# Patient Record
Sex: Female | Born: 1935 | Race: White | Hispanic: No | Smoking: Former smoker
Health system: Southern US, Community
[De-identification: ages and names within clinical notes are randomized; demographics above are authoritative.]

## PROBLEM LIST (undated history)

## (undated) DIAGNOSIS — K219 Gastro-esophageal reflux disease without esophagitis: Secondary | ICD-10-CM

## (undated) DIAGNOSIS — H539 Unspecified visual disturbance: Secondary | ICD-10-CM

## (undated) DIAGNOSIS — D509 Iron deficiency anemia, unspecified: Secondary | ICD-10-CM

## (undated) DIAGNOSIS — F329 Major depressive disorder, single episode, unspecified: Secondary | ICD-10-CM

## (undated) DIAGNOSIS — E559 Vitamin D deficiency, unspecified: Secondary | ICD-10-CM

## (undated) DIAGNOSIS — J309 Allergic rhinitis, unspecified: Secondary | ICD-10-CM

## (undated) DIAGNOSIS — F0390 Unspecified dementia without behavioral disturbance: Secondary | ICD-10-CM

## (undated) DIAGNOSIS — K922 Gastrointestinal hemorrhage, unspecified: Secondary | ICD-10-CM

## (undated) DIAGNOSIS — J449 Chronic obstructive pulmonary disease, unspecified: Secondary | ICD-10-CM

## (undated) DIAGNOSIS — F419 Anxiety disorder, unspecified: Secondary | ICD-10-CM

## (undated) DIAGNOSIS — E539 Vitamin B deficiency, unspecified: Secondary | ICD-10-CM

## (undated) DIAGNOSIS — K2970 Gastritis, unspecified, without bleeding: Secondary | ICD-10-CM

## (undated) DIAGNOSIS — I6200 Nontraumatic subdural hemorrhage, unspecified: Secondary | ICD-10-CM

## (undated) DIAGNOSIS — F32A Depression, unspecified: Secondary | ICD-10-CM

## (undated) DIAGNOSIS — K449 Diaphragmatic hernia without obstruction or gangrene: Secondary | ICD-10-CM

## (undated) DIAGNOSIS — R42 Dizziness and giddiness: Secondary | ICD-10-CM

## (undated) DIAGNOSIS — M810 Age-related osteoporosis without current pathological fracture: Secondary | ICD-10-CM

## (undated) DIAGNOSIS — D649 Anemia, unspecified: Secondary | ICD-10-CM

## (undated) DIAGNOSIS — M1990 Unspecified osteoarthritis, unspecified site: Secondary | ICD-10-CM

## (undated) DIAGNOSIS — I10 Essential (primary) hypertension: Secondary | ICD-10-CM

## (undated) DIAGNOSIS — K649 Unspecified hemorrhoids: Secondary | ICD-10-CM

## (undated) DIAGNOSIS — K5521 Angiodysplasia of colon with hemorrhage: Secondary | ICD-10-CM

## (undated) HISTORY — DX: Allergic rhinitis, unspecified: J30.9

## (undated) HISTORY — DX: Gastritis, unspecified, without bleeding: K29.70

## (undated) HISTORY — DX: Unspecified hemorrhoids: K64.9

## (undated) HISTORY — DX: Unspecified visual disturbance: H53.9

## (undated) HISTORY — DX: Unspecified dementia, unspecified severity, without behavioral disturbance, psychotic disturbance, mood disturbance, and anxiety: F03.90

## (undated) HISTORY — DX: Gastrointestinal hemorrhage, unspecified: K92.2

## (undated) HISTORY — DX: Vitamin D deficiency, unspecified: E55.9

## (undated) HISTORY — DX: Nontraumatic subdural hemorrhage, unspecified: I62.00

## (undated) HISTORY — DX: Anxiety disorder, unspecified: F41.9

## (undated) HISTORY — DX: Gastro-esophageal reflux disease without esophagitis: K21.9

## (undated) HISTORY — DX: Anemia, unspecified: D64.9

## (undated) HISTORY — DX: Angiodysplasia of colon with hemorrhage: K55.21

## (undated) HISTORY — DX: Dizziness and giddiness: R42

## (undated) HISTORY — DX: Age-related osteoporosis without current pathological fracture: M81.0

## (undated) HISTORY — DX: Vitamin B deficiency, unspecified: E53.9

## (undated) HISTORY — DX: Chronic obstructive pulmonary disease, unspecified: J44.9

---

## 1998-09-17 ENCOUNTER — Other Ambulatory Visit: Admission: RE | Admit: 1998-09-17 | Discharge: 1998-09-17 | Payer: Self-pay | Admitting: Internal Medicine

## 1998-12-26 ENCOUNTER — Ambulatory Visit (HOSPITAL_COMMUNITY): Admission: RE | Admit: 1998-12-26 | Discharge: 1998-12-26 | Payer: Self-pay | Admitting: Gastroenterology

## 1999-01-30 ENCOUNTER — Ambulatory Visit (HOSPITAL_COMMUNITY): Admission: RE | Admit: 1999-01-30 | Discharge: 1999-01-30 | Payer: Self-pay | Admitting: Gastroenterology

## 1999-01-30 ENCOUNTER — Encounter: Payer: Self-pay | Admitting: Gastroenterology

## 2000-03-05 ENCOUNTER — Encounter: Admission: RE | Admit: 2000-03-05 | Discharge: 2000-03-05 | Payer: Self-pay | Admitting: Family Medicine

## 2000-03-05 ENCOUNTER — Encounter: Payer: Self-pay | Admitting: Family Medicine

## 2000-03-10 ENCOUNTER — Encounter: Payer: Self-pay | Admitting: Family Medicine

## 2000-03-10 ENCOUNTER — Encounter: Admission: RE | Admit: 2000-03-10 | Discharge: 2000-03-10 | Payer: Self-pay | Admitting: Family Medicine

## 2000-10-14 ENCOUNTER — Encounter: Payer: Self-pay | Admitting: Family Medicine

## 2000-10-14 ENCOUNTER — Encounter: Admission: RE | Admit: 2000-10-14 | Discharge: 2000-10-14 | Payer: Self-pay | Admitting: Family Medicine

## 2000-10-15 ENCOUNTER — Encounter: Payer: Self-pay | Admitting: Family Medicine

## 2000-10-15 ENCOUNTER — Encounter: Admission: RE | Admit: 2000-10-15 | Discharge: 2000-10-15 | Payer: Self-pay | Admitting: Family Medicine

## 2001-02-16 ENCOUNTER — Encounter: Payer: Self-pay | Admitting: Family Medicine

## 2001-02-16 ENCOUNTER — Encounter: Admission: RE | Admit: 2001-02-16 | Discharge: 2001-02-16 | Payer: Self-pay | Admitting: Family Medicine

## 2001-05-05 ENCOUNTER — Encounter: Admission: RE | Admit: 2001-05-05 | Discharge: 2001-05-05 | Payer: Self-pay | Admitting: Family Medicine

## 2001-05-05 ENCOUNTER — Encounter: Payer: Self-pay | Admitting: Family Medicine

## 2001-07-23 ENCOUNTER — Encounter: Admission: RE | Admit: 2001-07-23 | Discharge: 2001-07-23 | Payer: Self-pay | Admitting: Family Medicine

## 2001-07-23 ENCOUNTER — Encounter: Payer: Self-pay | Admitting: Family Medicine

## 2001-11-11 ENCOUNTER — Encounter: Payer: Self-pay | Admitting: Family Medicine

## 2001-11-11 ENCOUNTER — Encounter: Admission: RE | Admit: 2001-11-11 | Discharge: 2001-11-11 | Payer: Self-pay | Admitting: Family Medicine

## 2002-11-09 ENCOUNTER — Ambulatory Visit (HOSPITAL_COMMUNITY): Admission: RE | Admit: 2002-11-09 | Discharge: 2002-11-09 | Payer: Self-pay | Admitting: Gastroenterology

## 2002-11-09 ENCOUNTER — Encounter (INDEPENDENT_AMBULATORY_CARE_PROVIDER_SITE_OTHER): Payer: Self-pay | Admitting: *Deleted

## 2003-11-20 ENCOUNTER — Encounter: Admission: RE | Admit: 2003-11-20 | Discharge: 2003-11-20 | Payer: Self-pay | Admitting: Family Medicine

## 2004-08-20 ENCOUNTER — Ambulatory Visit: Payer: Self-pay | Admitting: Family Medicine

## 2004-08-28 ENCOUNTER — Ambulatory Visit: Payer: Self-pay | Admitting: Family Medicine

## 2004-10-29 ENCOUNTER — Ambulatory Visit: Payer: Self-pay | Admitting: Family Medicine

## 2005-01-30 ENCOUNTER — Ambulatory Visit: Payer: Self-pay | Admitting: Family Medicine

## 2005-04-25 ENCOUNTER — Ambulatory Visit: Payer: Self-pay | Admitting: Family Medicine

## 2005-05-08 ENCOUNTER — Ambulatory Visit: Payer: Self-pay | Admitting: Family Medicine

## 2005-06-14 ENCOUNTER — Inpatient Hospital Stay (HOSPITAL_COMMUNITY): Admission: EM | Admit: 2005-06-14 | Discharge: 2005-06-15 | Payer: Self-pay | Admitting: Emergency Medicine

## 2005-07-03 ENCOUNTER — Ambulatory Visit: Payer: Self-pay | Admitting: Family Medicine

## 2005-07-22 ENCOUNTER — Ambulatory Visit: Payer: Self-pay | Admitting: Family Medicine

## 2005-07-31 ENCOUNTER — Ambulatory Visit: Payer: Self-pay | Admitting: Family Medicine

## 2005-11-12 ENCOUNTER — Ambulatory Visit: Payer: Self-pay | Admitting: Family Medicine

## 2006-01-12 ENCOUNTER — Ambulatory Visit: Payer: Self-pay | Admitting: Family Medicine

## 2006-01-26 ENCOUNTER — Ambulatory Visit: Payer: Self-pay | Admitting: Family Medicine

## 2006-02-13 ENCOUNTER — Ambulatory Visit: Payer: Self-pay | Admitting: Family Medicine

## 2006-03-12 ENCOUNTER — Ambulatory Visit: Payer: Self-pay | Admitting: Family Medicine

## 2006-06-15 ENCOUNTER — Ambulatory Visit: Payer: Self-pay | Admitting: Family Medicine

## 2006-06-17 ENCOUNTER — Encounter (HOSPITAL_COMMUNITY): Admission: RE | Admit: 2006-06-17 | Discharge: 2006-09-15 | Payer: Self-pay | Admitting: Family Medicine

## 2006-06-24 ENCOUNTER — Ambulatory Visit: Payer: Self-pay | Admitting: Oncology

## 2006-07-02 LAB — CBC & DIFF AND RETIC
EOS%: 3.9 % (ref 0.0–7.0)
LYMPH%: 20.1 % (ref 14.0–48.0)
MCH: 30.5 pg (ref 26.0–34.0)
MCHC: 33.2 g/dL (ref 32.0–36.0)
MCV: 91.9 fL (ref 81.0–101.0)
MONO%: 10 % (ref 0.0–13.0)
NEUT#: 4.5 10*3/uL (ref 1.5–6.5)
RBC: 4.09 10*6/uL (ref 3.70–5.32)
RDW: 14.1 % (ref 11.3–14.5)
RETIC #: 49.1 10*3/uL (ref 19.7–115.1)
Retic %: 1.2 % (ref 0.4–2.3)

## 2006-07-02 LAB — MORPHOLOGY: RBC Comments: NORMAL

## 2006-07-03 LAB — IRON AND TIBC
%SAT: 31 % (ref 20–55)
Iron: 101 ug/dL (ref 42–145)

## 2006-07-03 LAB — COMPREHENSIVE METABOLIC PANEL
CO2: 25 mEq/L (ref 19–32)
Calcium: 8.7 mg/dL (ref 8.4–10.5)
Chloride: 105 mEq/L (ref 96–112)
Creatinine, Ser: 0.86 mg/dL (ref 0.40–1.20)
Glucose, Bld: 94 mg/dL (ref 70–99)
Total Bilirubin: 0.3 mg/dL (ref 0.3–1.2)
Total Protein: 6.9 g/dL (ref 6.0–8.3)

## 2006-07-27 ENCOUNTER — Ambulatory Visit: Payer: Self-pay | Admitting: Family Medicine

## 2006-08-24 ENCOUNTER — Ambulatory Visit: Payer: Self-pay | Admitting: Oncology

## 2006-08-27 LAB — CBC & DIFF AND RETIC
Basophils Absolute: 0 10*3/uL (ref 0.0–0.1)
Eosinophils Absolute: 0.2 10*3/uL (ref 0.0–0.5)
HCT: 35.9 % (ref 34.8–46.6)
HGB: 12.1 g/dL (ref 11.6–15.9)
LYMPH%: 21.7 % (ref 14.0–48.0)
MCV: 90.5 fL (ref 81.0–101.0)
MONO%: 10.8 % (ref 0.0–13.0)
NEUT#: 4.3 10*3/uL (ref 1.5–6.5)
Platelets: 363 10*3/uL (ref 145–400)
RETIC #: 65.9 10*3/uL (ref 19.7–115.1)

## 2006-08-27 LAB — COMPREHENSIVE METABOLIC PANEL
Albumin: 4.1 g/dL (ref 3.5–5.2)
Alkaline Phosphatase: 80 U/L (ref 39–117)
CO2: 29 mEq/L (ref 19–32)
Glucose, Bld: 99 mg/dL (ref 70–99)
Potassium: 4.3 mEq/L (ref 3.5–5.3)
Sodium: 143 mEq/L (ref 135–145)
Total Protein: 6.6 g/dL (ref 6.0–8.3)

## 2006-09-18 LAB — CBC WITH DIFFERENTIAL/PLATELET
BASO%: 1 % (ref 0.0–2.0)
EOS%: 3.1 % (ref 0.0–7.0)
Eosinophils Absolute: 0.3 10*3/uL (ref 0.0–0.5)
MCHC: 33.2 g/dL (ref 32.0–36.0)
MCV: 89.3 fL (ref 81.0–101.0)
MONO%: 8.9 % (ref 0.0–13.0)
NEUT#: 6.6 10*3/uL — ABNORMAL HIGH (ref 1.5–6.5)
RBC: 3.94 10*6/uL (ref 3.70–5.32)
RDW: 11.9 % (ref 11.3–14.5)

## 2006-09-28 ENCOUNTER — Ambulatory Visit: Payer: Self-pay | Admitting: Family Medicine

## 2006-10-26 ENCOUNTER — Ambulatory Visit: Payer: Self-pay | Admitting: Oncology

## 2006-10-26 LAB — MORPHOLOGY
PLT EST: INCREASED
RBC Comments: 2

## 2006-10-26 LAB — CHCC SMEAR

## 2006-10-26 LAB — COMPREHENSIVE METABOLIC PANEL
ALT: 16 U/L (ref 0–35)
AST: 18 U/L (ref 0–37)
Albumin: 4 g/dL (ref 3.5–5.2)
Alkaline Phosphatase: 83 U/L (ref 39–117)
Calcium: 8.9 mg/dL (ref 8.4–10.5)
Chloride: 106 mEq/L (ref 96–112)
Potassium: 4.3 mEq/L (ref 3.5–5.3)
Sodium: 141 mEq/L (ref 135–145)
Total Protein: 6.6 g/dL (ref 6.0–8.3)

## 2006-10-26 LAB — CBC & DIFF AND RETIC
Basophils Absolute: 0 10*3/uL (ref 0.0–0.1)
Eosinophils Absolute: 0.1 10*3/uL (ref 0.0–0.5)
HCT: 30.9 % — ABNORMAL LOW (ref 34.8–46.6)
HGB: 10.5 g/dL — ABNORMAL LOW (ref 11.6–15.9)
LYMPH%: 22.1 % (ref 14.0–48.0)
MCV: 91 fL (ref 81.0–101.0)
MONO#: 0.6 10*3/uL (ref 0.1–0.9)
MONO%: 9.6 % (ref 0.0–13.0)
NEUT#: 4.3 10*3/uL (ref 1.5–6.5)
NEUT%: 66 % (ref 39.6–76.8)
Platelets: 437 10*3/uL — ABNORMAL HIGH (ref 145–400)
RBC: 3.39 10*6/uL — ABNORMAL LOW (ref 3.70–5.32)

## 2006-10-26 LAB — LACTATE DEHYDROGENASE: LDH: 154 U/L (ref 94–250)

## 2006-11-04 ENCOUNTER — Encounter (INDEPENDENT_AMBULATORY_CARE_PROVIDER_SITE_OTHER): Payer: Self-pay | Admitting: Specialist

## 2006-11-04 ENCOUNTER — Other Ambulatory Visit: Admission: RE | Admit: 2006-11-04 | Discharge: 2006-11-04 | Payer: Self-pay | Admitting: Oncology

## 2006-11-04 LAB — CBC WITH DIFFERENTIAL/PLATELET
BASO%: 0.9 % (ref 0.0–2.0)
Eosinophils Absolute: 0.2 10*3/uL (ref 0.0–0.5)
HCT: 29.2 % — ABNORMAL LOW (ref 34.8–46.6)
LYMPH%: 19.3 % (ref 14.0–48.0)
MONO#: 0.6 10*3/uL (ref 0.1–0.9)
NEUT#: 4.2 10*3/uL (ref 1.5–6.5)
NEUT%: 66.2 % (ref 39.6–76.8)
Platelets: 382 10*3/uL (ref 145–400)
WBC: 6.3 10*3/uL (ref 3.9–10.0)
lymph#: 1.2 10*3/uL (ref 0.9–3.3)

## 2006-11-11 LAB — CBC WITH DIFFERENTIAL/PLATELET
BASO%: 0.6 % (ref 0.0–2.0)
Basophils Absolute: 0 10*3/uL (ref 0.0–0.1)
HCT: 25.7 % — ABNORMAL LOW (ref 34.8–46.6)
HGB: 8.6 g/dL — ABNORMAL LOW (ref 11.6–15.9)
MONO#: 0.7 10*3/uL (ref 0.1–0.9)
NEUT%: 66.4 % (ref 39.6–76.8)
RDW: 16.1 % — ABNORMAL HIGH (ref 11.3–14.5)
WBC: 8 10*3/uL (ref 3.9–10.0)
lymph#: 1.8 10*3/uL (ref 0.9–3.3)

## 2006-11-25 LAB — CBC WITH DIFFERENTIAL/PLATELET
Basophils Absolute: 0 10*3/uL (ref 0.0–0.1)
EOS%: 2.1 % (ref 0.0–7.0)
Eosinophils Absolute: 0.1 10*3/uL (ref 0.0–0.5)
HCT: 33.2 % — ABNORMAL LOW (ref 34.8–46.6)
HGB: 10.9 g/dL — ABNORMAL LOW (ref 11.6–15.9)
MCH: 31 pg (ref 26.0–34.0)
NEUT#: 4.4 10*3/uL (ref 1.5–6.5)
NEUT%: 69.7 % (ref 39.6–76.8)
lymph#: 1.2 10*3/uL (ref 0.9–3.3)

## 2006-11-27 LAB — IRON AND TIBC
Iron: 33 ug/dL — ABNORMAL LOW (ref 42–145)
TIBC: 255 ug/dL (ref 250–470)
UIBC: 222 ug/dL

## 2006-11-27 LAB — COMPREHENSIVE METABOLIC PANEL
AST: 18 U/L (ref 0–37)
Albumin: 4 g/dL (ref 3.5–5.2)
BUN: 11 mg/dL (ref 6–23)
CO2: 23 mEq/L (ref 19–32)
Calcium: 8.7 mg/dL (ref 8.4–10.5)
Chloride: 106 mEq/L (ref 96–112)
Creatinine, Ser: 0.77 mg/dL (ref 0.40–1.20)
Glucose, Bld: 87 mg/dL (ref 70–99)

## 2006-11-27 LAB — PROTEIN ELECTROPHORESIS, SERUM
Beta 2: 6.3 % (ref 3.2–6.5)
Beta Globulin: 5.7 % (ref 4.7–7.2)
Gamma Globulin: 12.2 % (ref 11.1–18.8)
Total Protein, Serum Electrophoresis: 6.5 g/dL (ref 6.0–8.3)

## 2006-11-27 LAB — LACTATE DEHYDROGENASE: LDH: 139 U/L (ref 94–250)

## 2006-12-09 ENCOUNTER — Ambulatory Visit: Payer: Self-pay | Admitting: Oncology

## 2006-12-09 LAB — CBC WITH DIFFERENTIAL/PLATELET
EOS%: 3 % (ref 0.0–7.0)
LYMPH%: 19.2 % (ref 14.0–48.0)
MCH: 30.5 pg (ref 26.0–34.0)
MCHC: 32.5 g/dL (ref 32.0–36.0)
MCV: 93.7 fL (ref 81.0–101.0)
MONO%: 10.9 % (ref 0.0–13.0)
Platelets: 351 10*3/uL (ref 145–400)
RBC: 4.25 10*6/uL (ref 3.70–5.32)
RDW: 14.5 % (ref 11.3–14.5)

## 2006-12-10 ENCOUNTER — Ambulatory Visit: Payer: Self-pay | Admitting: Family Medicine

## 2006-12-23 LAB — CBC WITH DIFFERENTIAL/PLATELET
BASO%: 1.4 % (ref 0.0–2.0)
Eosinophils Absolute: 0.3 10*3/uL (ref 0.0–0.5)
LYMPH%: 18.1 % (ref 14.0–48.0)
MCHC: 32.1 g/dL (ref 32.0–36.0)
MONO#: 0.8 10*3/uL (ref 0.1–0.9)
MONO%: 10 % (ref 0.0–13.0)
NEUT#: 5.2 10*3/uL (ref 1.5–6.5)
RBC: 4.77 10*6/uL (ref 3.70–5.32)
RDW: 12 % (ref 11.3–14.5)
WBC: 7.9 10*3/uL (ref 3.9–10.0)

## 2006-12-24 ENCOUNTER — Ambulatory Visit: Payer: Self-pay | Admitting: Family Medicine

## 2007-01-06 LAB — CBC WITH DIFFERENTIAL/PLATELET
BASO%: 0.7 % (ref 0.0–2.0)
Eosinophils Absolute: 0.3 10*3/uL (ref 0.0–0.5)
LYMPH%: 19.6 % (ref 14.0–48.0)
MCHC: 33.6 g/dL (ref 32.0–36.0)
MONO#: 0.7 10*3/uL (ref 0.1–0.9)
NEUT#: 4.9 10*3/uL (ref 1.5–6.5)
Platelets: 310 10*3/uL (ref 145–400)
RBC: 4.54 10*6/uL (ref 3.70–5.32)
WBC: 7.5 10*3/uL (ref 3.9–10.0)
lymph#: 1.5 10*3/uL (ref 0.9–3.3)

## 2007-01-20 LAB — CBC WITH DIFFERENTIAL/PLATELET
BASO%: 0.4 % (ref 0.0–2.0)
Basophils Absolute: 0 10*3/uL (ref 0.0–0.1)
Eosinophils Absolute: 0.2 10*3/uL (ref 0.0–0.5)
HCT: 40.2 % (ref 34.8–46.6)
HGB: 13.6 g/dL (ref 11.6–15.9)
LYMPH%: 22.2 % (ref 14.0–48.0)
MONO#: 0.7 10*3/uL (ref 0.1–0.9)
NEUT#: 4.6 10*3/uL (ref 1.5–6.5)
NEUT%: 65.2 % (ref 39.6–76.8)
Platelets: 357 10*3/uL (ref 145–400)
WBC: 7.1 10*3/uL (ref 3.9–10.0)
lymph#: 1.6 10*3/uL (ref 0.9–3.3)

## 2007-02-01 ENCOUNTER — Ambulatory Visit: Payer: Self-pay | Admitting: Oncology

## 2007-02-03 LAB — CBC WITH DIFFERENTIAL/PLATELET
Basophils Absolute: 0.1 10*3/uL (ref 0.0–0.1)
EOS%: 2.5 % (ref 0.0–7.0)
Eosinophils Absolute: 0.2 10*3/uL (ref 0.0–0.5)
HCT: 39 % (ref 34.8–46.6)
HGB: 13.1 g/dL (ref 11.6–15.9)
MCH: 29 pg (ref 26.0–34.0)
NEUT#: 5.4 10*3/uL (ref 1.5–6.5)
NEUT%: 66.7 % (ref 39.6–76.8)
RDW: 13.8 % (ref 11.3–14.5)
lymph#: 1.7 10*3/uL (ref 0.9–3.3)

## 2007-02-08 ENCOUNTER — Ambulatory Visit: Payer: Self-pay | Admitting: Family Medicine

## 2007-02-16 ENCOUNTER — Ambulatory Visit: Payer: Self-pay | Admitting: Family Medicine

## 2007-02-17 LAB — CBC WITH DIFFERENTIAL/PLATELET
Basophils Absolute: 0 10*3/uL (ref 0.0–0.1)
EOS%: 1.7 % (ref 0.0–7.0)
Eosinophils Absolute: 0.2 10*3/uL (ref 0.0–0.5)
HCT: 37.1 % (ref 34.8–46.6)
HGB: 12.5 g/dL (ref 11.6–15.9)
LYMPH%: 20.4 % (ref 14.0–48.0)
MCH: 29 pg (ref 26.0–34.0)
MCV: 85.8 fL (ref 81.0–101.0)
MONO%: 8.3 % (ref 0.0–13.0)
NEUT#: 7 10*3/uL — ABNORMAL HIGH (ref 1.5–6.5)
NEUT%: 69.2 % (ref 39.6–76.8)
Platelets: 449 10*3/uL — ABNORMAL HIGH (ref 145–400)

## 2007-02-25 LAB — COMPREHENSIVE METABOLIC PANEL
ALT: 10 U/L (ref 0–35)
CO2: 24 mEq/L (ref 19–32)
Calcium: 8.7 mg/dL (ref 8.4–10.5)
Chloride: 105 mEq/L (ref 96–112)
Creatinine, Ser: 0.88 mg/dL (ref 0.40–1.20)
Glucose, Bld: 81 mg/dL (ref 70–99)
Total Protein: 7.2 g/dL (ref 6.0–8.3)

## 2007-02-25 LAB — CBC WITH DIFFERENTIAL/PLATELET
EOS%: 2.8 % (ref 0.0–7.0)
Eosinophils Absolute: 0.2 10*3/uL (ref 0.0–0.5)
LYMPH%: 27.5 % (ref 14.0–48.0)
MCH: 29 pg (ref 26.0–34.0)
MCHC: 33.8 g/dL (ref 32.0–36.0)
MCV: 85.7 fL (ref 81.0–101.0)
MONO%: 10.8 % (ref 0.0–13.0)
NEUT#: 4.2 10*3/uL (ref 1.5–6.5)
Platelets: 430 10*3/uL — ABNORMAL HIGH (ref 145–400)
RBC: 4.08 10*6/uL (ref 3.70–5.32)
RDW: 15.2 % — ABNORMAL HIGH (ref 11.3–14.5)

## 2007-02-25 LAB — FERRITIN: Ferritin: 212 ng/mL (ref 10–291)

## 2007-02-25 LAB — IRON AND TIBC
%SAT: 17 % — ABNORMAL LOW (ref 20–55)
TIBC: 248 ug/dL — ABNORMAL LOW (ref 250–470)

## 2007-02-25 LAB — MORPHOLOGY

## 2007-02-25 LAB — CHCC SMEAR

## 2007-03-03 LAB — CBC WITH DIFFERENTIAL/PLATELET
BASO%: 0.4 % (ref 0.0–2.0)
Basophils Absolute: 0 10*3/uL (ref 0.0–0.1)
EOS%: 2.3 % (ref 0.0–7.0)
HGB: 11.7 g/dL (ref 11.6–15.9)
MCH: 28.9 pg (ref 26.0–34.0)
RDW: 15.4 % — ABNORMAL HIGH (ref 11.3–14.5)
lymph#: 1.7 10*3/uL (ref 0.9–3.3)

## 2007-03-17 LAB — CBC WITH DIFFERENTIAL/PLATELET
Basophils Absolute: 0.1 10*3/uL (ref 0.0–0.1)
Eosinophils Absolute: 0.3 10*3/uL (ref 0.0–0.5)
HGB: 11.3 g/dL — ABNORMAL LOW (ref 11.6–15.9)
MCV: 87.8 fL (ref 81.0–101.0)
NEUT#: 5 10*3/uL (ref 1.5–6.5)
RDW: 16.4 % — ABNORMAL HIGH (ref 11.3–14.5)
lymph#: 1.7 10*3/uL (ref 0.9–3.3)

## 2007-03-29 ENCOUNTER — Ambulatory Visit: Payer: Self-pay | Admitting: Oncology

## 2007-03-31 LAB — CBC WITH DIFFERENTIAL/PLATELET
BASO%: 0.4 % (ref 0.0–2.0)
Eosinophils Absolute: 0.2 10*3/uL (ref 0.0–0.5)
MCHC: 33.8 g/dL (ref 32.0–36.0)
MONO#: 0.9 10*3/uL (ref 0.1–0.9)
NEUT#: 4.9 10*3/uL (ref 1.5–6.5)
RBC: 4.33 10*6/uL (ref 3.70–5.32)
RDW: 19.2 % — ABNORMAL HIGH (ref 11.3–14.5)
WBC: 7.4 10*3/uL (ref 3.9–10.0)
lymph#: 1.4 10*3/uL (ref 0.9–3.3)

## 2007-04-28 LAB — CBC WITH DIFFERENTIAL/PLATELET
BASO%: 0.3 % (ref 0.0–2.0)
Basophils Absolute: 0 10*3/uL (ref 0.0–0.1)
Eosinophils Absolute: 0.2 10*3/uL (ref 0.0–0.5)
HCT: 39.7 % (ref 34.8–46.6)
HGB: 13.5 g/dL (ref 11.6–15.9)
MCHC: 34.1 g/dL (ref 32.0–36.0)
MONO#: 0.6 10*3/uL (ref 0.1–0.9)
NEUT#: 8.4 10*3/uL — ABNORMAL HIGH (ref 1.5–6.5)
NEUT%: 78.9 % — ABNORMAL HIGH (ref 39.6–76.8)
WBC: 10.6 10*3/uL — ABNORMAL HIGH (ref 3.9–10.0)
lymph#: 1.4 10*3/uL (ref 0.9–3.3)

## 2007-05-21 ENCOUNTER — Ambulatory Visit: Payer: Self-pay | Admitting: Oncology

## 2007-05-26 LAB — CBC WITH DIFFERENTIAL/PLATELET
Basophils Absolute: 0 10*3/uL (ref 0.0–0.1)
EOS%: 2.5 % (ref 0.0–7.0)
HCT: 38.8 % (ref 34.8–46.6)
HGB: 13.2 g/dL (ref 11.6–15.9)
MCH: 30.6 pg (ref 26.0–34.0)
NEUT%: 69.3 % (ref 39.6–76.8)
Platelets: 366 10*3/uL (ref 145–400)
lymph#: 1.6 10*3/uL (ref 0.9–3.3)

## 2007-06-23 LAB — CBC WITH DIFFERENTIAL/PLATELET
Basophils Absolute: 0 10*3/uL (ref 0.0–0.1)
EOS%: 6.4 % (ref 0.0–7.0)
HCT: 35.9 % (ref 34.8–46.6)
HGB: 12.4 g/dL (ref 11.6–15.9)
MCH: 30.9 pg (ref 26.0–34.0)
MCV: 89.6 fL (ref 81.0–101.0)
MONO%: 8.4 % (ref 0.0–13.0)
NEUT%: 68.8 % (ref 39.6–76.8)
RDW: 13.6 % (ref 11.3–14.5)

## 2007-07-19 ENCOUNTER — Ambulatory Visit: Payer: Self-pay | Admitting: Oncology

## 2007-07-19 ENCOUNTER — Emergency Department (HOSPITAL_COMMUNITY): Admission: EM | Admit: 2007-07-19 | Discharge: 2007-07-19 | Payer: Self-pay | Admitting: *Deleted

## 2007-07-21 LAB — CBC WITH DIFFERENTIAL/PLATELET
EOS%: 2.2 % (ref 0.0–7.0)
MCH: 30.9 pg (ref 26.0–34.0)
MCHC: 34 g/dL (ref 32.0–36.0)
MCV: 90.7 fL (ref 81.0–101.0)
MONO%: 9 % (ref 0.0–13.0)
RBC: 4.26 10*6/uL (ref 3.70–5.32)
RDW: 14.9 % — ABNORMAL HIGH (ref 11.3–14.5)

## 2007-08-18 LAB — CBC WITH DIFFERENTIAL/PLATELET
Basophils Absolute: 0 10*3/uL (ref 0.0–0.1)
Eosinophils Absolute: 0.2 10*3/uL (ref 0.0–0.5)
HGB: 12.6 g/dL (ref 11.6–15.9)
MONO#: 0.7 10*3/uL (ref 0.1–0.9)
NEUT#: 4.9 10*3/uL (ref 1.5–6.5)
RBC: 4.03 10*6/uL (ref 3.70–5.32)
RDW: 14 % (ref 11.3–14.5)
WBC: 7.4 10*3/uL (ref 3.9–10.0)
lymph#: 1.5 10*3/uL (ref 0.9–3.3)

## 2007-08-18 LAB — CHCC SMEAR

## 2007-09-14 ENCOUNTER — Ambulatory Visit: Payer: Self-pay | Admitting: Oncology

## 2007-09-20 LAB — CBC & DIFF AND RETIC
BASO%: 0.4 % (ref 0.0–2.0)
Eosinophils Absolute: 0.1 10*3/uL (ref 0.0–0.5)
HCT: 37.3 % (ref 34.8–46.6)
IRF: 0.39 — ABNORMAL HIGH (ref 0.130–0.330)
LYMPH%: 15.6 % (ref 14.0–48.0)
MONO#: 0.7 10*3/uL (ref 0.1–0.9)
NEUT#: 6.3 10*3/uL (ref 1.5–6.5)
NEUT%: 74.2 % (ref 39.6–76.8)
Platelets: 406 10*3/uL — ABNORMAL HIGH (ref 145–400)
Retic %: 1.1 % (ref 0.4–2.3)
WBC: 8.5 10*3/uL (ref 3.9–10.0)
lymph#: 1.3 10*3/uL (ref 0.9–3.3)

## 2007-09-20 LAB — CHCC SMEAR

## 2007-09-21 LAB — COMPREHENSIVE METABOLIC PANEL WITH GFR
ALT: 12 U/L (ref 0–35)
AST: 17 U/L (ref 0–37)
Albumin: 4 g/dL (ref 3.5–5.2)
Alkaline Phosphatase: 89 U/L (ref 39–117)
BUN: 11 mg/dL (ref 6–23)
CO2: 24 meq/L (ref 19–32)
Calcium: 8.9 mg/dL (ref 8.4–10.5)
Chloride: 105 meq/L (ref 96–112)
Creatinine, Ser: 0.92 mg/dL (ref 0.40–1.20)
Glucose, Bld: 127 mg/dL — ABNORMAL HIGH (ref 70–99)
Potassium: 3.8 meq/L (ref 3.5–5.3)
Sodium: 142 meq/L (ref 135–145)
Total Bilirubin: 0.3 mg/dL (ref 0.3–1.2)
Total Protein: 6.6 g/dL (ref 6.0–8.3)

## 2007-09-21 LAB — FOLATE RBC: RBC Folate: 1852 ng/mL — ABNORMAL HIGH (ref 180–600)

## 2007-09-21 LAB — IRON AND TIBC
%SAT: 10 % — ABNORMAL LOW (ref 20–55)
TIBC: 273 ug/dL (ref 250–470)

## 2007-09-21 LAB — LACTATE DEHYDROGENASE: LDH: 148 U/L (ref 94–250)

## 2007-09-21 LAB — FERRITIN: Ferritin: 76 ng/mL (ref 10–291)

## 2007-09-23 HISTORY — PX: ORIF RADIAL FRACTURE: SHX5113

## 2007-09-24 ENCOUNTER — Ambulatory Visit (HOSPITAL_BASED_OUTPATIENT_CLINIC_OR_DEPARTMENT_OTHER): Admission: RE | Admit: 2007-09-24 | Discharge: 2007-09-24 | Payer: Self-pay | Admitting: Orthopedic Surgery

## 2007-09-27 ENCOUNTER — Ambulatory Visit: Payer: Self-pay | Admitting: Oncology

## 2007-12-16 ENCOUNTER — Ambulatory Visit: Payer: Self-pay | Admitting: Oncology

## 2007-12-20 LAB — CBC & DIFF AND RETIC
Basophils Absolute: 0 10*3/uL (ref 0.0–0.1)
EOS%: 2.5 % (ref 0.0–7.0)
HCT: 37.4 % (ref 34.8–46.6)
HGB: 12.8 g/dL (ref 11.6–15.9)
MCH: 30.1 pg (ref 26.0–34.0)
MCV: 88 fL (ref 81.0–101.0)
MONO%: 8.1 % (ref 0.0–13.0)
NEUT%: 69.3 % (ref 39.6–76.8)

## 2008-01-19 ENCOUNTER — Encounter: Admission: RE | Admit: 2008-01-19 | Discharge: 2008-01-19 | Payer: Self-pay | Admitting: Family Medicine

## 2008-03-16 ENCOUNTER — Ambulatory Visit: Payer: Self-pay | Admitting: Oncology

## 2008-03-20 LAB — CBC & DIFF AND RETIC
BASO%: 0.7 % (ref 0.0–2.0)
Basophils Absolute: 0 10*3/uL (ref 0.0–0.1)
EOS%: 3.1 % (ref 0.0–7.0)
HGB: 13.3 g/dL (ref 11.6–15.9)
MCH: 29.8 pg (ref 26.0–34.0)
MCHC: 33.3 g/dL (ref 32.0–36.0)
MCV: 89.6 fL (ref 81.0–101.0)
MONO%: 8.7 % (ref 0.0–13.0)
RDW: 13.4 % (ref 11.3–14.5)
RETIC #: 69.9 10*3/uL (ref 19.7–115.1)

## 2008-06-15 ENCOUNTER — Ambulatory Visit: Payer: Self-pay | Admitting: Oncology

## 2008-06-19 LAB — CBC & DIFF AND RETIC
BASO%: 0.6 % (ref 0.0–2.0)
Basophils Absolute: 0.1 10*3/uL (ref 0.0–0.1)
EOS%: 2.6 % (ref 0.0–7.0)
Eosinophils Absolute: 0.2 10*3/uL (ref 0.0–0.5)
HCT: 39.2 % (ref 34.8–46.6)
HGB: 13.3 g/dL (ref 11.6–15.9)
IRF: 0.39 — ABNORMAL HIGH (ref 0.130–0.330)
LYMPH%: 20.9 % (ref 14.0–48.0)
MCH: 31.1 pg (ref 26.0–34.0)
MCHC: 34 g/dL (ref 32.0–36.0)
MCV: 91.4 fL (ref 81.0–101.0)
MONO#: 0.9 10*3/uL (ref 0.1–0.9)
MONO%: 10.1 % (ref 0.0–13.0)
NEUT#: 5.9 10*3/uL (ref 1.5–6.5)
NEUT%: 65.8 % (ref 39.6–76.8)
Platelets: 342 10*3/uL (ref 145–400)
RBC: 4.28 10*6/uL (ref 3.70–5.32)
RDW: 13.4 % (ref 11.3–14.5)
RETIC #: 67.2 10*3/uL (ref 19.7–115.1)
Retic %: 1.6 % (ref 0.4–2.3)
WBC: 9 10*3/uL (ref 3.9–10.0)
lymph#: 1.9 10*3/uL (ref 0.9–3.3)

## 2008-08-15 ENCOUNTER — Ambulatory Visit: Payer: Self-pay | Admitting: Oncology

## 2008-09-18 LAB — CBC & DIFF AND RETIC
BASO%: 0.6 % (ref 0.0–2.0)
Basophils Absolute: 0 10*3/uL (ref 0.0–0.1)
EOS%: 2.6 % (ref 0.0–7.0)
HCT: 38.8 % (ref 34.8–46.6)
HGB: 13.1 g/dL (ref 11.6–15.9)
IRF: 0.34 — ABNORMAL HIGH (ref 0.130–0.330)
MCH: 31.5 pg (ref 26.0–34.0)
MONO#: 0.6 10*3/uL (ref 0.1–0.9)
NEUT%: 64.7 % (ref 39.6–76.8)
RDW: 13.3 % (ref 11.3–14.5)
RETIC #: 55.9 10*3/uL (ref 19.7–115.1)
WBC: 7.2 10*3/uL (ref 3.9–10.0)
lymph#: 1.7 10*3/uL (ref 0.9–3.3)

## 2008-09-18 LAB — COMPREHENSIVE METABOLIC PANEL
BUN: 12 mg/dL (ref 6–23)
CO2: 25 mEq/L (ref 19–32)
Calcium: 8.4 mg/dL (ref 8.4–10.5)
Chloride: 105 mEq/L (ref 96–112)
Creatinine, Ser: 0.87 mg/dL (ref 0.40–1.20)
Total Bilirubin: 0.3 mg/dL (ref 0.3–1.2)

## 2008-09-18 LAB — IRON AND TIBC
%SAT: 14 % — ABNORMAL LOW (ref 20–55)
Iron: 38 ug/dL — ABNORMAL LOW (ref 42–145)
TIBC: 264 ug/dL (ref 250–470)
UIBC: 226 ug/dL

## 2008-09-18 LAB — MORPHOLOGY

## 2008-09-18 LAB — FERRITIN: Ferritin: 55 ng/mL (ref 10–291)

## 2008-09-28 ENCOUNTER — Ambulatory Visit: Payer: Self-pay | Admitting: Oncology

## 2011-02-04 NOTE — Op Note (Signed)
[name], [name] [name]              ACCOUNT NO.:  [id number]   MEDICAL RECORD NO.:  [id number]          PATIENT TYPE:  AMB   LOCATION:  DSC                          FACILITY:  [facility]   PHYSICIAN:  Rodney A. Mortenson, M.D.DATE OF BIRTH:  02-05-1929   DATE OF PROCEDURE:  09/24/2007  DATE OF DISCHARGE:                               OPERATIVE REPORT   PREOPERATIVE DIAGNOSIS:  Unstable fracture, right distal radius.   POSTOPERATIVE DIAGNOSIS:  Unstable fracture, right distal radius.   OPERATION:  Open reduction internal fixation using a [employer]  volar plate, right wrist   SURGEON:  Rodney A. Chaney Malling, M.D.   ANESTHESIA:  General.   PROCEDURE:  The patient was placed on the operating table in a supine  position with a pneumatic tourniquet about the right upper arm.  The  right upper extremity was prepped with DuraPrep and draped out in the  usual manner.  The arm was then wrapped out in an Esmarch and the  tourniquet was elevated.  An incision was made on the volar surface of  the wrist following the course of flexor carpi radialis.  A self-  retaining retractor was put in place.  The sheath of the flexor carpi  radialis was opened on the volar surface. The flexor carpi radialis was  displaced to the radial side to protect the median nerve.  The median  nerve was identified, isolated, and protected throughout procedure.  An  incision was made through the dorsal aspect of the flexor carpi radialis  tendon sheath exposing quadratus. The quadratus was released off the  ulnar border of the distal radius and swept to the ulnar side.  A dull  self-retaining retractor put in place.  The distal radius was seen very  nicely, the fracture site could be seen.  The fracture could be reduced  quite easily.  The fracture was reduced in an anatomic position and a  pin was placed through the radial styloid into the proximal radius which  stabilized the fracture.  The appropriate sized volar  plate was then  selected and placed over the exposed surface of the distal radius out to  the watershed line.  A Kirschner wire was put in place.  X-rays were  taken and a good position of the plate was achieved.  A drill hole was  placed in the oblique hole and the volar plate and a 10 mm screw was  inserted.  Then, each hole in the distal radial plate was drilled, the  sleeve removed, the depth gauge was used, and the appropriate length  locking head was then inserted sequentially.  The final locking plate  was placed in the radial styloid.  Throughout procedure, x-rays were  taken and a good position and fixation was achieved.  I was quite  pleased with the final reduction, stability, and position of the plate  and the pegs.  The quadratus was reattached using 2-0 Vicryl.  A 4-0  nylon was then used to close the skin.  Sterile dressings were applied  and a wrist immobilizer applied and the patient returned to the recovery  room in excellent condition.  Technically, this went extremely well.   DISPOSITION:  1. Usual postop instructions.  2. Vicodin for pain.  3. To my office on Wednesday.  Patient to call to make component.      Rodney A. Chaney Malling, M.D.  Electronically Signed     RAM/MEDQ  D:  09/24/2007  T:  09/24/2007  Job:  [id number]

## 2011-02-07 NOTE — H&P (Signed)
NAMEMALERIE, EAKINS NO.:  [id number]   MEDICAL RECORD NO.:  [id number]          PATIENT TYPE:  INP   LOCATION:  2008                         FACILITY:  [facility]   PHYSICIAN:  Francisca December, M.D.  DATE OF BIRTH:  25-Jan-1929   DATE OF ADMISSION:  06/14/2005  DATE OF DISCHARGE:  06/15/2005                                HISTORY & PHYSICAL   REASON FOR ADMISSION:  Chest pain.   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  It was associated with diaphoresis and nausea and lasted about  45 minutes.  There was radiation of the discomfort up into the haw and teeth  bilaterally.  Eventually her family convinced her call EMS. While they were  en route, the discomfort began to resolve.  It resolved completely upon  their arrival and had the patient on O2 by nasal cannula.  No nitroglycerin  was given.  She did receive 4 baby aspirin and was transported to [facility]  Emergency Room.  She was pain free by the time she arrived here at 10:56.  She was also without discomfort at the time of my evaluation at 1500.   Her cardiac risk factors are age and hypertension.   PAST MEDICAL HISTORY:  1.  Hypertension.  2.  GERD, hiatal hernia.  3.  History of iron-deficiency anemia .  4.  DJD of the hands.  5.  Depression.   PAST SURGICAL HISTORY:  None significant.   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  Lives  independently.  One son, two grandchildren.  Quit alcohol and tobacco 12  years ago.  Drinks 2 cups of coffee daily.   MEDICATIONS:  1.  Prevacid 30 mg p.o. daily.  2.  Effexor 150 mg p.o. daily.  3.  Hydrochlorothiazide 25 mg p.o. daily.  4.  Iron replacement daily.  5.  Tylenol Arthritis 2 tablets q.8 hours.   ALLERGIES:  SULFA causes rash.   FAMILY HISTORY:  Not significant for early coronary disease.   REVIEW OF SYSTEMS:  She denies any problems with hearing or visual changes.  No difficulty with lightheadedness or balance.  She has not been falling  recently.  She occasionally has difficulty swallowing solid foods, not with  liquids.  She has not had cough or hemoptysis.  She was a little short of  breath during the discomfort but has not had that chronically.  She denies  any bowel pain, hematochezia, melena, vomiting, or diarrhea. She has not had  any dysuria or hematuria.  She denies any major weightbearing pain.  Her  arthritic problems are confined primarily to the hands.  She has never had a  stroke or any neurologic difficulty.  No muscle pain or weakness.   PHYSICAL EXAMINATION:  VITAL SIGNS:  Blood pressure 136/74, pulse 94 and  regular, respirations 20, temperature 98, O2 saturation 98% on room air.  GENERAL:  This is well but  elderly-appearing, 77 year old woman in no  distress, alert and oriented, cooperative.  HEENT:  Unremarkable.  Head is atraumatic and normocephalic.  Pupils equal,  round, and reactive to light and accommodation.  Extraocular movements are  intact. Sclerae are anicteric.  Oral mucosa is pink and moist.  Teeth and  gums are in good repair.  Tongue is not coated.  NECK:  Supple without thyromegaly or masses.  Carotid upstrokes are normal.  There is no bruit.  There is no jugular venous distention.  CHEST:  Clear with adequate excursion.  There is normal vesicular breath  sounds throughout.  The precordium is quiet.  Normal S1 and S2 is heard.  No  S3, S4, murmur, click, or rub noted.  ABDOMEN:  Soft and nontender without hepatosplenomegaly or midline pulsatile  mass. Bowel sounds are present in all quadrants.  GU: External genitalia is atrophic.  RECTAL:  Not performed.  EXTREMITIES:  Show full range of motion, no edema, intact distal pulses.  NEUROLOGIC:  Cranial nerves II-XII intact.   Motor and sensory grossly  intact.  Gait not tested.  SKIN: Warm, dry, and clear.   ACCESSORY CLINICAL DATA:  Admission hemogram shows a white blood cell count  of 10,700, hemoglobin 12.5 g, hematocrit 37%, and platelets 362,000.  Serum  electrolytes: BUN, creatinine, and glucose all within normal limits.  Point-  of-care enzymes including CK-MB, troponin, and myoglobin are negative x3.   Electrocardiogram shows right bundle branch block.  No prior tracing for  comparison, no acute changes.   Chest x-ray: No active cardiopulmonary disease.   IMPRESSION:  1.  This 77 year old woman presents with prolonged episode of angina at rest      (unstable).  2.  Hypertension, well controlled.  3.  History of anemia, none currently.  4.  History of depression, treated.  5.  Degenerative joint disease, primarily of the hands with [place] nodes      bilaterally.   PLAN:  1.  The patient is admitted for continued rule out of myocardial infarction.  2.  Repeat CK-MB and troponin x2 over the next 24 hours.  3.  Subcutaneous Lovenox, aspirin, and beta blocker will be initiated;      p.r.n. nitrates currently.  4.  Will plan for adenosine or exercise Cardiolite in the morning unless      recurrent pain occurs or she develops ECG changes or positive enzymes.      As I have informed the patient, the study will have to be completely      normal; otherwise, she will require cardiac catheterization.      Francisca December, M.D.  Electronically Signed     JHE/MEDQ  D:  06/14/2005  T:  06/15/2005  Job:  [id number]   cc:   Delaney Meigs, M.D.  Fax: [phone]

## 2011-06-11 LAB — I-STAT 8, (EC8 V) (CONVERTED LAB)
Acid-Base Excess: 6 — ABNORMAL HIGH
BUN: 12
Bicarbonate: 26 — ABNORMAL HIGH
HCT: 39
Operator id: 123881
pCO2, Ven: 23.8 — ABNORMAL LOW

## 2011-07-02 LAB — DIFFERENTIAL
Basophils Absolute: 0
Lymphocytes Relative: 15
Lymphs Abs: 1.2
Monocytes Absolute: 0.7
Monocytes Relative: 9
Neutro Abs: 6.1

## 2011-07-02 LAB — BASIC METABOLIC PANEL
CO2: 26
Chloride: 100
Creatinine, Ser: 0.95
GFR calc Af Amer: >60

## 2011-07-02 LAB — CBC
MCHC: 32.8
MCV: 91.4
RBC: 4.35

## 2011-07-02 LAB — POCT CARDIAC MARKERS
CKMB, poc: 1.3
Myoglobin, poc: 107
Myoglobin, poc: 87.9
Operator id: 288831

## 2011-09-18 ENCOUNTER — Encounter: Payer: Self-pay | Admitting: *Deleted

## 2011-09-18 ENCOUNTER — Emergency Department (HOSPITAL_COMMUNITY): Payer: Medicare Other

## 2011-09-18 ENCOUNTER — Inpatient Hospital Stay (HOSPITAL_COMMUNITY)
Admission: EM | Admit: 2011-09-18 | Discharge: 2011-09-21 | DRG: 195 | Disposition: A | Discharge status: Home / Self Care | Payer: Medicare Other | Attending: Internal Medicine | Admitting: Internal Medicine

## 2011-09-18 DIAGNOSIS — I10 Essential (primary) hypertension: Secondary | ICD-10-CM | POA: Diagnosis present

## 2011-09-18 DIAGNOSIS — R062 Wheezing: Secondary | ICD-10-CM

## 2011-09-18 DIAGNOSIS — F411 Generalized anxiety disorder: Secondary | ICD-10-CM | POA: Diagnosis present

## 2011-09-18 DIAGNOSIS — Z66 Do not resuscitate: Secondary | ICD-10-CM | POA: Diagnosis not present

## 2011-09-18 DIAGNOSIS — F32A Depression, unspecified: Secondary | ICD-10-CM | POA: Diagnosis present

## 2011-09-18 DIAGNOSIS — M19049 Primary osteoarthritis, unspecified hand: Secondary | ICD-10-CM | POA: Diagnosis present

## 2011-09-18 DIAGNOSIS — F329 Major depressive disorder, single episode, unspecified: Secondary | ICD-10-CM | POA: Diagnosis present

## 2011-09-18 DIAGNOSIS — F3289 Other specified depressive episodes: Secondary | ICD-10-CM | POA: Diagnosis present

## 2011-09-18 DIAGNOSIS — R509 Fever, unspecified: Secondary | ICD-10-CM

## 2011-09-18 DIAGNOSIS — J101 Influenza due to other identified influenza virus with other respiratory manifestations: Secondary | ICD-10-CM | POA: Diagnosis present

## 2011-09-18 DIAGNOSIS — J09X2 Influenza due to identified novel influenza A virus with other respiratory manifestations: Principal | ICD-10-CM | POA: Diagnosis present

## 2011-09-18 DIAGNOSIS — Z87891 Personal history of nicotine dependence: Secondary | ICD-10-CM

## 2011-09-18 HISTORY — DX: Major depressive disorder, single episode, unspecified: F32.9

## 2011-09-18 HISTORY — DX: Essential (primary) hypertension: I10

## 2011-09-18 HISTORY — DX: Diaphragmatic hernia without obstruction or gangrene: K44.9

## 2011-09-18 HISTORY — DX: Iron deficiency anemia, unspecified: D50.9

## 2011-09-18 HISTORY — DX: Unspecified osteoarthritis, unspecified site: M19.90

## 2011-09-18 HISTORY — DX: Depression, unspecified: F32.A

## 2011-09-18 LAB — CBC
MCH: 30.2 pg (ref 26.0–34.0)
MCV: 91.5 fL (ref 78.0–100.0)
Platelets: 290 10*3/uL (ref 150–400)
RBC: 4.24 MIL/uL (ref 3.87–5.11)

## 2011-09-18 LAB — DIFFERENTIAL
Eosinophils Absolute: 0.1 10*3/uL (ref 0.0–0.7)
Eosinophils Relative: 1 % (ref 0–5)
Lymphs Abs: 0.6 10*3/uL — ABNORMAL LOW (ref 0.7–4.0)

## 2011-09-18 LAB — BASIC METABOLIC PANEL
BUN: 12 mg/dL (ref 6–23)
Calcium: 8.7 mg/dL (ref 8.4–10.5)
Creatinine, Ser: 0.77 mg/dL (ref 0.50–1.10)
GFR calc non Af Amer: 77 mL/min — ABNORMAL LOW (ref >90)
Glucose, Bld: 102 mg/dL — ABNORMAL HIGH (ref 70–99)
Sodium: 133 mEq/L — ABNORMAL LOW (ref 135–145)

## 2011-09-18 MED ORDER — ACETAMINOPHEN 325 MG PO TABS
ORAL_TABLET | ORAL | Status: AC
  Filled 2011-09-18: qty 2

## 2011-09-18 MED ORDER — SODIUM CHLORIDE 0.9 % IV SOLN: INTRAVENOUS | Status: DC

## 2011-09-18 MED ORDER — OSELTAMIVIR PHOSPHATE 75 MG PO CAPS
75.0000 mg | ORAL_CAPSULE | Freq: Every day | ORAL | Status: DC
  Administered 2011-09-19: 75 mg via ORAL
  Filled 2011-09-18 (×2): qty 1

## 2011-09-18 MED ORDER — GUAIFENESIN ER 600 MG PO TB12
600.0000 mg | ORAL_TABLET | Freq: Two times a day (BID) | ORAL | Status: DC
  Administered 2011-09-19 – 2011-09-21 (×6): 600 mg via ORAL
  Filled 2011-09-18 (×7): qty 1

## 2011-09-18 MED ORDER — ONDANSETRON HCL 4 MG/2ML IJ SOLN: 4.0000 mg | Freq: Four times a day (QID) | INTRAMUSCULAR | Status: DC | PRN

## 2011-09-18 MED ORDER — FENTANYL CITRATE 0.05 MG/ML IJ SOLN
100.0000 ug | Freq: Once | INTRAMUSCULAR | Status: AC
  Administered 2011-09-18: 100 ug via INTRAVENOUS
  Filled 2011-09-18: qty 2

## 2011-09-18 MED ORDER — ALBUTEROL SULFATE (5 MG/ML) 0.5% IN NEBU
5.0000 mg | INHALATION_SOLUTION | Freq: Once | RESPIRATORY_TRACT | Status: AC
  Administered 2011-09-18: 5 mg via RESPIRATORY_TRACT
  Filled 2011-09-18: qty 1

## 2011-09-18 MED ORDER — AMLODIPINE BESYLATE 5 MG PO TABS
5.0000 mg | ORAL_TABLET | Freq: Every day | ORAL | Status: DC
  Administered 2011-09-19 – 2011-09-21 (×3): 5 mg via ORAL
  Filled 2011-09-18 (×3): qty 1

## 2011-09-18 MED ORDER — SENNA 8.6 MG PO TABS
1.0000 | ORAL_TABLET | Freq: Two times a day (BID) | ORAL | Status: DC
  Administered 2011-09-19 – 2011-09-20 (×4): 8.6 mg via ORAL
  Filled 2011-09-18 (×4): qty 1

## 2011-09-18 MED ORDER — ALBUTEROL SULFATE (5 MG/ML) 0.5% IN NEBU
2.5000 mg | INHALATION_SOLUTION | Freq: Four times a day (QID) | RESPIRATORY_TRACT | Status: DC
  Administered 2011-09-19 – 2011-09-21 (×9): 2.5 mg via RESPIRATORY_TRACT
  Filled 2011-09-18 (×8): qty 0.5

## 2011-09-18 MED ORDER — ONDANSETRON HCL 4 MG PO TABS: 4.0000 mg | ORAL_TABLET | Freq: Four times a day (QID) | ORAL | Status: DC | PRN

## 2011-09-18 MED ORDER — SODIUM CHLORIDE 0.9 % IV SOLN
INTRAVENOUS | Status: DC
  Administered 2011-09-19: via INTRAVENOUS

## 2011-09-18 MED ORDER — DOCUSATE SODIUM 100 MG PO CAPS
100.0000 mg | ORAL_CAPSULE | Freq: Two times a day (BID) | ORAL | Status: DC
  Administered 2011-09-19 – 2011-09-21 (×6): 100 mg via ORAL
  Filled 2011-09-18 (×7): qty 1

## 2011-09-18 MED ORDER — ALBUTEROL SULFATE (5 MG/ML) 0.5% IN NEBU: 2.5000 mg | INHALATION_SOLUTION | Freq: Four times a day (QID) | RESPIRATORY_TRACT | Status: DC | PRN

## 2011-09-18 MED ORDER — ACETAMINOPHEN 650 MG RE SUPP: 650.0000 mg | Freq: Four times a day (QID) | RECTAL | Status: DC | PRN

## 2011-09-18 MED ORDER — ACETAMINOPHEN 325 MG PO TABS
650.0000 mg | ORAL_TABLET | Freq: Four times a day (QID) | ORAL | Status: DC | PRN
  Administered 2011-09-19 – 2011-09-20 (×2): 650 mg via ORAL
  Filled 2011-09-18 (×2): qty 2

## 2011-09-18 MED ORDER — ALBUTEROL SULFATE (5 MG/ML) 0.5% IN NEBU
2.5000 mg | INHALATION_SOLUTION | RESPIRATORY_TRACT | Status: DC | PRN
  Filled 2011-09-18: qty 0.5

## 2011-09-18 MED ORDER — HEPARIN SODIUM (PORCINE) 5000 UNIT/ML IJ SOLN
5000.0000 [IU] | Freq: Three times a day (TID) | INTRAMUSCULAR | Status: DC
  Administered 2011-09-19 – 2011-09-20 (×7): 5000 [IU] via SUBCUTANEOUS
  Filled 2011-09-18 (×11): qty 1

## 2011-09-18 MED ORDER — SODIUM CHLORIDE 0.9 % IV BOLUS (SEPSIS)
500.0000 mL | Freq: Once | INTRAVENOUS | Status: AC
  Administered 2011-09-18: 500 mL via INTRAVENOUS

## 2011-09-18 MED ORDER — IPRATROPIUM BROMIDE 0.02 % IN SOLN
0.5000 mg | Freq: Four times a day (QID) | RESPIRATORY_TRACT | Status: DC
  Administered 2011-09-19 – 2011-09-21 (×9): 0.5 mg via RESPIRATORY_TRACT
  Filled 2011-09-18 (×10): qty 2.5

## 2011-09-18 MED ORDER — ACETAMINOPHEN 325 MG PO TABS
650.0000 mg | ORAL_TABLET | Freq: Once | ORAL | Status: AC
  Administered 2011-09-18: 650 mg via ORAL

## 2011-09-18 MED ORDER — VENLAFAXINE HCL ER 75 MG PO CP24
75.0000 mg | ORAL_CAPSULE | Freq: Two times a day (BID) | ORAL | Status: DC
  Administered 2011-09-19: 150 mg via ORAL
  Administered 2011-09-19 – 2011-09-21 (×5): 75 mg via ORAL
  Filled 2011-09-18 (×7): qty 2

## 2011-09-18 NOTE — ED Provider Notes (Signed)
History     CSN: [id number]  Arrival date & time 09/18/11  1649   First MD Initiated Contact with Patient 09/18/11 1948      Chief Complaint  Patient presents with  . Cough  . Wheezing  . Altered Mental Status    (Consider location/radiation/quality/duration/timing/severity/associated sxs/prior treatment) HPI Pts sisters report pt has seemed confused since this afternoon approx 3pm. Cannot give exact LSN. Reports pts son called them and told them she was more confused and slurring her words. No slurred speech or facial droop noted. Pt slow to name president and answer some questions. Unable to recall present yr. Also reports cough for a few days and noted fever 102.6 today.   Past Medical History  Diagnosis Date  . Hypertension   . Arthritis     History reviewed. No pertinent past surgical history.  No family history on file.  History  Substance Use Topics  . Smoking status: Former Games developer  . Smokeless tobacco: Not on file  . Alcohol Use: No    OB History    Grav Para Term Preterm Abortions TAB SAB Ect Mult Living                  Review of Systems  All other systems reviewed and are negative.    Allergies  Sulfa antibiotics  Home Medications   Current Outpatient Rx  Name Route Sig Dispense Refill  . ACETAMINOPHEN 500 MG PO TABS Oral Take 1,000 mg by mouth 2 (two) times daily.      Marland Kitchen AMLODIPINE BESYLATE 5 MG PO TABS Oral Take 5 mg by mouth daily.      . B COMPLEX PO TABS Oral Take 1 tablet by mouth daily.      Marland Kitchen SE-TAN PLUS 162-115.2-1 MG PO CAPS Oral Take 1 capsule by mouth daily.      Marland Kitchen LORAZEPAM 0.5 MG PO TABS Oral Take 0.5 mg by mouth 3 (three) times daily.      . VENLAFAXINE HCL 75 MG PO CP24 Oral Take 75-150 mg by mouth 2 (two) times daily. Takes 2 capsule every morning for 150mg  dosage and takes 1 capsule at night for 75mg  dosage       BP 136/76  Pulse 104  Temp(Src) 101.2 F (38.4 C) (Oral)  Resp 22  SpO2 99%  Physical Exam  Nursing note  and vitals reviewed. Constitutional: She is oriented to person, place, and time. She appears well-developed and well-nourished. No distress.  HENT:  Head: Normocephalic and atraumatic.  Eyes: Pupils are equal, round, and reactive to light.  Neck: Normal range of motion.  Cardiovascular: Normal rate and intact distal pulses.   Pulmonary/Chest: Tachypnea noted. She has wheezes.  Abdominal: Normal appearance. She exhibits no distension.  Musculoskeletal: Normal range of motion.  Neurological: She is alert and oriented to person, place, and time. No cranial nerve deficit.  Skin: Skin is warm and dry. No rash noted.  Psychiatric: She has a normal mood and affect. Her behavior is normal.    ED Course  Procedures (including critical care time)  Labs Reviewed  DIFFERENTIAL - Abnormal; Notable for the following:    Neutrophils Relative 78 (*)    Lymphocytes Relative 6 (*)    Lymphs Abs 0.6 (*)    Monocytes Relative 15 (*)    Monocytes Absolute 1.4 (*)    All other components within normal limits  BASIC METABOLIC PANEL - Abnormal; Notable for the following:    Sodium 133 (*)  Glucose, Bld 102 (*)    GFR calc non Af Amer 77 (*)    GFR calc Af Amer 90 (*)    All other components within normal limits  CBC  URINALYSIS, ROUTINE W REFLEX MICROSCOPIC  URINE CULTURE   Dg Chest 2 View  09/18/2011  *RADIOLOGY REPORT*  Clinical Data: Fever and cough  CHEST - 2 VIEW  Comparison: 05/07/2011  Findings: Heart size appears mildly enlarged.  No pleural effusion or pulmonary edema.  Moderate size hiatal hernia appears similar to previous exam.  There is mild multilevel spondylosis within the thoracic spine.  Moderate degenerative changes involve the right the glenohumeral joint.  IMPRESSION:  1.  No acute cardiopulmonary abnormalities. 2.  Hiatal hernia.  Original Report Authenticated By: Rosealee Albee, M.D.     1. Febrile illness   2. Wheezing       MDM         Nelia Shi,  MD 09/18/11 [phone]

## 2011-09-18 NOTE — H&P (Signed)
PCP:   Gretel Acre, MD  Confirmed with pt. With [facility].   Chief Complaint:  Fevers, achiness, cough, minimal confusion  HPI: 82yoF with h/o HTN, otherwise fairly healthy presents with influenza-like illness.   Pt and her two sisters report that pt has been coughing for the past couple days, productive of  clear white sputum. Cough has been severe it has caused headaches and nausea. This afternoon  she developed fever to 102.6 at home. She has minimal achiness in her body yesterday and in her  R arm today. Her sisters called PCP's office because earlier today she was reportedly with  decreased mental clarity, saying things that didn't make sense. They were referred to ED.   In the ED, pt was febrile to 101.2, tachycardic to 104-109, BP stable. O2 high 80's - low 90's  on RA but improves with [place]. Chem panel with Na 133, renal 12/0.77. WBC 9.2 with 78% neutros, 6%  lymphs, 15% monos. Rest of CBC normal. CXR without acute cardiopulmonary abnmls. Pt was given  Tylenol 650, albuterol, fentanyl, 500 cc NS. Admission was requested given her advanced age.  ROS as above, otherwise positive for postnasal drip, but no sore throat. No chest pain.  Possible minimal RLE ankle swelling on Christmas day. Minimal diarrhea. Otherwise negative all  other systems.   Past Medical History  Diagnosis Date  . Hypertension   . Arthritis   . Hiatal hernia     GERD  . Iron deficiency anemia   . DJD (degenerative joint disease)     Hands  . Depression     Past Surgical History  Procedure Date  . Orif radial fracture 09/2007   Medications:  HOME MEDS: Reconciled with a basket of meds brought by the sisters. Pt is taking them all.  Prior to Admission medications   Medication Sig Start Date End Date Taking? Authorizing Provider  acetaminophen (TYLENOL) 500 MG tablet Take 1,000 mg by mouth 2 (two) times daily.     Yes Historical Provider, MD  amLODipine (NORVASC) 5 MG tablet Take 5 mg by mouth daily.      Yes Historical Provider, MD  b complex vitamins tablet Take 1 tablet by mouth daily.     Yes Historical Provider, MD  FeFum-FePo-FA-B Cmp-C-Zn-Mn-Cu (SE-TAN PLUS) 162-115.2-1 MG CAPS Take 1 capsule by mouth daily.     Yes Historical Provider, MD  LORazepam (ATIVAN) 0.5 MG tablet Take 0.5 mg by mouth 3 (three) times daily as needed.    Yes Historical Provider, MD  venlafaxine (EFFEXOR-XR) 75 MG 24 hr capsule Take 75-150 mg by mouth 2 (two) times daily. Takes 2 capsule every morning for 150mg  dosage and takes 1 capsule at night for 75mg  dosage    Yes Historical Provider, MD    Allergies:  Allergies  Allergen Reactions  . Sulfa Antibiotics Itching    Social History:   reports that she has quit smoking. She does not have any smokeless tobacco history on file. She reports that she does not drink alcohol or use illicit drugs. 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  Lives independently and able to get around without a cane or walker, still quite healthy and active. Has two sisters who are involved. One son, two grandchildren. Her son currently has a brain tumor, which is causing the patient stress. [name], sister, [phone]. She quit smoking and drinking about 20 yrs ago.   Family History:   Father with DM and stroke, mother with AFib  Physical Exam: Filed Vitals:   09/18/11 1745 09/18/11 2043 09/18/11 2112  BP: 160/78 136/76   Pulse: 109 107 104  Temp: 98.8 F (37.1 C) 101.2 F (38.4 C)   TempSrc: Oral Oral   Resp: 18  22  SpO2: 0% 98% 99%   Blood pressure 136/76, pulse 104, temperature 101.2 F (38.4 C), temperature source Oral, resp. rate 22, SpO2 99.00%. Gen: Elderly F in no distress, now appears alert, following conversation, answering quite  clearly and appropriately, no cardiopulmonary distress, appears well.  HEENT: PERRL, EOMI, sceral are clear and normal. Mouth is moist, no gross lesions, teeth in  place.  Neck: Supple, no thyromegaly, normal  exam Lungs: Obvious gross expiratory wheezing and coarse breath sounds bilaterally, with good air  movement Heart: RRR, no m/g, normal exam Abd: Soft, non obese, non distended, non tender, benign exam Extrem: Thin, with decreased age-related bulk, but overall normal appearing, warm and  perfusing, no BLE edema, no R ankle edema noted.  Neuro: Alert, attentive, conversant, CN 2-12 grossly intact, moving BUE's normally, grossly  non-focal.   Labs & Imaging Results for orders placed during the hospital encounter of 09/18/11 (from the past 48 hour(s))  CBC     Status: Normal   Collection Time   09/18/11  8:25 PM      Component Value Range Comment   WBC 9.2  4.0 - 10.5 (K/uL)    RBC 4.24  3.87 - 5.11 (MIL/uL)    Hemoglobin 12.8  12.0 - 15.0 (g/dL)    HCT 41.3  24.4 - 01.0 (%)    MCV 91.5  78.0 - 100.0 (fL)    MCH 30.2  26.0 - 34.0 (pg)    MCHC 33.0  30.0 - 36.0 (g/dL)    RDW 27.2  53.6 - 64.4 (%)    Platelets 290  150 - 400 (K/uL)   DIFFERENTIAL     Status: Abnormal   Collection Time   09/18/11  8:25 PM      Component Value Range Comment   Neutrophils Relative 78 (*) 43 - 77 (%)    Neutro Abs 7.1  1.7 - 7.7 (K/uL)    Lymphocytes Relative 6 (*) 12 - 46 (%)    Lymphs Abs 0.6 (*) 0.7 - 4.0 (K/uL)    Monocytes Relative 15 (*) 3 - 12 (%)    Monocytes Absolute 1.4 (*) 0.1 - 1.0 (K/uL)    Eosinophils Relative 1  0 - 5 (%)    Eosinophils Absolute 0.1  0.0 - 0.7 (K/uL)    Basophils Relative 0  0 - 1 (%)    Basophils Absolute 0.0  0.0 - 0.1 (K/uL)   BASIC METABOLIC PANEL     Status: Abnormal   Collection Time   09/18/11  8:25 PM      Component Value Range Comment   Sodium 133 (*) 135 - 145 (mEq/L)    Potassium 3.8  3.5 - 5.1 (mEq/L)    Chloride 97  96 - 112 (mEq/L)    CO2 26  19 - 32 (mEq/L)    Glucose, Bld 102 (*) 70 - 99 (mg/dL)    BUN 12  6 - 23 (mg/dL)    Creatinine, Ser 0.34  0.50 - 1.10 (mg/dL)    Calcium 8.7  8.4 - 10.5 (mg/dL)    GFR calc non Af Amer 77 (*) >90 (mL/min)     GFR calc Af Amer 90 (*) >90 (mL/min)    Dg Chest 2 View  09/18/2011  *RADIOLOGY REPORT*  Clinical Data: Fever and cough  CHEST - 2 VIEW  Comparison: 05/07/2011  Findings: Heart size appears mildly enlarged.  No pleural effusion or pulmonary edema.  Moderate size hiatal hernia appears similar to previous exam.  There is mild multilevel spondylosis within the thoracic spine.  Moderate degenerative changes involve the right the glenohumeral joint.  IMPRESSION:  1.  No acute cardiopulmonary abnormalities. 2.  Hiatal hernia.  Original Report Authenticated By: Rosealee Albee, M.D.    Impression Present on Admission:  .Influenza-like illness .Hypertension .Depression  82yoF with h/o HTN, otherwise fairly healthy presents with influenza-like illness.   1. Influenza-like illness: pt with fevers, minimal achiness, confusion now not seen, and  rhochorous lungs with negative CXR.   - Influenza swab, Tamiflu prophylaxis dosing (increase to BID if swab positive), nebulizers,  mucinex. IVF's at maintenance, Tylenol and BCx's for fevers.   2. H/o HTN: Stable to minimally hypertense. Continue home Amlodipine.  3. H/o depression/anxiety: continue home effexor, holding ativan   Regular bed, [facility] team 1 SubQ heparin DNR/DNI, discussed with pt and sisters.   Other plans as per orders.  Genowefa Morga 09/18/2011, 11:22 PM

## 2011-09-18 NOTE — ED Notes (Signed)
Pts sisters report pt has seemed confused since this afternoon approx 3pm. Cannot give exact LSN. Reports pts son called them and told them she was more confused and slurring her words. No slurred speech or facial droop noted. Pt slow to name president and answer some questions. Unable to recall present yr. Also reports cough for a few days and noted fever 102.6 today.

## 2011-09-18 NOTE — ED Notes (Signed)
Patient unable to void at this time. Patient has bedside commode at bedside. Patient and family instructed to press call button when need arises. Patient and family aware of need for urine testing

## 2011-09-18 NOTE — ED Notes (Signed)
Pt reports feeling much better after breathing treatment

## 2011-09-18 NOTE — ED Notes (Signed)
Transported to xray 

## 2011-09-19 LAB — BASIC METABOLIC PANEL
BUN: 10 mg/dL (ref 6–23)
Chloride: 102 mEq/L (ref 96–112)
GFR calc Af Amer: 90 mL/min — ABNORMAL LOW (ref >90)
GFR calc non Af Amer: 77 mL/min — ABNORMAL LOW (ref >90)
Potassium: 3.6 mEq/L (ref 3.5–5.1)

## 2011-09-19 LAB — URINALYSIS, ROUTINE W REFLEX MICROSCOPIC
Bilirubin Urine: NEGATIVE
Ketones, ur: NEGATIVE mg/dL
Protein, ur: NEGATIVE mg/dL
Urobilinogen, UA: 0.2 mg/dL (ref 0.0–1.0)

## 2011-09-19 LAB — INFLUENZA PANEL BY PCR (TYPE A & B)
H1N1 flu by pcr: NOT DETECTED
Influenza A By PCR: POSITIVE — AB

## 2011-09-19 LAB — CBC
HCT: 34 % — ABNORMAL LOW (ref 36.0–46.0)
MCHC: 32.9 g/dL (ref 30.0–36.0)
Platelets: 232 10*3/uL (ref 150–400)
RDW: 13.2 % (ref 11.5–15.5)
WBC: 6.9 10*3/uL (ref 4.0–10.5)

## 2011-09-19 LAB — URINE MICROSCOPIC-ADD ON

## 2011-09-19 MED ORDER — OSELTAMIVIR PHOSPHATE 75 MG PO CAPS
75.0000 mg | ORAL_CAPSULE | Freq: Two times a day (BID) | ORAL | Status: DC
  Administered 2011-09-19 – 2011-09-21 (×4): 75 mg via ORAL
  Filled 2011-09-19 (×5): qty 1

## 2011-09-19 NOTE — Plan of Care (Signed)
Problem: Phase I Progression Outcomes Goal: OOB as tolerated unless otherwise ordered Outcome: Completed/Met Date Met:  09/19/11 Pt ambulates independently

## 2011-09-19 NOTE — Progress Notes (Signed)
Subjective: Pt states that she has had some SOB today.  Feels better than yesterday.  No acute issues overnight.  Objective: Filed Vitals:   09/19/11 0737 09/19/11 0913 09/19/11 1410 09/19/11 1433  BP:   118/66   Pulse:   93   Temp:  100 F (37.8 C) 99.1 F (37.3 C)   TempSrc:  Oral Oral   Resp:   18   Height:      Weight:      SpO2: 99%  98% 96%   Weight change:   Intake/Output Summary (Last 24 hours) at 09/19/11 1925 Last data filed at 09/19/11 1008  Gross per 24 hour  Intake 1155.42 ml  Output    400 ml  Net 755.42 ml    General: Alert, awake, oriented x3, in no acute distress.  HEENT: No bruits, no goiter.  Heart: Regular rate and rhythm, without murmurs, rubs, gallops.  Lungs: Good air movement, mild exp wheezes BL Abdomen: Soft, nontender, nondistended, positive bowel sounds.  Neuro: Grossly intact, nonfocal.   Lab Results:  Basename 09/19/11 0455 09/18/11 2025  NA 134* 133*  K 3.6 3.8  CL 102 97  CO2 24 26  GLUCOSE 107* 102*  BUN 10 12  CREATININE 0.77 0.77  CALCIUM 8.1* 8.7  MG -- --  PHOS -- --   No results found for this basename: AST:2,ALT:2,ALKPHOS:2,BILITOT:2,PROT:2,ALBUMIN:2 in the last 72 hours No results found for this basename: LIPASE:2,AMYLASE:2 in the last 72 hours  Basename 09/19/11 0455 09/18/11 2025  WBC 6.9 9.2  NEUTROABS -- 7.1  HGB 11.2* 12.8  HCT 34.0* 38.8  MCV 91.4 91.5  PLT 232 290   No results found for this basename: CKTOTAL:3,CKMB:3,CKMBINDEX:3,TROPONINI:3 in the last 72 hours No components found with this basename: POCBNP:3 No results found for this basename: DDIMER:2 in the last 72 hours No results found for this basename: HGBA1C:2 in the last 72 hours No results found for this basename: CHOL:2,HDL:2,LDLCALC:2,TRIG:2,CHOLHDL:2,LDLDIRECT:2 in the last 72 hours No results found for this basename: TSH,T4TOTAL,FREET3,T3FREE,THYROIDAB in the last 72 hours No results found for this basename:  VITAMINB12:2,FOLATE:2,FERRITIN:2,TIBC:2,IRON:2,RETICCTPCT:2 in the last 72 hours  Micro Results: No results found for this or any previous visit (from the past 240 hour(s)).  Studies/Results: Dg Chest 2 View  09/18/2011  *RADIOLOGY REPORT*  Clinical Data: Fever and cough  CHEST - 2 VIEW  Comparison: 05/07/2011  Findings: Heart size appears mildly enlarged.  No pleural effusion or pulmonary edema.  Moderate size hiatal hernia appears similar to previous exam.  There is mild multilevel spondylosis within the thoracic spine.  Moderate degenerative changes involve the right the glenohumeral joint.  IMPRESSION:  1.  No acute cardiopulmonary abnormalities. 2.  Hiatal hernia.  Original Report Authenticated By: Rosealee Albee, M.D.    Medications: I have reviewed the patient's current medications.   Patient Active Hospital Problem List: Influenza-like illness (09/18/2011)  Influenza A positive.  Have increase tamiflu to BID.  Will wean off of supplemental O2 today and if patient improved suspect D/C tomorrow on tamiflu.  Po intake improved and at baseline will d/c ivf's     Hypertension ()   Well controlled currently  Depression () Stable      LOS: 1 day   Penny Pia M.D.  [facility] 09/19/2011, 7:25 PM

## 2011-09-19 NOTE — Progress Notes (Signed)
Physical Therapy Evaluation Patient Details Name: Tyjae Shvartsman MRN: [id number] DOB: [birth year] Today's Date: 09/19/2011 8:43-9:02, EV2  Problem List:  Patient Active Problem List  Diagnoses  . Hypertension  . Depression  . Influenza-like illness    Past Medical History:  Past Medical History  Diagnosis Date  . Hypertension   . Arthritis   . Hiatal hernia     GERD  . Iron deficiency anemia   . DJD (degenerative joint disease)     Hands  . Depression    Past Surgical History:  Past Surgical History  Procedure Date  . Orif radial fracture 09/2007    PT Assessment/Plan/Recommendation PT Assessment Clinical Impression Statement: 77 y/o WF admitted with fever, increased cough, and increased confusion.  Pt limited in mobility at time of eval secondary to just feeling "blah" with little energy.  Pt denies pain, and was agreeable to walk around bed to get into recliner.  Anticipate that pt should be able to go home with HHPT and family support.  Discussed with pt of having family stay with her when she gets home for a few days until she gets her energy back.  Pt verbalized understanding.  Will follow pt acutely to prevent decline in functional mobility and address pt's unsteadiness in gait. PT Recommendation/Assessment: Patient will need skilled PT in the acute care venue PT Problem List: Decreased mobility;Decreased balance Barriers to Discharge Comments: Pt lives alone, but reports her sisters can come and help her. PT Therapy Diagnosis : Difficulty walking;Generalized weakness PT Plan PT Frequency: Min 3X/week PT Treatment/Interventions: Gait training;Functional mobility training;Therapeutic activities;Therapeutic exercise;Balance training;Patient/family education PT Recommendation Follow Up Recommendations: 24 hour supervision/assistance;Home health PT Equipment Recommended: 3 in 1 bedside comode PT Goals  Acute Rehab PT Goals PT Goal Formulation: With patient Time For  Goal Achievement: 2 weeks Pt will go Supine/Side to Sit: with modified independence PT Goal: Supine/Side to Sit - Progress: Not met Pt will go Sit to Supine/Side: with modified independence PT Goal: Sit to Supine/Side - Progress: Not met Pt will go Sit to Stand: with modified independence PT Goal: Sit to Stand - Progress: Not met Pt will Ambulate: 51 - 150 feet;with modified independence PT Goal: Ambulate - Progress: Not met Pt will Perform Home Exercise Program: with supervision, verbal cues required/provided PT Goal: Perform Home Exercise Program - Progress: Not met  PT Evaluation Precautions/Restrictions  Precautions Precautions: Fall Restrictions Weight Bearing Restrictions: No Prior Functioning  Home Living Lives With: Alone Receives Help From: Family Type of Home: House Home Layout: One level Home Access: Level entry Bathroom Shower/Tub: Walk-in shower Home Adaptive Equipment: Straight cane Prior Function Level of Independence: Independent with homemaking with ambulation Driving: Yes Cognition Cognition Arousal/Alertness: Awake/alert Overall Cognitive Status: Appears within functional limits for tasks assessed Sensation/Coordination Sensation Light Touch: Appears Intact Coordination Gross Motor Movements are Fluid and Coordinated: Yes Extremity Assessment RLE Assessment RLE Assessment: Within Functional Limits (WFL strength but decreased muscular endurance) LLE Assessment LLE Assessment: Within Functional Limits (WFL strenght but decreased muscular endurance) Mobility (including Balance) Bed Mobility Bed Mobility: Yes Supine to Sit: 6: Modified independent (Device/Increase time);HOB elevated (Comment degrees) (30 degrees) Transfers Transfers: Yes Sit to Stand: 5: Supervision Sit to Stand Details (indicate cue type and reason): good use of arms Stand to Sit: 5: Supervision Ambulation/Gait Ambulation/Gait: Yes Ambulation/Gait Assistance: 4: Min  assist Ambulation/Gait Assistance Details (indicate cue type and reason): min/guard Ambulation Distance (Feet): 12 Feet Assistive device: None Gait Pattern: Within Functional Limits (pt reports she feels  a little off-balance)  Balance Balance Assessed: Yes Dynamic Standing Balance Dynamic Standing - Level of Assistance: 4: Min assist (MIN/GUARD, pt reports feeling a little unsteady)   End of Session PT - End of Session Equipment Utilized During Treatment: Gait belt Activity Tolerance: Patient limited by fatigue Patient left: in chair;with call bell in reach Nurse Communication: Mobility status for transfers;Mobility status for ambulation General Behavior During Session: [facility] for tasks performed Cognition: [facility] for tasks performed  [facility] LUBECK 09/19/2011, 9:44 AM

## 2011-09-20 LAB — URINE CULTURE

## 2011-09-20 LAB — CBC
HCT: 35.8 % — ABNORMAL LOW (ref 36.0–46.0)
MCH: 30.1 pg (ref 26.0–34.0)
MCV: 91.3 fL (ref 78.0–100.0)
RBC: 3.92 MIL/uL (ref 3.87–5.11)
RDW: 13.4 % (ref 11.5–15.5)
WBC: 4.8 10*3/uL (ref 4.0–10.5)

## 2011-09-20 MED ORDER — ALBUTEROL SULFATE (5 MG/ML) 0.5% IN NEBU
INHALATION_SOLUTION | RESPIRATORY_TRACT | Status: AC
  Administered 2011-09-20: 2.5 mg via RESPIRATORY_TRACT
  Filled 2011-09-20: qty 0.5

## 2011-09-20 NOTE — Progress Notes (Signed)
Subjective: An 77 year old patient who was admitted 2 days ago for a flulike illness. Evaluation did confirm influenza A infection. She is doing much better although still somewhat weak and with a nonproductive cough. Her fever has normalized and her white blood cell count remains normal there's been no sputum production. She seems to be improving daily. The patient does live alone but does have 2 sisters that can assist in her post discharge care.  Past Medical History  Diagnosis Date  . Hypertension   . Arthritis   . Hiatal hernia     GERD  . Iron deficiency anemia   . DJD (degenerative joint disease)     Hands  . Depression     History   Social History  . Marital Status: Widowed    Spouse Name: N/A    Number of Children: N/A  . Years of Education: N/A   Occupational History  . Not on file.   Social History Main Topics  . Smoking status: Former Smoker -- 1.0 packs/day  . Smokeless tobacco: Not on file  . Alcohol Use: No  . Drug Use: No  . Sexually Active: Not on file   Other Topics Concern  . 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  Lives independently and able to get around without a cane or walker, still quite healthy and active. Has two sisters who are involved. One son, two grandchildren. Her son currently has a brain tumor, which is causing the patient stress. [name], sister, [phone]. She quit smoking and drinking about 20 yrs ago.     Past Surgical History  Procedure Date  . Orif radial fracture 09/2007    Family History  Problem Relation Age of Onset  . Diabetes Father   . Stroke Father   . Atrial fibrillation Mother     Allergies  Allergen Reactions  . Sulfa Antibiotics Itching    No current facility-administered medications on file prior to encounter.   No current outpatient prescriptions on file prior to encounter.    BP 120/61  Pulse 92  Temp(Src) 98.6 F (37 C) (Oral)  Resp  20  Ht 5\' 4"  (1.626 m)  Wt 61.236 kg (135 lb)  BMI 23.17 kg/m2  SpO2 97%      Objective: Vital signs in last 24 hours: Temp:  [97.6 F (36.4 C)-99.5 F (37.5 C)] 98.6 F (37 C) (12/29 1700) Pulse Rate:  [87-102] 92  (12/29 1700) Resp:  [20] 20  (12/29 1700) BP: (117-129)/(61-70) 120/61 mmHg (12/29 1700) SpO2:  [96 %-98 %] 97 % (12/29 1700) Weight change:  Last BM Date: 09/19/11  Intake/Output from previous day: 12/28 0701 - 12/29 0700 In: 60 [P.O.:60] Out: -  Intake/Output this shift: Total I/O In: 240 [P.O.:240] Out: 100 [Urine:100]  General examination- elderly white female alert appropriate in no distress HEENT exam oropharynx benign because membranes appeared well hydrated Chest clear to auscultation Cardiac exam revealed normal heart sounds without tachycardia Abdomen benign. Soft nontender no distention bowel sounds active Extremities no edema   Lab Results:  Basename 09/20/11 0516 09/19/11 0455  WBC 4.8 6.9  HGB 11.8* 11.2*  HCT 35.8* 34.0*  PLT 264 232   BMET  Basename 09/19/11 0455 09/18/11 2025  NA 134* 133*  K 3.6 3.8  CL 102 97  CO2 24 26  GLUCOSE 107* 102*  BUN 10 12  CREATININE 0.77 0.77  CALCIUM 8.1* 8.7  BP Readings from Last 3 Encounters:  09/20/11 120/61    Studies/Results: Dg Chest 2 View  09/18/2011  *RADIOLOGY REPORT*  Clinical Data: Fever and cough  CHEST - 2 VIEW  Comparison: 05/07/2011  Findings: Heart size appears mildly enlarged.  No pleural effusion or pulmonary edema.  Moderate size hiatal hernia appears similar to previous exam.  There is mild multilevel spondylosis within the thoracic spine.  Moderate degenerative changes involve the right the glenohumeral joint.  IMPRESSION:  1.  No acute cardiopulmonary abnormalities. 2.  Hiatal hernia.  Original Report Authenticated By: Rosealee Albee, M.D.    Medications: I have reviewed the patient's current medications.  Assessment/Plan:  Influenza A. We'll continue  supportive care the patient is improving daily and anticipate discharge tomorrow morning. 2 sisters are available for post discharge assistance Hypertension stable at  LOS: 2 days   Rogelia Boga 09/20/2011, 5:20 PM

## 2011-09-20 NOTE — Plan of Care (Signed)
Problem: Phase III Progression Outcomes Goal: Pain controlled on oral analgesia Outcome: Completed/Met Date Met:  09/20/11 Pt has chest pain with aggressive coughing pain relieved with tylenol

## 2011-09-21 LAB — GLUCOSE, CAPILLARY

## 2011-09-21 MED ORDER — OSELTAMIVIR PHOSPHATE 75 MG PO CAPS: 75.0000 mg | ORAL_CAPSULE | Freq: Two times a day (BID) | ORAL | Status: AC

## 2011-09-21 NOTE — Discharge Summary (Signed)
Physician Discharge Summary  Patient ID: [name] [name] MRN: [id number] DOB/AGE: October 27, 1928 77 y.o.  Admit date: 09/18/2011 Discharge date: 09/21/2011  Discharge Diagnoses:  Influenza A Hypertension Depression   Current Discharge Medication List    START taking these medications   Details  oseltamivir (TAMIFLU) 75 MG capsule Take 1 capsule (75 mg total) by mouth 2 (two) times daily. Qty: 7 capsule, Refills: 0      CONTINUE these medications which have NOT CHANGED   Details  acetaminophen (TYLENOL) 500 MG tablet Take 1,000 mg by mouth 2 (two) times daily.      amLODipine (NORVASC) 5 MG tablet Take 5 mg by mouth daily.      b complex vitamins tablet Take 1 tablet by mouth daily.      FeFum-FePo-FA-B Cmp-C-Zn-Mn-Cu (SE-TAN PLUS) 162-115.2-1 MG CAPS Take 1 capsule by mouth daily.      LORazepam (ATIVAN) 0.5 MG tablet Take 0.5 mg by mouth 3 (three) times daily as needed.     venlafaxine (EFFEXOR-XR) 75 MG 24 hr capsule Take 75-150 mg by mouth 2 (two) times daily. Takes 2 capsule every morning for 150mg  dosage and takes 1 capsule at night for 75mg  dosage         Discharge Orders    Future Orders Please Complete By Expires   Increase activity slowly         Follow-up Information    Follow up with Adaku Nnodi in 1 week. (call for appointment)          Disposition: Home  Discharged Condition:   DNR   Consults:   no consult  HPI:  82yoF with h/o HTN, otherwise fairly healthy presents with influenza-like illness.  Pt and her two sisters report that pt has been coughing for the past couple days, productive of  clear white sputum. Cough has been severe it has caused headaches and nausea. This afternoon  she developed fever to 102.6 at home. She has minimal achiness in her body yesterday and in her  R arm today. Her sisters called PCP's office because earlier today she was reportedly with  decreased mental clarity, saying things that didn't make sense. They were  referred to ED.  In the ED, pt was febrile to 101.2, tachycardic to 104-109, BP stable. O2 high 80's - low 90's  on RA but improves with [place]. Chem panel with Na 133, renal 12/0.77. WBC 9.2 with 78% neutros, 6%  lymphs, 15% monos. Rest of CBC normal. CXR without acute cardiopulmonary abnmls. Pt was given  Tylenol 650, albuterol, fentanyl, 500 cc NS. Admission was requested given her advanced age.  PMH:  As per H & P FH: As per  H & P SH: As per H & P Med: as per H & P Allergies and ROS: as per H&P   Discharge Exam: Blood pressure 109/70, pulse 84, temperature 97.6 F (36.4 C), temperature source Oral, resp. rate 20, height 5\' 4"  (1.626 m), weight 61.236 kg (135 lb), SpO2 92.00%.  General examination- elderly white female alert appropriate in no distress  HEENT exam oropharynx benign because membranes appeared well hydrated  Chest clear to auscultation  Cardiac exam revealed normal heart sounds without tachycardia  Abdomen benign. Soft nontender no distention bowel sounds active  Extremities no edema  Hospital Course: Influenza A Patient tested positive for influenza A. She was admitted to the hospital for supportive care. She was started on Tamiflu. Patient is stable today for discharge. She will continue Tamiflu at home. Her  sisters will check in on her and if she is unable to stay by herself at home, she will go and stay with one of her sisters until she feels better. Patient feels pretty good today.  Hypertension Continue home medication.  Depression Continue home medication.  Labs:   Results for orders placed during the hospital encounter of 09/18/11 (from the past 48 hour(s))  CBC     Status: Abnormal   Collection Time   09/20/11  5:16 AM      Component Value Range Comment   WBC 4.8  4.0 - 10.5 (K/uL)    RBC 3.92  3.87 - 5.11 (MIL/uL)    Hemoglobin 11.8 (*) 12.0 - 15.0 (g/dL)    HCT 66.4 (*) 40.3 - 46.0 (%)    MCV 91.3  78.0 - 100.0 (fL)    MCH 30.1  26.0 - 34.0 (pg)      MCHC 33.0  30.0 - 36.0 (g/dL)    RDW 47.4  25.9 - 56.3 (%)    Platelets 264  150 - 400 (K/uL)   GLUCOSE, CAPILLARY     Status: Normal   Collection Time   09/20/11  7:43 AM      Component Value Range Comment   Glucose-Capillary 83  70 - 99 (mg/dL)   GLUCOSE, CAPILLARY     Status: Normal   Collection Time   09/21/11  7:25 AM      Component Value Range Comment   Glucose-Capillary 96  70 - 99 (mg/dL)    Comment 1 Notify RN       Diagnostics:  Dg Chest 2 View  09/18/2011  *RADIOLOGY REPORT*  Clinical Data: Fever and cough  CHEST - 2 VIEW  Comparison: 05/07/2011  Findings: Heart size appears mildly enlarged.  No pleural effusion or pulmonary edema.  Moderate size hiatal hernia appears similar to previous exam.  There is mild multilevel spondylosis within the thoracic spine.  Moderate degenerative changes involve the right the glenohumeral joint.  IMPRESSION:  1.  No acute cardiopulmonary abnormalities. 2.  Hiatal hernia.  Original Report Authenticated By: Rosealee Albee, M.D.   Time spent with patient in doing this discharge is approximately 45  Signed: Earlene Plater MD, Ladell Pier 09/21/2011, 2:12 PM

## 2011-09-21 NOTE — Progress Notes (Addendum)
Pt. Still has a productive cough, NAD. She ambulates in the room and to the bathroom ,tol. Well. Two IVs removed from Lt. Arm gauze applied. Pt will be discharged to home with sisters. Prescriptions called in to her pharmacy by MD.   DC instructions explained to pt. And her sister.

## 2012-08-25 ENCOUNTER — Other Ambulatory Visit: Payer: Self-pay | Admitting: Family Medicine

## 2012-08-25 DIAGNOSIS — R1032 Left lower quadrant pain: Secondary | ICD-10-CM

## 2012-08-27 ENCOUNTER — Ambulatory Visit
Admission: RE | Admit: 2012-08-27 | Discharge: 2012-08-27 | Disposition: A | Discharge status: Home / Self Care | Payer: Medicare Other | Source: Ambulatory Visit | Attending: Family Medicine | Admitting: Family Medicine

## 2012-08-27 DIAGNOSIS — R1032 Left lower quadrant pain: Secondary | ICD-10-CM

## 2012-08-27 MED ORDER — IOHEXOL 300 MG/ML  SOLN
100.0000 mL | Freq: Once | INTRAMUSCULAR | Status: AC | PRN
  Administered 2012-08-27: 100 mL via INTRAVENOUS

## 2012-10-19 ENCOUNTER — Emergency Department (HOSPITAL_COMMUNITY): Payer: Medicare Other

## 2012-10-19 ENCOUNTER — Inpatient Hospital Stay (HOSPITAL_COMMUNITY)
Admission: EM | Admit: 2012-10-19 | Discharge: 2012-10-25 | DRG: 562 | Disposition: A | Discharge status: Skilled Nursing Facility | Payer: Medicare Other | Attending: Internal Medicine | Admitting: Internal Medicine

## 2012-10-19 ENCOUNTER — Encounter (HOSPITAL_COMMUNITY): Payer: Self-pay | Admitting: *Deleted

## 2012-10-19 DIAGNOSIS — K219 Gastro-esophageal reflux disease without esophagitis: Secondary | ICD-10-CM | POA: Diagnosis present

## 2012-10-19 DIAGNOSIS — S92354A Nondisplaced fracture of fifth metatarsal bone, right foot, initial encounter for closed fracture: Secondary | ICD-10-CM

## 2012-10-19 DIAGNOSIS — S92309A Fracture of unspecified metatarsal bone(s), unspecified foot, initial encounter for closed fracture: Principal | ICD-10-CM | POA: Diagnosis present

## 2012-10-19 DIAGNOSIS — S065X9A Traumatic subdural hemorrhage with loss of consciousness of unspecified duration, initial encounter: Secondary | ICD-10-CM

## 2012-10-19 DIAGNOSIS — R296 Repeated falls: Secondary | ICD-10-CM

## 2012-10-19 DIAGNOSIS — S065X0A Traumatic subdural hemorrhage without loss of consciousness, initial encounter: Secondary | ICD-10-CM | POA: Diagnosis present

## 2012-10-19 DIAGNOSIS — M1990 Unspecified osteoarthritis, unspecified site: Secondary | ICD-10-CM | POA: Diagnosis present

## 2012-10-19 DIAGNOSIS — K449 Diaphragmatic hernia without obstruction or gangrene: Secondary | ICD-10-CM | POA: Diagnosis present

## 2012-10-19 DIAGNOSIS — E440 Moderate protein-calorie malnutrition: Secondary | ICD-10-CM | POA: Diagnosis present

## 2012-10-19 DIAGNOSIS — Z9181 History of falling: Secondary | ICD-10-CM

## 2012-10-19 DIAGNOSIS — I10 Essential (primary) hypertension: Secondary | ICD-10-CM | POA: Diagnosis present

## 2012-10-19 DIAGNOSIS — F329 Major depressive disorder, single episode, unspecified: Secondary | ICD-10-CM | POA: Diagnosis present

## 2012-10-19 DIAGNOSIS — F3289 Other specified depressive episodes: Secondary | ICD-10-CM | POA: Diagnosis present

## 2012-10-19 DIAGNOSIS — J189 Pneumonia, unspecified organism: Secondary | ICD-10-CM

## 2012-10-19 DIAGNOSIS — F0390 Unspecified dementia without behavioral disturbance: Secondary | ICD-10-CM | POA: Diagnosis present

## 2012-10-19 DIAGNOSIS — W19XXXA Unspecified fall, initial encounter: Secondary | ICD-10-CM | POA: Diagnosis present

## 2012-10-19 DIAGNOSIS — Z66 Do not resuscitate: Secondary | ICD-10-CM | POA: Diagnosis present

## 2012-10-19 DIAGNOSIS — S92353A Displaced fracture of fifth metatarsal bone, unspecified foot, initial encounter for closed fracture: Secondary | ICD-10-CM | POA: Diagnosis present

## 2012-10-19 LAB — CBC WITH DIFFERENTIAL/PLATELET
Basophils Absolute: 0.1 10*3/uL (ref 0.0–0.1)
Lymphocytes Relative: 15 % (ref 12–46)
Neutro Abs: 7.9 10*3/uL — ABNORMAL HIGH (ref 1.7–7.7)
Neutrophils Relative %: 72 % (ref 43–77)
Platelets: 334 10*3/uL (ref 150–400)
RDW: 13.2 % (ref 11.5–15.5)
WBC: 11 10*3/uL — ABNORMAL HIGH (ref 4.0–10.5)

## 2012-10-19 LAB — COMPREHENSIVE METABOLIC PANEL
ALT: 10 U/L (ref 0–35)
AST: 20 U/L (ref 0–37)
Alkaline Phosphatase: 113 U/L (ref 39–117)
CO2: 25 mEq/L (ref 19–32)
Calcium: 9.1 mg/dL (ref 8.4–10.5)
Chloride: 102 mEq/L (ref 96–112)
GFR calc non Af Amer: 57 mL/min — ABNORMAL LOW (ref >90)
Potassium: 3.9 mEq/L (ref 3.5–5.1)
Sodium: 139 mEq/L (ref 135–145)
Total Bilirubin: 0.2 mg/dL — ABNORMAL LOW (ref 0.3–1.2)

## 2012-10-19 LAB — URINE MICROSCOPIC-ADD ON

## 2012-10-19 LAB — URINALYSIS, ROUTINE W REFLEX MICROSCOPIC
Bilirubin Urine: NEGATIVE
Nitrite: NEGATIVE
Specific Gravity, Urine: 1.027 (ref 1.005–1.030)
pH: 6 (ref 5.0–8.0)

## 2012-10-19 LAB — PROTIME-INR: INR: 0.91 (ref 0.00–1.49)

## 2012-10-19 MED ORDER — VENLAFAXINE HCL ER 75 MG PO CP24
75.0000 mg | ORAL_CAPSULE | Freq: Two times a day (BID) | ORAL | Status: DC
  Administered 2012-10-20 (×2): 75 mg via ORAL
  Filled 2012-10-19 (×3): qty 2

## 2012-10-19 MED ORDER — SODIUM CHLORIDE 0.9 % IV SOLN: 250.0000 mL | INTRAVENOUS | Status: DC | PRN

## 2012-10-19 MED ORDER — ZOLPIDEM TARTRATE 5 MG PO TABS: 5.0000 mg | ORAL_TABLET | Freq: Every evening | ORAL | Status: DC | PRN

## 2012-10-19 MED ORDER — ONDANSETRON HCL 4 MG PO TABS
4.0000 mg | ORAL_TABLET | Freq: Four times a day (QID) | ORAL | Status: DC | PRN
  Administered 2012-10-20: 4 mg via ORAL
  Filled 2012-10-19: qty 1

## 2012-10-19 MED ORDER — ONDANSETRON HCL 4 MG/2ML IJ SOLN: 4.0000 mg | Freq: Four times a day (QID) | INTRAMUSCULAR | Status: DC | PRN

## 2012-10-19 MED ORDER — ACETAMINOPHEN 650 MG RE SUPP: 650.0000 mg | Freq: Four times a day (QID) | RECTAL | Status: DC | PRN

## 2012-10-19 MED ORDER — SODIUM CHLORIDE 0.9 % IJ SOLN
3.0000 mL | Freq: Two times a day (BID) | INTRAMUSCULAR | Status: DC
  Administered 2012-10-20 – 2012-10-25 (×7): 3 mL via INTRAVENOUS

## 2012-10-19 MED ORDER — SODIUM CHLORIDE 0.9 % IJ SOLN: 3.0000 mL | INTRAMUSCULAR | Status: DC | PRN

## 2012-10-19 MED ORDER — ACETAMINOPHEN 325 MG PO TABS: 650.0000 mg | ORAL_TABLET | Freq: Four times a day (QID) | ORAL | Status: DC | PRN

## 2012-10-19 MED ORDER — AMLODIPINE BESYLATE 5 MG PO TABS
5.0000 mg | ORAL_TABLET | Freq: Every day | ORAL | Status: DC
  Administered 2012-10-20 – 2012-10-25 (×6): 5 mg via ORAL
  Filled 2012-10-19 (×7): qty 1

## 2012-10-19 MED ORDER — ALBUTEROL SULFATE (5 MG/ML) 0.5% IN NEBU
2.5000 mg | INHALATION_SOLUTION | Freq: Four times a day (QID) | RESPIRATORY_TRACT | Status: DC
  Administered 2012-10-20 (×2): 2.5 mg via RESPIRATORY_TRACT
  Filled 2012-10-19 (×2): qty 0.5

## 2012-10-19 MED ORDER — LEVOFLOXACIN IN D5W 750 MG/150ML IV SOLN
750.0000 mg | INTRAVENOUS | Status: DC
  Filled 2012-10-19: qty 150

## 2012-10-19 MED ORDER — ALBUTEROL SULFATE (5 MG/ML) 0.5% IN NEBU: 2.5000 mg | INHALATION_SOLUTION | RESPIRATORY_TRACT | Status: DC | PRN

## 2012-10-19 MED ORDER — ENOXAPARIN SODIUM 40 MG/0.4ML ~~LOC~~ SOLN
40.0000 mg | SUBCUTANEOUS | Status: DC
  Administered 2012-10-20: 40 mg via SUBCUTANEOUS
  Filled 2012-10-19 (×2): qty 0.4

## 2012-10-19 MED ORDER — MORPHINE SULFATE 2 MG/ML IJ SOLN: 1.0000 mg | INTRAMUSCULAR | Status: DC | PRN

## 2012-10-19 MED ORDER — SODIUM CHLORIDE 0.9 % IJ SOLN
3.0000 mL | Freq: Two times a day (BID) | INTRAMUSCULAR | Status: DC
  Administered 2012-10-19 – 2012-10-23 (×4): 3 mL via INTRAVENOUS

## 2012-10-19 MED ORDER — B COMPLEX PO TABS: 1.0000 | ORAL_TABLET | Freq: Every day | ORAL | Status: DC

## 2012-10-19 MED ORDER — ALUM & MAG HYDROXIDE-SIMETH 200-200-20 MG/5ML PO SUSP: 30.0000 mL | Freq: Four times a day (QID) | ORAL | Status: DC | PRN

## 2012-10-19 MED ORDER — MECLIZINE HCL 12.5 MG PO TABS
12.5000 mg | ORAL_TABLET | Freq: Three times a day (TID) | ORAL | Status: DC | PRN
  Filled 2012-10-19: qty 1

## 2012-10-19 MED ORDER — LEVOFLOXACIN IN D5W 500 MG/100ML IV SOLN
500.0000 mg | Freq: Once | INTRAVENOUS | Status: AC
  Administered 2012-10-19: 500 mg via INTRAVENOUS
  Filled 2012-10-19: qty 100

## 2012-10-19 MED ORDER — B COMPLEX-C PO TABS
1.0000 | ORAL_TABLET | Freq: Every day | ORAL | Status: DC
  Administered 2012-10-20 – 2012-10-25 (×6): 1 via ORAL
  Filled 2012-10-19 (×6): qty 1

## 2012-10-19 MED ORDER — TRAMADOL HCL 50 MG PO TABS
50.0000 mg | ORAL_TABLET | Freq: Two times a day (BID) | ORAL | Status: DC | PRN
  Administered 2012-10-20: 50 mg via ORAL
  Filled 2012-10-19 (×2): qty 1

## 2012-10-19 NOTE — ED Notes (Signed)
Delay explained to PT son.

## 2012-10-19 NOTE — Consult Note (Signed)
Consult requested from ER physicians.  Patient with right foot fracture seen on outpatient X-ray, being admitted to medicine with multiple falls.   Xray reviewed, demonstrates displaced 5th metatarsal shaft fracture.    Would not recommend surgery based on x-ray, due to age of patient and expected bone quality, but she may benefit from a cast, vs. Splint with hard sole shoe.  It will be difficult for her to be non-weight bearing, and may increase risk for falls, so we will plan to allow weight bearing, although she may not tolerate much, and protect with either a hard sole shoe, boot, or cast.  Will discuss with patient in am, and full consult to follow.  Eulas Post, MD 9:40 PM

## 2012-10-19 NOTE — ED Notes (Signed)
Pt reports has been having intermittent dizzy spells causing her to fall past month. States episodes occur 1-2 times weekly &  last less than a minute. Denies LOC, CP, palpitations, visual changes, h/a.  Son reports pt has been told to use a walker/cane but pt refuses to use it.

## 2012-10-19 NOTE — Progress Notes (Signed)
Orthopedic Tech Progress Note Patient Details:  [name] [name] 23-Nov-1928 [id number]  Ortho Devices Type of Ortho Device: Post (short) splint Splint Material: Fiberglass Ortho Device/Splint Location: right leg Ortho Device/Splint Interventions: Application   Nikki Dom 10/19/2012, 10:46 PM

## 2012-10-19 NOTE — ED Notes (Addendum)
PT was sent here from [facility]) by Dr Barbaraann Barthel to be admitted for a R foot fx.  The reason she would like her to be admitted is the state of the fracture as well as the fact that the pt cannot get a walker or a wc into her house and Dr Barbaraann Barthel does not feel pt can care for herself at this time, especially since she has been falling a lot recently.  Pt states recently that "my head feels swimmy and then I fall".  Last night pt awoke at 3 am, bumped into a table holding a heavy plant, and plant fell on R foot. Pt AO X 4.  Pt also c/o RLQ pain and constipation.

## 2012-10-19 NOTE — ED Provider Notes (Signed)
History  This chart was scribed for Glynn Octave, MD by Bennett Scrape, ED Scribe. This patient was seen in room B17C/B17C and the patient's care was started at 6:29 PM.  CSN: [id number]  Arrival date & time 10/19/12  1810   First MD Initiated Contact with Patient 10/19/12 1829      Chief Complaint  Patient presents with  . Foot Injury     The history is provided by the patient. No language interpreter was used.    [name] [name] is a 79 y.o. female who presents to the Emergency Department complaining of a right foot injury that occurred approximately 12 hours ago. Pt states that she got up to get a glass of water, felt dizzy and bumped into a table holding a heavy potted plant that then landed on her right foot. She was seen by her PCP today for the same and was diagnosed with a right foot fx. Pt denies pain and reports that she has been ambulating on it since the injury. Pt is here for admission or rehab placement due to son's concerns over pt's home being cluttered with no clear pathway and pt's refusal to use a walker or cane as advised to do. Son reports that the pt has fallen 4 or 5 times in the past month due to nondescript dizziness and has an appointment with ENT next week for falling. He denies a h/o frequent falls. Pt denies being on coumadin or ASA currently. She denies head trauma or LOC from the fall last night. She denies hip pain, CP, new abdominal pain, nausea, emesis and HA as associated symptoms. She has a h/o HTN, depression and arthritis. She is a former smoker but denies alcohol use.  Past Medical History  Diagnosis Date  . Hypertension   . Arthritis   . Hiatal hernia     GERD  . Iron deficiency anemia   . DJD (degenerative joint disease)     Hands  . Depression     Past Surgical History  Procedure Date  . Orif radial fracture 09/2007    Family History  Problem Relation Age of Onset  . Diabetes Father   . Stroke Father   . Atrial fibrillation Mother       History  Substance Use Topics  . Smoking status: Former Smoker -- 1.0 packs/day  . Smokeless tobacco: Not on file  . Alcohol Use: No   No OB history provided.  Review of Systems  A complete 10 system review of systems was obtained and all systems are negative except as noted in the HPI and PMH.   Allergies  Sulfa antibiotics  Home Medications   Current Outpatient Rx  Name  Route  Sig  Dispense  Refill  . ACETAMINOPHEN 500 MG PO TABS   Oral   Take 1,000 mg by mouth 2 (two) times daily.           Marland Kitchen AMLODIPINE BESYLATE 5 MG PO TABS   Oral   Take 5 mg by mouth daily.           . B COMPLEX PO TABS   Oral   Take 1 tablet by mouth daily.           Marland Kitchen SE-TAN PLUS 162-115.2-1 MG PO CAPS   Oral   Take 1 capsule by mouth daily.           Marland Kitchen LORAZEPAM 0.5 MG PO TABS   Oral   Take 0.5 mg by mouth 3 (  three) times daily as needed.          . VENLAFAXINE HCL ER 75 MG PO CP24   Oral   Take 75-150 mg by mouth 2 (two) times daily. Takes 2 capsule every morning for 150mg  dosage and takes 1 capsule at night for 75mg  dosage            Triage Vitals: BP 161/86  Pulse 90  Temp 97.9 F (36.6 C) (Oral)  Resp 18  SpO2 97%  Physical Exam  Nursing note and vitals reviewed. Constitutional: She is oriented to person, place, and time. She appears well-developed and well-nourished. No distress.  HENT:  Head: Normocephalic and atraumatic.  Eyes: Conjunctivae normal and EOM are normal.  Neck: Neck supple. No tracheal deviation present.  Cardiovascular: Normal rate.   Pulmonary/Chest: Effort normal. No respiratory distress.  Abdominal: Soft. There is no tenderness.  Musculoskeletal: Normal range of motion. She exhibits edema.       +2 DP and PT pulses, extensive ecchymosis on the dorsal aspect of the right foot extending from the lateral side, pt able to wiggle right toes, normal ROM of the right ankle  Neurological: She is alert and oriented to person, place, and time.   Skin: Skin is warm and dry.       Old ecchymosis on the right forehead and right cheek with old healed laceration to right temple  Psychiatric: She has a normal mood and affect. Her behavior is normal.    ED Course  Procedures (including critical care time)  DIAGNOSTIC STUDIES: Oxygen Saturation is 97% on room air, adequate by my interpretation.    COORDINATION OF CARE: 6:41 PM-Discussed treatment plan which includes CT of head, CXR, CBC panel, and UA with pt at bedside and pt agreed to plan.   9:26 PM-Consult complete with Dr. March Rummage, Hospitalist. Patient case explained and discussed. Dr. March Rummage agrees to admit patient for further evaluation and treatment. Call ended at 9:27 PM.  9:34 PM-Consult complete with Dr. Charma Igo, orthopedist. Patient case explained and discussed. Dr. Charma Igo agrees to consult with the pt in the morning. Call ended at 9:35 PM.  Labs Reviewed  CBC WITH DIFFERENTIAL - Abnormal; Notable for the following:    WBC 11.0 (*)     Neutro Abs 7.9 (*)     Monocytes Absolute 1.3 (*)     All other components within normal limits  COMPREHENSIVE METABOLIC PANEL - Abnormal; Notable for the following:    Glucose, Bld 100 (*)     Total Bilirubin 0.2 (*)     GFR calc non Af Amer 57 (*)     GFR calc Af Amer 66 (*)     All other components within normal limits  TROPONIN I  PROTIME-INR  URINALYSIS, ROUTINE W REFLEX MICROSCOPIC   Ct Head Wo Contrast  10/19/2012  *RADIOLOGY REPORT*  Clinical Data: Multiple falls with bruising to the right face.  CT HEAD WITHOUT CONTRAST  Technique:  Contiguous axial images were obtained from the base of the skull through the vertex without contrast.  Comparison: CT head 01/19/2008.  Findings: There is no evidence for acute infarction, intracranial hemorrhage, mass lesion, hydrocephalus, or extra-axial fluid.  Mild to moderate atrophy has progressed from 2009.  There is chronic microvascular ischemic change in the periventricular and subcortical  white matter.  Carotid atherosclerosis is prominently noted in the siphon regions.  There is no visible skull fracture or sinus air fluid level.  Negative orbits.  IMPRESSION: Progressive atrophy and  chronic microvascular ischemic change.  No skull fracture or intracranial hemorrhage.   Original Report Authenticated By: Davonna Belling, M.D.    Dg Abd Acute W/chest  10/19/2012  *RADIOLOGY REPORT*  Clinical Data: 79 year old female with constipation.  ACUTE ABDOMEN SERIES (ABDOMEN 2 VIEW & CHEST 1 VIEW)  Comparison: 08/06/2012 and prior radiographs. 08/27/2012 abdominal CT.  Findings: The cardiomediastinal silhouette is unremarkable. COPD/emphysema is identified.  An ill-defined focal opacity overlying the right lower lung is identified and could represent a small focal area of airspace disease/pneumonia or overlapping structures.  There is no abnormality identified on the recent CT. There is no evidence of pleural effusion or pneumothorax.  The bowel gas pattern is unremarkable. There is no evidence of bowel obstruction or pneumoperitoneum. No suspicious calcifications are identified. Degenerative changes of the lumbar spine are noted.  IMPRESSION: Unremarkable bowel gas pattern.  Ill-defined focal opacity overlying the right lower lung possibly representing focal pneumonia.   Original Report Authenticated By: Harmon Pier, M.D.      No diagnosis found.    MDM  Patient presents from PCPs office with right metatarsal fracture. She's been having frequent falls and her doctor think she cannot care for herself at this time. She denies any loss of consciousness. She complains of pain to her right foot admits to having intermittent swimmy head episodes.  Outpatient x-ray shows comminuted right fifth metatarsal fracture. The head is negative. Patient's son endorsed multiple frequent falls with associated "swimmy headed" feeling in the head and decreased hearing. She has ENT followup on February 4.  Patient's PCP  feels she is unsafe to return home given her recurrent falls and foot fracture. She'll require placement seems. Her x-ray is remarkable for possible pneumonia the patient does endorse cough she'll be started on IV Levaquin. Contrary to the nursing note, patient denies any abdominal pain.    Date: 10/19/2012  Rate: 89  Rhythm: normal sinus rhythm  QRS Axis: normal  Intervals: normal  ST/T Wave abnormalities: normal  Conduction Disutrbances:right bundle branch block  Narrative Interpretation:   Old EKG Reviewed: unchanged   I personally performed the services described in this documentation, which was scribed in my presence. The recorded information has been reviewed and is accurate.   Glynn Octave, MD 10/19/12 [phone]

## 2012-10-19 NOTE — ED Notes (Signed)
Hospitalist in room with pt. 

## 2012-10-19 NOTE — ED Notes (Signed)
Patient transported to X-ray 

## 2012-10-19 NOTE — ED Notes (Signed)
When asked if she could provide a urine sample, pt's son stated that she has not been able to urinate much for the past 3 days, and that her bowel movements have been very hard.  Son stated that his mother had been drinking plenty of fluids, and this was not nessisarily abnormal for her.  MD made aware

## 2012-10-19 NOTE — H&P (Signed)
[facility]                                                                                    Patient Demographics  [name] [name], is a 79 y.o. female  CSN: [id number]  MRN: [id number]  DOB - 1929-09-19  Admit Date - 10/19/2012  Outpatient Primary MD for the patient is NNODI, Alesia Richards, MD   With History of -  Past Medical History  Diagnosis Date  . Hypertension   . Arthritis   . Hiatal hernia     GERD  . Iron deficiency anemia   . DJD (degenerative joint disease)     Hands  . Depression       Past Surgical History  Procedure Date  . Orif radial fracture 09/2007    in for   Chief Complaint  Patient presents with  . Foot Injury     HPI  [name] [name]  is a 79 y.o. female, with history of hypertension and arthritis, brought today to the emergency room from her doctor's office to be evaluated for increased falls. Her son was in the room and gave most of the history. The patient had right foot trauma after she hit her foot on a flower vase. Her son reports 5 falls in the last 2 weeks. Patient reports a "swimming feeling"in her head, and decreased hearing. She was supposed to follow up with ENT on February 4. No vertigo, no nausea or vomiting. Patient reports feeling congested today as if she is getting a cold. No history of loss of consciousness.    Review of Systems    In addition to the HPI above,  No Fever-chills, No Headache, No changes with Vision , No problems swallowing food or Liquids, No Chest pain, Cough or Shortness of Breath, No Abdominal pain, No Nausea or Vommitting, Bowel movements are regular, No Blood in stool or Urine, No dysuria, No new skin rashes or ,,  No new weakness, tingling, numbness in any extremity, No recent weight gain or loss, No polyuria, polydypsia or polyphagia, No significant Mental Stressors.  A full 10 point Review of Systems was done, except as stated above, all other Review of Systems were  negative.   Social History History  Substance Use Topics  . Smoking status: Former Smoker -- 1.0 packs/day  . Smokeless tobacco: Not on file  . Alcohol Use: No     Family History Family History  Problem Relation Age of Onset  . Diabetes Father   . Stroke Father   . Atrial fibrillation Mother      Prior to Admission medications   Medication Sig Start Date End Date Taking? Authorizing Provider  amLODipine (NORVASC) 5 MG tablet Take 5 mg by mouth daily.     Yes Historical Provider, MD  b complex vitamins tablet Take 1 tablet by mouth daily.     Yes Historical Provider, MD  bisacodyl (DULCOLAX) 5 MG EC tablet Take 5 mg by mouth daily as needed.   Yes Historical Provider, MD  cholecalciferol (VITAMIN D) 1000 UNITS tablet Take 1,000 Units by mouth daily.   Yes Historical Provider, MD  FeFum-FePo-FA-B Cmp-C-Zn-Mn-Cu (SE-TAN PLUS) 162-115.2-1 MG  CAPS Take 1 capsule by mouth daily.     Yes Historical Provider, MD  loratadine (CLARITIN) 10 MG tablet Take 10 mg by mouth daily as needed. For allergies   Yes Historical Provider, MD  sodium phosphate (FLEET) enema Place 1 enema rectally once. follow package directions   Yes Historical Provider, MD  traMADol (ULTRAM) 50 MG tablet Take 50 mg by mouth 2 (two) times daily as needed. For pain   Yes Historical Provider, MD  venlafaxine (EFFEXOR-XR) 75 MG 24 hr capsule Take 75-150 mg by mouth 2 (two) times daily. Takes 2 capsule every morning for 150mg  dosage and takes 1 capsule at night for 75mg  dosage   Yes Historical Provider, MD    Allergies  Allergen Reactions  . Sulfa Antibiotics Itching    Physical Exam  Vitals  Blood pressure 128/75, pulse 88, temperature 97.9 F (36.6 C), temperature source Oral, resp. rate 13, SpO2 98.00%.   1. General elderly white American female, very pleasant, in no acute distress, hard of hearing.  2. Normal affect and insight, Not Suicidal or Homicidal, Awake Alert, Oriented X 3.  3. No F.N deficits, ALL  C.Nerves Intact, Strength 5/5 all 4 extremities, Sensation intact all 4 extremities, Plantars down going.  4. Ears and Eyes appear Normal, Conjunctivae clear, PERRLA. Moist Oral Mucosa.  5. Supple Neck, No JVD, No cervical lymphadenopathy appriciated, No Carotid Bruits.  6. Symmetrical Chest wall movement, Good air movement bilaterally, CTAB.  7. RRR, No Gallops, Rubs or Murmurs, No Parasternal Heave.  8. Positive Bowel Sounds, Abdomen Soft, Non tender, No organomegaly appriciated,No rebound -guarding or rigidity.  9.  No Cyanosis, Normal Skin Turgor, right facial ecchymosis that involves day for head in the right cheek with a laceration repaired..  10. Good muscle tone,  joints appear normal , no effusions, Normal ROM.  11. No Palpable Lymph Nodes in Neck or Axillae  12. Extremities: No swelling  of the lower extremities, right foot ecchymosis with tenderness    Data Review  CBC  Lab 10/19/12 1847  WBC 11.0*  HGB 14.1  HCT 42.3  PLT 334  MCV 91.8  MCH 30.6  MCHC 33.3  RDW 13.2  LYMPHSABS 1.6  MONOABS 1.3*  EOSABS 0.2  BASOSABS 0.1  BANDABS --   ------------------------------------------------------------------------------------------------------------------  Chemistries   Lab 10/19/12 1847  NA 139  K 3.9  CL 102  CO2 25  GLUCOSE 100*  BUN 16  CREATININE 0.92  CALCIUM 9.1  MG --  AST 20  ALT 10  ALKPHOS 113  BILITOT 0.2*   ------------------------------------------------------------------------------------------------------------------   Coagulation profile  Lab 10/19/12 1847  INR 0.91  PROTIME --   ------------------------------------------------------------------------------------------------------------------- No results found for this basename: DDIMER:2 in the last 72 hours -------------------------------------------------------------------------------------------------------------------  Cardiac Enzymes  Lab 10/19/12 1848  CKMB --   TROPONINI <0.30  MYOGLOBIN --   ------------------------------------------------------------------------------------------------------------------   ---------------------------------------------------------------------------------------------------------------  Urinalysis    Component Value Date/Time   COLORURINE YELLOW 09/19/2011 0215   APPEARANCEUR CLEAR 09/19/2011 0215   LABSPEC 1.007 09/19/2011 0215   PHURINE 6.0 09/19/2011 0215   GLUCOSEU NEGATIVE 09/19/2011 0215   HGBUR TRACE* 09/19/2011 0215   BILIRUBINUR NEGATIVE 09/19/2011 0215   KETONESUR NEGATIVE 09/19/2011 0215   PROTEINUR NEGATIVE 09/19/2011 0215   UROBILINOGEN 0.2 09/19/2011 0215   NITRITE NEGATIVE 09/19/2011 0215   LEUKOCYTESUR NEGATIVE 09/19/2011 0215    ----------------------------------------------------------------------------------------------------------------     Imaging results:   Ct Head Wo Contrast  10/19/2012  *RADIOLOGY REPORT*  Clinical Data: Multiple  falls with bruising to the right face.  CT HEAD WITHOUT CONTRAST  Technique:  Contiguous axial images were obtained from the base of the skull through the vertex without contrast.  Comparison: CT head 01/19/2008.  Findings: There is no evidence for acute infarction, intracranial hemorrhage, mass lesion, hydrocephalus, or extra-axial fluid.  Mild to moderate atrophy has progressed from 2009.  There is chronic microvascular ischemic change in the periventricular and subcortical white matter.  Carotid atherosclerosis is prominently noted in the siphon regions.  There is no visible skull fracture or sinus air fluid level.  Negative orbits.  IMPRESSION: Progressive atrophy and chronic microvascular ischemic change.  No skull fracture or intracranial hemorrhage.   Original Report Authenticated By: Davonna Belling, M.D.    Dg Abd Acute W/chest  10/19/2012  *RADIOLOGY REPORT*  Clinical Data: 79 year old female with constipation.  ACUTE ABDOMEN SERIES (ABDOMEN 2 VIEW &  CHEST 1 VIEW)  Comparison: 08/06/2012 and prior radiographs. 08/27/2012 abdominal CT.  Findings: The cardiomediastinal silhouette is unremarkable. COPD/emphysema is identified.  An ill-defined focal opacity overlying the right lower lung is identified and could represent a small focal area of airspace disease/pneumonia or overlapping structures.  There is no abnormality identified on the recent CT. There is no evidence of pleural effusion or pneumothorax.  The bowel gas pattern is unremarkable. There is no evidence of bowel obstruction or pneumoperitoneum. No suspicious calcifications are identified. Degenerative changes of the lumbar spine are noted.  IMPRESSION: Unremarkable bowel gas pattern.  Ill-defined focal opacity overlying the right lower lung possibly representing focal pneumonia.   Original Report Authenticated By: Harmon Pier, M.D.     My personal review of EKG: Normal sinus rhythm with 89 beats per minute/right bundle branch block    Assessment & Plan  1. multiple falls lately with"swimming feeling"in her head. 2. Right fifth metatarsal fracture, traumatic, orthopedics consult called 3. Community acquired pneumonia 4. Decreased hearing 5. History of hypertension. arthritis and depression  Plan Admit the patient to telemetry Start IV antibiotics/Levaquin Orthopedics was called for consult Pain control Check MRI of the brain in am May benefit from an ENT consult in the morning because of decreased hearing and chronic dizziness Check vitamin B12, TSH Social worker consult for placement, in a.m.    DVT Prophylaxis Lovenox  AM Labs Ordered, also please review Full Orders  Family Communication: Admission, patients condition and plan of care including tests being ordered have been discussed with the patient and son who indicate understanding and agree with the plan and Code Status.  Code Status DO NOT RESUSCITATE  Disposition Plan: We'll be admitted to telemetry, then  discharged either home with home health or to a nursing home facility  Time spent in minutes : 35 minutes  Condition GUARDED

## 2012-10-20 ENCOUNTER — Inpatient Hospital Stay (HOSPITAL_COMMUNITY): Payer: Medicare Other

## 2012-10-20 ENCOUNTER — Observation Stay (HOSPITAL_COMMUNITY): Payer: Medicare Other

## 2012-10-20 DIAGNOSIS — F329 Major depressive disorder, single episode, unspecified: Secondary | ICD-10-CM

## 2012-10-20 DIAGNOSIS — Z9181 History of falling: Secondary | ICD-10-CM

## 2012-10-20 LAB — TROPONIN I
Troponin I: <0.3 ng/mL (ref <0.30)
Troponin I: <0.3 ng/mL

## 2012-10-20 LAB — CBC
MCH: 30.2 pg (ref 26.0–34.0)
MCHC: 33.5 g/dL (ref 30.0–36.0)
Platelets: 325 10*3/uL (ref 150–400)
RDW: 12.9 % (ref 11.5–15.5)

## 2012-10-20 LAB — GLUCOSE, CAPILLARY
Glucose-Capillary: 115 mg/dL — ABNORMAL HIGH (ref 70–99)
Glucose-Capillary: 99 mg/dL (ref 70–99)

## 2012-10-20 LAB — BASIC METABOLIC PANEL WITH GFR
BUN: 14 mg/dL (ref 6–23)
CO2: 24 meq/L (ref 19–32)
Calcium: 8.7 mg/dL (ref 8.4–10.5)
Chloride: 102 meq/L (ref 96–112)
Creatinine, Ser: 0.91 mg/dL (ref 0.50–1.10)
GFR calc Af Amer: 67 mL/min — ABNORMAL LOW
GFR calc non Af Amer: 58 mL/min — ABNORMAL LOW
Glucose, Bld: 111 mg/dL — ABNORMAL HIGH (ref 70–99)
Potassium: 3.4 meq/L — ABNORMAL LOW (ref 3.5–5.1)
Sodium: 140 meq/L (ref 135–145)

## 2012-10-20 LAB — LEGIONELLA ANTIGEN, URINE: Legionella Antigen, Urine: NEGATIVE

## 2012-10-20 LAB — VITAMIN B12: Vitamin B-12: 616 pg/mL (ref 211–911)

## 2012-10-20 LAB — TSH: TSH: 3.082 u[IU]/mL (ref 0.350–4.500)

## 2012-10-20 MED ORDER — LEVOFLOXACIN 750 MG PO TABS: 750.0000 mg | ORAL_TABLET | ORAL | Status: DC

## 2012-10-20 MED ORDER — POTASSIUM CHLORIDE CRYS ER 20 MEQ PO TBCR
40.0000 meq | EXTENDED_RELEASE_TABLET | ORAL | Status: AC
  Administered 2012-10-20 (×2): 40 meq via ORAL
  Filled 2012-10-20 (×2): qty 2

## 2012-10-20 MED ORDER — VENLAFAXINE HCL ER 75 MG PO CP24
75.0000 mg | ORAL_CAPSULE | Freq: Two times a day (BID) | ORAL | Status: DC
  Administered 2012-10-20 – 2012-10-25 (×10): 75 mg via ORAL
  Filled 2012-10-20 (×13): qty 1

## 2012-10-20 MED ORDER — BACITRACIN 500 UNIT/GM EX OINT
1.0000 "application " | TOPICAL_OINTMENT | Freq: Two times a day (BID) | CUTANEOUS | Status: DC
  Filled 2012-10-20: qty 0.9

## 2012-10-20 MED ORDER — ENSURE COMPLETE PO LIQD
237.0000 mL | Freq: Two times a day (BID) | ORAL | Status: DC
  Administered 2012-10-20 – 2012-10-25 (×9): 237 mL via ORAL

## 2012-10-20 MED ORDER — GADOBENATE DIMEGLUMINE 529 MG/ML IV SOLN
14.0000 mL | Freq: Once | INTRAVENOUS | Status: AC
  Administered 2012-10-20: 14 mL via INTRAVENOUS

## 2012-10-20 NOTE — Progress Notes (Signed)
Responsible party: Adine Madura: [phone]. Harmon Pier

## 2012-10-20 NOTE — Progress Notes (Signed)
Utilization Review Completed.[name] [name] T12/05/2013  

## 2012-10-20 NOTE — Care Management Note (Signed)
    Page 1 of 1   10/26/2012     4:45:30 PM   CARE MANAGEMENT NOTE 10/26/2012  Patient:  [name] [name]   Account Number:  [id number]  Date Initiated:  10/20/2012  Documentation initiated by:  Aliviah Spain  Subjective/Objective Assessment:   PT ADM WITH RT FOOT METATARSAL FRACTURE, MULT FALLS, ?PNA. PTA, PT INDEPENDENT, LIVES HOME ALONE.  SHE HAS FALLEN MULTIPLE TIMES IN THE LAST FEW MONTHS, PER SON.     Action/Plan:   MET WITH PT AND SON TO DISCUSS DC PLANS.  THEY ARE REQUESTING [facility] FOR REHAB.  WILL CONSULT CSW TO FACILITATE DC TO SNF WHEN MEDICALLY STABLE.  PREFER [facility] AS FIRST CHOICE.   Anticipated DC Date:  10/21/2012   Anticipated DC Plan:  SKILLED NURSING FACILITY  In-house referral  Clinical Social Worker      DC Planning Services  CM consult      Choice offered to / List presented to:             Status of service:  Completed, signed off Medicare Important Message given?   (If response is "NO", the following Medicare IM given date fields will be blank) Date Medicare IM given:   Date Additional Medicare IM given:    Discharge Disposition:  SKILLED NURSING FACILITY  Per UR Regulation:  Reviewed for med. necessity/level of care/duration of stay  If discussed at Long Length of Stay Meetings, dates discussed:    Comments:  10/25/12 [name] [name] Dec,RN,BSN [phone] PT DISCHARGED TO SNF TODAY, PER CSW ARRANGEMENTS .

## 2012-10-20 NOTE — Progress Notes (Signed)
[facility] PROGRESS NOTE  [name] [name] ZOX:[id number] DOB: 1929/01/14 DOA: 10/19/2012 PCP: Gretel Acre, MD  Assessment/Plan: 1-right foot metatarsal fracture: secondary to mechanical injury. Per ortho plan is for conservative management and outpatient follow up. Cast for 2-3 months.  2-Multiple falls: so far workup is essentially negative. Will check vit D level and if abnormal will replete.-ask and follow recommendations from Pt/OT  3-initially presumed CAP: but repeat x-ray of her lungs is clear and patient no complaining of any URI symptoms. Will discontinue antibiotics.  4-Swimming feeling in her head: will check MRI and ask PT to evaluate for possible needs of vestibular rehab.  5-HTN: BP stable continue current regimen.  6-depression: continue effexor  WJX:[id number]  Code Status: DNR Family Communication: Son at bedside Disposition Plan: will follow PT/OT evaluation; patient might require SNF for conditionin   Consultants: PT/OT Procedures:  See below for x-ray reports  Antibiotics:  levaquin x 2 days.  HPI/Subjective: Afebrile, no cough; denies CP or SOB.  Objective: Filed Vitals:   10/19/12 2315 10/19/12 2338 10/20/12 0152 10/20/12 0409  BP: 140/81   121/64  Pulse: 91   85  Temp: 97.8 F (36.6 C)   97.5 F (36.4 C)  TempSrc: Oral   Oral  Resp: 18   18  Height:  5\' 6"  (1.676 m)    Weight:  66.6 kg (146 lb 13.2 oz)    SpO2: 99%  100% 98%    Intake/Output Summary (Last 24 hours) at 10/20/12 1400 Last data filed at 10/20/12 1215  Gross per 24 hour  Intake    240 ml  Output    200 ml  Net     40 ml   Filed Weights   10/19/12 2338  Weight: 66.6 kg (146 lb 13.2 oz)    Exam:   General:  NAD; afebrile. Move four extremities on request; AAOX2  Cardiovascular: S2 and S2; no rubs o rgallops  Respiratory: CTA  Abdomen: soft, NT, ND; denies N/V  Extremities: RLE with bandage pre-cats in place; no cyanosis. Trace edema  Data  Reviewed: Basic Metabolic Panel:  Lab 10/20/12 5621 10/19/12 1847  NA 140 139  K 3.4* 3.9  CL 102 102  CO2 24 25  GLUCOSE 111* 100*  BUN 14 16  CREATININE 0.91 0.92  CALCIUM 8.7 9.1  MG -- --  PHOS -- --   Liver Function Tests:  Lab 10/19/12 1847  AST 20  ALT 10  ALKPHOS 113  BILITOT 0.2*  PROT 7.2  ALBUMIN 3.5   CBC:  Lab 10/20/12 0455 10/19/12 1847  WBC 9.1 11.0*  NEUTROABS -- 7.9*  HGB 12.5 14.1  HCT 37.3 42.3  MCV 90.1 91.8  PLT 325 334   Cardiac Enzymes:  Lab 10/20/12 1127 10/20/12 0455 10/20/12 0010 10/19/12 1848  CKTOTAL -- -- -- --  CKMB -- -- -- --  CKMBINDEX -- -- -- --  TROPONINI <0.30 <0.30 <0.30 <0.30   CBG:  Lab 10/20/12 1138 10/20/12 0600  GLUCAP 99 115*   Studies: Dg Chest 2 View  10/20/2012  *RADIOLOGY REPORT*  Clinical Data: Cough, pneumonia.  CHEST - 2 VIEW  Comparison: October 19, 2012.  Findings: Cardiomediastinal silhouette appears normal.  No acute pulmonary disease is noted.  Bony thorax is intact.  Hiatal hernia is unchanged.  Right lower lobe opacity noted on prior exam has resolved.  IMPRESSION: Previously described right lower lobe opacity has resolved.  No acute cardiopulmonary abnormality seen currently.   Original Report Authenticated By:  Lupita Raider.,  M.D.    Ct Head Wo Contrast  10/19/2012  *RADIOLOGY REPORT*  Clinical Data: Multiple falls with bruising to the right face.  CT HEAD WITHOUT CONTRAST  Technique:  Contiguous axial images were obtained from the base of the skull through the vertex without contrast.  Comparison: CT head 01/19/2008.  Findings: There is no evidence for acute infarction, intracranial hemorrhage, mass lesion, hydrocephalus, or extra-axial fluid.  Mild to moderate atrophy has progressed from 2009.  There is chronic microvascular ischemic change in the periventricular and subcortical white matter.  Carotid atherosclerosis is prominently noted in the siphon regions.  There is no visible skull fracture or  sinus air fluid level.  Negative orbits.  IMPRESSION: Progressive atrophy and chronic microvascular ischemic change.  No skull fracture or intracranial hemorrhage.   Original Report Authenticated By: Davonna Belling, M.D.    Dg Abd Acute W/chest  10/19/2012  *RADIOLOGY REPORT*  Clinical Data: 79 year old female with constipation.  ACUTE ABDOMEN SERIES (ABDOMEN 2 VIEW & CHEST 1 VIEW)  Comparison: 08/06/2012 and prior radiographs. 08/27/2012 abdominal CT.  Findings: The cardiomediastinal silhouette is unremarkable. COPD/emphysema is identified.  An ill-defined focal opacity overlying the right lower lung is identified and could represent a small focal area of airspace disease/pneumonia or overlapping structures.  There is no abnormality identified on the recent CT. There is no evidence of pleural effusion or pneumothorax.  The bowel gas pattern is unremarkable. There is no evidence of bowel obstruction or pneumoperitoneum. No suspicious calcifications are identified. Degenerative changes of the lumbar spine are noted.  IMPRESSION: Unremarkable bowel gas pattern.  Ill-defined focal opacity overlying the right lower lung possibly representing focal pneumonia.   Original Report Authenticated By: Harmon Pier, M.D.     Scheduled Meds:   . amLODipine  5 mg Oral Daily  . B-complex with vitamin C  1 tablet Oral Daily  . enoxaparin (LOVENOX) injection  40 mg Subcutaneous Q24H  . potassium chloride  40 mEq Oral Q4H  . sodium chloride  3 mL Intravenous Q12H  . sodium chloride  3 mL Intravenous Q12H  . venlafaxine XR  75-150 mg Oral BID   Continuous Infusions:   Active Problems:  Multiple falls  Community acquired pneumonia  Displaced fracture of fifth metatarsal bone    Time spent: >30    [name] [name]  [facility] Pager [phone]. If 8PM-8AM, please contact night-coverage at [email or web address], password [facility] 10/20/2012, 2:00 PM  LOS: 1 day

## 2012-10-20 NOTE — Progress Notes (Signed)
Pharmacy consult - Antibiotic Renal Dose Adjustment  83 YOF on levaquin 750 mg IV Q 24 hrs for CAP, est. crcl = 45 ml/min, afebrile, wbc wnl, Chest X-ray shows previously described right lower lobe opacity has resolved. First dose levaquin was given last night at ~2130  Plan:  Will adjust levaquin dose to 750mg  Q 48 hrs base on renal function and switch to PO Other medication reviewed, and dose appropriate for renal function Pharmacy sign off, please re-consult if other antibiotics dosing is needed.  Bayard Hugger, PharmD, BCPS  Clinical Pharmacist  Pager: [phone]

## 2012-10-20 NOTE — Progress Notes (Signed)
Patient increasingly confused. No neuro deficits noted. " I don't want to be around strangers. " Patient gets upset and  anxious when so leaves the room. Attempts to get out of bed. Reoriented to time and place. MRI complete. Dr. Gwenlyn Perking notified via Amion; orders received for sitter. Mamie Levers

## 2012-10-20 NOTE — Progress Notes (Signed)
INITIAL NUTRITION ASSESSMENT  DOCUMENTATION CODES Per approved criteria  -Non-severe (moderate) malnutrition in the context of acute illness or injury   INTERVENTION:  Ensure Complete twice daily (350 kcals, 13 gm protein per 8 fl oz bottle) RD to follow for nutrition care plan  NUTRITION DIAGNOSIS: Inadequate oral intake related to poor appetite as evidenced by son report  Goal: Oral intake with meals & supplements to meet >/= 90% of estimated nutrition needs  Monitor:  PO & supplemental intake, weight, labs, I/O's  Reason for Assessment: Malnutrition Screening Tool Report  79 y.o. female  Admitting Dx: Right foot pain  ASSESSMENT: Patient admitted to ER from her doctor's office to be evaluated for increased falls; reports a "swimming feeling" in her head, and decreased hearing.  RD spoke with patient's son at bedside; reports his Mom's appetite has been very poor and she's been eating "like a bird;" also states she's lost approximately 10 lbs in < 2 months (6%); amenable to RD ordering supplements during hospitalization.  Patient meets criteria for non-severe (moderate) malnutrition in the context of acute illness or injury given < 75% intake of estimated energy requirement for > 7 days and 6% weight loss in < 2 months.  Height: Ht Readings from Last 1 Encounters:  10/19/12 5\' 6"  (1.676 m)    Weight: Wt Readings from Last 1 Encounters:  10/19/12 146 lb 13.2 oz (66.6 kg)    Ideal Body Weight: 130 lb  % Ideal Body Weight: 112%  Wt Readings from Last 10 Encounters:  10/19/12 146 lb 13.2 oz (66.6 kg)  09/18/11 135 lb (61.236 kg)    Usual Body Weight: ---  % Usual Body Weight: ---  BMI:  Body mass index is 23.70 kg/(m^2).  Estimated Nutritional Needs: Kcal: 1700-1900 Protein: 80-90 gm Fluid: 1.7-1.9 L  Skin: bruising over R foot  Diet Order: Cardiac  EDUCATION NEEDS: -No education needs identified at this time   Intake/Output Summary (Last 24 hours)  at 10/20/12 1358 Last data filed at 10/20/12 1215  Gross per 24 hour  Intake    240 ml  Output    200 ml  Net     40 ml    Last BM: 1/27  Labs:   Lab 10/20/12 0455 10/19/12 1847  NA 140 139  K 3.4* 3.9  CL 102 102  CO2 24 25  BUN 14 16  CREATININE 0.91 0.92  CALCIUM 8.7 9.1  MG -- --  PHOS -- --  GLUCOSE 111* 100*    CBG (last 3)   Basename 10/20/12 1138 10/20/12 0600  GLUCAP 99 115*    Scheduled Meds:   . amLODipine  5 mg Oral Daily  . B-complex with vitamin C  1 tablet Oral Daily  . enoxaparin (LOVENOX) injection  40 mg Subcutaneous Q24H  . potassium chloride  40 mEq Oral Q4H  . sodium chloride  3 mL Intravenous Q12H  . sodium chloride  3 mL Intravenous Q12H  . venlafaxine XR  75-150 mg Oral BID    Continuous Infusions:   Past Medical History  Diagnosis Date  . Hypertension   . Arthritis   . Hiatal hernia     GERD  . Iron deficiency anemia   . DJD (degenerative joint disease)     Hands  . Depression     Past Surgical History  Procedure Date  . Orif radial fracture 09/2007    Maureen Chatters, RD, LDN Pager #: [phone] After-Hours Pager #: [phone]

## 2012-10-20 NOTE — Progress Notes (Signed)
OT cancellation Note

## 2012-10-20 NOTE — Progress Notes (Signed)
PT Cancellation Note  Patient Details Name: Shalunda Lindh MRN: [id number] DOB: 03/01/1929   Cancelled Treatment:    Reason Eval/Treat Not Completed: Patient at procedure or test/unavailable (pt going down to MRI again)   Janna Oak, [place] 10/20/2012, 4:15 PM

## 2012-10-20 NOTE — Consult Note (Signed)
ORTHOPAEDIC CONSULTATION  REQUESTING PHYSICIAN: [name] Curie, MD  Chief Complaint: Right foot pain  HPI: [name] [name] is a 79 y.o. female who complains of  right foot pain. It is not exactly clear when or how this happened, as patient says she does not remember what happened. She reports moderate to severe foot pain over the lateral aspect of the right foot, worse with weightbearing. At rest it is fairly minimal. She denies a history of osteoporosis. She denies any other associated injuries. No numbness or tingling distally in the foot.   Past Medical History  Diagnosis Date  . Hypertension   . Arthritis   . Hiatal hernia     GERD  . Iron deficiency anemia   . DJD (degenerative joint disease)     Hands  . Depression    Past Surgical History  Procedure Date  . Orif radial fracture 09/2007   History   Social History  . Marital Status: Widowed    Spouse Name: N/A    Number of Children: N/A  . Years of Education: N/A   Social History Main Topics  . Smoking status: Former Smoker -- 1.0 packs/day  . Smokeless tobacco: None  . Alcohol Use: No  . Drug Use: No  . Sexually Active: None   Other Topics Concern  . 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  Lives independently and able to get around without a cane or walker, still quite healthy and active. Has two sisters who are involved. One son, two grandchildren. Her son currently has a brain tumor, which is causing the patient stress. [name] [name], sister, [phone]. She quit smoking and drinking about 20 yrs ago.    Family History  Problem Relation Age of Onset  . Diabetes Father   . Stroke Father   . Atrial fibrillation Mother    Allergies  Allergen Reactions  . Sulfa Antibiotics Itching   Prior to Admission medications   Medication Sig Start Date End Date Taking? Authorizing Provider  amLODipine (NORVASC) 5 MG tablet Take 5 mg by mouth daily.     Yes Historical  Provider, MD  b complex vitamins tablet Take 1 tablet by mouth daily.     Yes Historical Provider, MD  bisacodyl (DULCOLAX) 5 MG EC tablet Take 5 mg by mouth daily as needed.   Yes Historical Provider, MD  cholecalciferol (VITAMIN D) 1000 UNITS tablet Take 1,000 Units by mouth daily.   Yes Historical Provider, MD  FeFum-FePo-FA-B Cmp-C-Zn-Mn-Cu (SE-TAN PLUS) 162-115.2-1 MG CAPS Take 1 capsule by mouth daily.    Yes Historical Provider, MD  loratadine (CLARITIN) 10 MG tablet Take 10 mg by mouth daily as needed. For allergies   Yes Historical Provider, MD  sodium phosphate (FLEET) enema Place 1 enema rectally once. follow package directions   Yes Historical Provider, MD  traMADol (ULTRAM) 50 MG tablet Take 50 mg by mouth 2 (two) times daily as needed. For pain   Yes Historical Provider, MD  venlafaxine (EFFEXOR-XR) 75 MG 24 hr capsule Take 75-150 mg by mouth 2 (two) times daily. Takes 2 capsule every morning for 150mg  dosage and takes 1 capsule at night for 75mg  dosage   Yes Historical Provider, MD   Ct Head Wo Contrast  10/19/2012  *RADIOLOGY REPORT*  Clinical Data: Multiple falls with bruising to the right face.  CT HEAD WITHOUT CONTRAST  Technique:  Contiguous axial images were obtained from the base of the  skull through the vertex without contrast.  Comparison: CT head 01/19/2008.  Findings: There is no evidence for acute infarction, intracranial hemorrhage, mass lesion, hydrocephalus, or extra-axial fluid.  Mild to moderate atrophy has progressed from 2009.  There is chronic microvascular ischemic change in the periventricular and subcortical white matter.  Carotid atherosclerosis is prominently noted in the siphon regions.  There is no visible skull fracture or sinus air fluid level.  Negative orbits.  IMPRESSION: Progressive atrophy and chronic microvascular ischemic change.  No skull fracture or intracranial hemorrhage.   Original Report Authenticated By: Davonna Belling, M.D.    Dg Abd Acute  W/chest  10/19/2012  *RADIOLOGY REPORT*  Clinical Data: 79 year old female with constipation.  ACUTE ABDOMEN SERIES (ABDOMEN 2 VIEW & CHEST 1 VIEW)  Comparison: 08/06/2012 and prior radiographs. 08/27/2012 abdominal CT.  Findings: The cardiomediastinal silhouette is unremarkable. COPD/emphysema is identified.  An ill-defined focal opacity overlying the right lower lung is identified and could represent a small focal area of airspace disease/pneumonia or overlapping structures.  There is no abnormality identified on the recent CT. There is no evidence of pleural effusion or pneumothorax.  The bowel gas pattern is unremarkable. There is no evidence of bowel obstruction or pneumoperitoneum. No suspicious calcifications are identified. Degenerative changes of the lumbar spine are noted.  IMPRESSION: Unremarkable bowel gas pattern.  Ill-defined focal opacity overlying the right lower lung possibly representing focal pneumonia.   Original Report Authenticated By: Harmon Pier, M.D.     Positive ROS: All other systems have been reviewed and were otherwise negative with the exception of those mentioned in the HPI and as above.  Physical Exam: General: Alert, no acute distress, sitting up in bed Cardiovascular: No pedal edema, with the exception of some soft tissue swelling over the lateral border of the right foot Respiratory: She is currently receiving a breathing treatment, and has mild increased use of accessory musculature GI: No organomegaly, abdomen is soft and non-tender Skin: No lesions in the area of chief complaint, except for bruising over the foot Neurologic: Sensation intact distally Psychiatric: Patient is competent for consent with normal mood and affect Lymphatic: No axillary or cervical lymphadenopathy  MUSCULOSKELETAL: Right foot has positive pain to palpation over the lateral border. EHL and FHL are firing.  Assessment: Right fifth metatarsal shaft fracture with some displacement in an  79 year old woman  Plan: This is an acute injury, which is going to take at least 2-3 months to heal from. I discussed the options with her including surgical versus nonsurgical intervention. Given her age, and functional capacity, I recommended nonsurgical intervention, although there are risks for nonunion, as well as malunion, however the surgical risks are also fairly significant. This is particularly relevant given her coexisting comorbidities.  I have recommended a short-leg cast, well-padded, with a hard sole shoe, in order to allow for weightbearing, and hopefully accelerate and optimize independence and function.  I will plan to see her in my office in approximately 2 weeks. We will plan for closed treatment of the metatarsal fracture.    Wasyl Dornfeld P, MD Cell [phone] Pager [phone]  10/20/2012 7:52 AM

## 2012-10-20 NOTE — Progress Notes (Signed)
Before the fall happened,pt has intermittent confusion,she is aware of self time and date but not aware of safety,pt son just left the room to get some food .The bed 3 rails  is up,bed alarm is on,on camera room.Bed alarm went on,I  and other staff responded right away,Pt is on the floor in sitting position,abrassion in left forearm with small amount of bleeding  noted,and a small cut in the back of the head.Vital signs was checked,there is no significant change in orientation and vital signs of the patient after the fall,pt put back in bed and additional assessment per protocol were followed. K Schoor NP,hospitalist on call was notified and said to checked the pt.Pt son came in and explain how the incident was happened.Never event safety huddle at the bedside with the pt son was done.Will continue to monitor vital signs/management per protocol. Orion Vandervort Jamey Reas RN

## 2012-10-21 ENCOUNTER — Encounter (HOSPITAL_COMMUNITY): Payer: Self-pay | Admitting: Neurological Surgery

## 2012-10-21 DIAGNOSIS — I6200 Nontraumatic subdural hemorrhage, unspecified: Secondary | ICD-10-CM

## 2012-10-21 DIAGNOSIS — F0390 Unspecified dementia without behavioral disturbance: Secondary | ICD-10-CM

## 2012-10-21 LAB — URINE CULTURE: Colony Count: 40000

## 2012-10-21 LAB — BASIC METABOLIC PANEL
BUN: 10 mg/dL (ref 6–23)
CO2: 26 mEq/L (ref 19–32)
Calcium: 8.9 mg/dL (ref 8.4–10.5)
Chloride: 106 mEq/L (ref 96–112)
Creatinine, Ser: 0.86 mg/dL (ref 0.50–1.10)
GFR calc Af Amer: 71 mL/min — ABNORMAL LOW (ref >90)
GFR calc non Af Amer: 62 mL/min — ABNORMAL LOW (ref >90)
Glucose, Bld: 111 mg/dL — ABNORMAL HIGH (ref 70–99)
Potassium: 4.5 mEq/L (ref 3.5–5.1)
Sodium: 141 mEq/L (ref 135–145)

## 2012-10-21 LAB — CBC
HCT: 39.2 % (ref 36.0–46.0)
Hemoglobin: 13 g/dL (ref 12.0–15.0)
MCH: 30.4 pg (ref 26.0–34.0)
MCHC: 33.2 g/dL (ref 30.0–36.0)
MCV: 91.8 fL (ref 78.0–100.0)
Platelets: 335 10*3/uL (ref 150–400)
RBC: 4.27 MIL/uL (ref 3.87–5.11)
RDW: 13.2 % (ref 11.5–15.5)
WBC: 10.4 10*3/uL (ref 4.0–10.5)

## 2012-10-21 LAB — MRSA PCR SCREENING: MRSA by PCR: NEGATIVE

## 2012-10-21 MED ORDER — SODIUM CHLORIDE 0.9 % IV SOLN: 250.0000 mL | INTRAVENOUS | Status: DC | PRN

## 2012-10-21 MED ORDER — BACITRACIN ZINC 500 UNIT/GM EX OINT
TOPICAL_OINTMENT | Freq: Two times a day (BID) | CUTANEOUS | Status: DC
  Administered 2012-10-21 – 2012-10-25 (×9): via TOPICAL
  Filled 2012-10-21 (×3): qty 15

## 2012-10-21 MED ORDER — DONEPEZIL HCL 5 MG PO TABS
5.0000 mg | ORAL_TABLET | Freq: Every day | ORAL | Status: DC
  Administered 2012-10-21 – 2012-10-24 (×4): 5 mg via ORAL
  Filled 2012-10-21 (×6): qty 1

## 2012-10-21 NOTE — Progress Notes (Signed)
[facility] PROGRESS NOTE  Brennan Karam ZOX:[id number] DOB: 1928/11/05 DOA: 10/19/2012 PCP: Gretel Acre, MD  Assessment/Plan: 1-right foot metatarsal fracture: secondary to mechanical injury. Per ortho plan is for conservative management and outpatient follow up. Cast for 2-3 months.  2-Multiple falls: so far workup is essentially negative. Will check vit D level and if abnormal will replete. Will follow recommendations from PT/OT; might need SNF.  3-initially presumed CAP: but repeat x-ray of her lungs is clear and patient no complaining of any URI symptoms. Abx's has been discontinued; patient remains asymptomatic.  4-Swimming feeling in her head: MRI not showing any acute abnormalities; will ask PT to evaluate for possible needs of vestibular rehab.  5-HTN: BP stable continue current regimen.  6-depression: continue effexor; dose reduced to 75mg  BID  7-Left SDH: small and without needs for neurosurgical intervention at this moment. Will monitor in step down unit for close neuro checks; repeat CT head tomorrow. Will discontinue lovenox; start SCD's  8-Intermittent confusion: secondary to dementia; at this point will start low dose aricept. PT to evaluate patient; needs supportive care and close follow up as an outpatient.  DVT:SCD's  Code Status: DNR Family Communication: Son at bedside Disposition Plan: will follow PT/OT evaluation; patient might require SNF for conditionin   Consultants: PT/OT Neurosurgery  Procedures:  See below for x-ray reports  Antibiotics:  levaquin x 2 days.  HPI/Subjective: No headaches, no fever, no acute complaints. Overnight fall and hit her head; CT done at that moment demonstrated Left side small SDH.  Objective: Filed Vitals:   10/21/12 0400 10/21/12 0500 10/21/12 0600 10/21/12 0700  BP: 136/102 114/64 96/65 137/66  Pulse: 102 97 100 99  Temp: 97.4 F (36.3 C)     TempSrc:      Resp: 24 26 20 22   Height:      Weight:       SpO2: 96% 95% 95% 94%    Intake/Output Summary (Last 24 hours) at 10/21/12 0816 Last data filed at 10/21/12 0600  Gross per 24 hour  Intake    360 ml  Output   1550 ml  Net  -1190 ml   Filed Weights   10/19/12 2338  Weight: 66.6 kg (146 lb 13.2 oz)    Exam:   General:  NAD; afebrile. Move four extremities on request; AAOX2  Cardiovascular: S2 and S2; no rubs o rgallops  Respiratory: CTA  Abdomen: soft, NT, ND; denies N/V  Extremities: RLE with bandage pre-cats in place; no cyanosis. Trace edema  Data Reviewed: Basic Metabolic Panel:  Lab 10/20/12 8119 10/19/12 1847  NA 140 139  K 3.4* 3.9  CL 102 102  CO2 24 25  GLUCOSE 111* 100*  BUN 14 16  CREATININE 0.91 0.92  CALCIUM 8.7 9.1  MG -- --  PHOS -- --   Liver Function Tests:  Lab 10/19/12 1847  AST 20  ALT 10  ALKPHOS 113  BILITOT 0.2*  PROT 7.2  ALBUMIN 3.5   CBC:  Lab 10/20/12 0455 10/19/12 1847  WBC 9.1 11.0*  NEUTROABS -- 7.9*  HGB 12.5 14.1  HCT 37.3 42.3  MCV 90.1 91.8  PLT 325 334   Cardiac Enzymes:  Lab 10/20/12 1127 10/20/12 0455 10/20/12 0010 10/19/12 1848  CKTOTAL -- -- -- --  CKMB -- -- -- --  CKMBINDEX -- -- -- --  TROPONINI <0.30 <0.30 <0.30 <0.30   CBG:  Lab 10/20/12 2103 10/20/12 1614 10/20/12 1138 10/20/12 0600  GLUCAP 90 134* 99 115*  Studies: Dg Chest 2 View  10/20/2012  *RADIOLOGY REPORT*  Clinical Data: Cough, pneumonia.  CHEST - 2 VIEW  Comparison: October 19, 2012.  Findings: Cardiomediastinal silhouette appears normal.  No acute pulmonary disease is noted.  Bony thorax is intact.  Hiatal hernia is unchanged.  Right lower lobe opacity noted on prior exam has resolved.  IMPRESSION: Previously described right lower lobe opacity has resolved.  No acute cardiopulmonary abnormality seen currently.   Original Report Authenticated By: Lupita Raider.,  M.D.    Ct Head Wo Contrast  10/21/2012  *RADIOLOGY REPORT*  Clinical Data:  79 year old female with fall, headache  and neck pain.  CT HEAD WITHOUT CONTRAST CT CERVICAL SPINE WITHOUT CONTRAST  Technique:  Multidetector CT imaging of the head and cervical spine was performed following the standard protocol without intravenous contrast.  Multiplanar CT image reconstructions of the cervical spine were also generated.  Comparison:  10/19/2012  CT HEAD  Findings: A new left frontotemporoparietal subdural hematoma is noted with a maximal diameter of 5 mm.  4 mm left to right midline shift is noted. Atrophy and chronic small vessel white matter ischemic changes are noted. There is no evidence of intraparenchymal hemorrhage, acute infarction, or mass lesion. No acute or suspicious bony abnormalities are identified.  IMPRESSION: New left frontotemporoparietal subdural hematoma with maximal diameter of 5 mm with 4 mm left to right midline shift.  Atrophy and chronic small vessel white matter ischemic changes.  CT CERVICAL SPINE  Findings: Normal alignment is noted. There is no evidence of acute fracture, subluxation or prevertebral soft tissue swelling. Moderate to severe degenerative disc disease and spondylosis from C4-C7 identified. Multilevel facet arthropathy is identified. The soft tissue structures are within normal limits.  IMPRESSION: No static evidence of acute injury to the cervical spine.  Degenerative changes as described above.  Critical Value/emergent results were called by telephone at the time of interpretation on 10/21/2012 at 2:00 a.m. to [facility], who verbally acknowledged these results.   Original Report Authenticated By: Harmon Pier, M.D.    Ct Head Wo Contrast  10/19/2012  *RADIOLOGY REPORT*  Clinical Data: Multiple falls with bruising to the right face.  CT HEAD WITHOUT CONTRAST  Technique:  Contiguous axial images were obtained from the base of the skull through the vertex without contrast.  Comparison: CT head 01/19/2008.  Findings: There is no evidence for acute infarction, intracranial hemorrhage, mass  lesion, hydrocephalus, or extra-axial fluid.  Mild to moderate atrophy has progressed from 2009.  There is chronic microvascular ischemic change in the periventricular and subcortical white matter.  Carotid atherosclerosis is prominently noted in the siphon regions.  There is no visible skull fracture or sinus air fluid level.  Negative orbits.  IMPRESSION: Progressive atrophy and chronic microvascular ischemic change.  No skull fracture or intracranial hemorrhage.   Original Report Authenticated By: Davonna Belling, M.D.    Ct Cervical Spine Wo Contrast  10/21/2012  *RADIOLOGY REPORT*  Clinical Data:  79 year old female with fall, headache and neck pain.  CT HEAD WITHOUT CONTRAST CT CERVICAL SPINE WITHOUT CONTRAST  Technique:  Multidetector CT imaging of the head and cervical spine was performed following the standard protocol without intravenous contrast.  Multiplanar CT image reconstructions of the cervical spine were also generated.  Comparison:  10/19/2012  CT HEAD  Findings: A new left frontotemporoparietal subdural hematoma is noted with a maximal diameter of 5 mm.  4 mm left to right midline shift is noted. Atrophy and chronic small  vessel white matter ischemic changes are noted. There is no evidence of intraparenchymal hemorrhage, acute infarction, or mass lesion. No acute or suspicious bony abnormalities are identified.  IMPRESSION: New left frontotemporoparietal subdural hematoma with maximal diameter of 5 mm with 4 mm left to right midline shift.  Atrophy and chronic small vessel white matter ischemic changes.  CT CERVICAL SPINE  Findings: Normal alignment is noted. There is no evidence of acute fracture, subluxation or prevertebral soft tissue swelling. Moderate to severe degenerative disc disease and spondylosis from C4-C7 identified. Multilevel facet arthropathy is identified. The soft tissue structures are within normal limits.  IMPRESSION: No static evidence of acute injury to the cervical spine.   Degenerative changes as described above.  Critical Value/emergent results were called by telephone at the time of interpretation on 10/21/2012 at 2:00 a.m. to [facility], who verbally acknowledged these results.   Original Report Authenticated By: Harmon Pier, M.D.    Mr Laqueta Jean Wo Contrast  10/20/2012  *RADIOLOGY REPORT*  Clinical Data: Worsening balance with falls.  Dizziness.  MRI HEAD WITHOUT AND WITH CONTRAST  Technique:  Multiplanar, multiecho pulse sequences of the brain and surrounding structures were obtained according to standard protocol without and with intravenous contrast  Contrast: 14mL MULTIHANCE GADOBENATE DIMEGLUMINE 529 MG/ML IV SOLN  Comparison: Head CT 10/19/2012  Findings: Diffusion imaging does not show any acute or subacute infarction.  The brainstem is unremarkable.  There are too small lacunar infarctions in the inferior right cerebellum.  The cerebral hemispheres show age related atrophy with moderate chronic small vessel change within the deep white matter.  There is ex vacuo enlargement of the lateral ventricles.  No suspicion of obstructive hydrocephalus.  No mass lesion, hemorrhage, hydrocephalus or subdural hematoma.  The patient does have been the subdural hygromas, 6 mm maximal thickness on the left and 2 mm maximal thickness on the right.    No pituitary mass.  No fluid in the sinuses, middle ears or mastoids.  No skull or skull base lesion.  After contrast administration, there is mild dural enhancement. This is not infrequently seen and can be due to any number of old insults. Because of the small subdural hygromas, this is probably secondary to that, and may indicate that the patient had subdural hematomas in the distant past.  IMPRESSION: No cause of the patient's clinical change is identified.  Age related atrophy and chronic small vessel disease of the hemispheric white matter. Small subdural hygromas bilaterally, not compressive.  The patient does have mild dural  enhancement.  Usually, this is not clinically significant.  This can relate to old insults such as old head trauma or old subdurals.  Because this patient has a small low density subdural hygromas, the dural enhancement is probably related to that process.   Original Report Authenticated By: Paulina Fusi, M.D.    Dg Abd Acute W/chest  10/19/2012  *RADIOLOGY REPORT*  Clinical Data: 79 year old female with constipation.  ACUTE ABDOMEN SERIES (ABDOMEN 2 VIEW & CHEST 1 VIEW)  Comparison: 08/06/2012 and prior radiographs. 08/27/2012 abdominal CT.  Findings: The cardiomediastinal silhouette is unremarkable. COPD/emphysema is identified.  An ill-defined focal opacity overlying the right lower lung is identified and could represent a small focal area of airspace disease/pneumonia or overlapping structures.  There is no abnormality identified on the recent CT. There is no evidence of pleural effusion or pneumothorax.  The bowel gas pattern is unremarkable. There is no evidence of bowel obstruction or pneumoperitoneum. No suspicious calcifications are  identified. Degenerative changes of the lumbar spine are noted.  IMPRESSION: Unremarkable bowel gas pattern.  Ill-defined focal opacity overlying the right lower lung possibly representing focal pneumonia.   Original Report Authenticated By: Harmon Pier, M.D.     Scheduled Meds:    . amLODipine  5 mg Oral Daily  . B-complex with vitamin C  1 tablet Oral Daily  . bacitracin   Topical BID  . feeding supplement  237 mL Oral BID BM  . sodium chloride  3 mL Intravenous Q12H  . sodium chloride  3 mL Intravenous Q12H  . venlafaxine XR  75 mg Oral BID   Continuous Infusions:   Active Problems:  Multiple falls  Community acquired pneumonia  Displaced fracture of fifth metatarsal bone    Time spent: >30    Sherena Machorro  [facility] Pager [phone]. If 8PM-8AM, please contact night-coverage at [email or web address], password [facility] 10/21/2012, 8:16 AM  LOS: 2 days

## 2012-10-21 NOTE — Progress Notes (Signed)
Patient ID: [name] [name], female   DOB: 12/01/1928, 79 y.o.   MRN: [id number] Looks really good this morning. She is wide awake and denies headache. She moves her extremities to command with equal strength. Doing well. Will follow.

## 2012-10-21 NOTE — Progress Notes (Signed)
Event: 2100:  Notified by RN that pt had a fall in the room at bedside. She was awake and alert when son found her in floor. LOC and mechanism of fall  unknown.  RN notes an abrasion to the back of her head and left FA. VSS. NP to bedside. Subjective: Pt is unable to give eaxct details of the fall given mild baseline dememtia but does remember falling and is able to tell me that she has had numerous falls recently. Denies that she had CP or SOB but was unable to say if she felt dizzy prior to fall. Admits ahe did hit her head on the wall next to her bed. Son states he had just stepped out of the room to get food and was only gone a couple of minutes when he returned and found his mother sitting in the floor. Pt states she is still "sore" in several places from her previous falls but denies andy ne areas of pain.  Objective: Suriya Kovarik is a 79 y.o. female, with history of hypertension and arthritis, brought to the emergency room on 10/19/2012 from her doctor's office to be evaluated for increased falls. During her most recent fall she injured her (R) foot. Prior to hospitalization pt lived alone w/ help from her son. At bedside pt is very pleasant and animated. She smiles and relates her problems w/ recent falls. She is alert and orient to person and able to follow commands. PEARRL. Pt mae's x 4 though movement of RLE limited due to recent illness and splint. Grips equal bil. CN I-XII appear grossly intact. There is no chest wall TTP. Pelvis stable. No bil hip pain or TTP. No bony TTP to L-spine or T-spine. Mild TTP to C-spin. Superficial abrasions noted to (L) occipital area scalp and (L) lateral FA w/o active bleeding. Large approx 3 to 4 cm hematoma to (L) occipital area. Face is atraumatic. BBS CTA. Abd is soft, nt, non-distended w/ normal bs. Current VSS, BP- 148/82, T-98.4, P-98, R-20 w/ 02 sats of 98% on R/A. Ct head and cervical spine w/o contrast were obtained and revealed a small 5mm left  frontotemporoparietal SDH w/ 4mm (R) midline shift which is new from head Ct from 10/19/2012. Ct cervical spine w/o acute findings. Assessment/Plan: 1. SDH: s/p unwitnessed fall in room. H/o increased falls recently. No focal neurological deficits (did not stand pt). Discussed pt w/ Dr Yetta Barre with neurosurgery service who has agreed to see pt tonight. I have discussed findings of head Ct w/ son who is aware of plan and is agreeable. Will move pt to SDU for closer observation.  2. Abrasions: General wound care. Dressing to (R) FA. I discussed pt w/ Dr Onalee Hua who agrees w/ plan.  Leanne Chang, NP-C [facility] Pager [phone]

## 2012-10-21 NOTE — Progress Notes (Signed)
Physical Therapy Evaluation Patient Details Name: [name] [name] MRN: [id number] DOB: December 04, 1928 Today's Date: 10/21/2012 Time: 4098-1191 PT Time Calculation (min): 20 min  PT Assessment / Plan / Recommendation Clinical Impression  79 yo female admitted s/p fall with 5 falls in 2 weeks. Pt with fall since admission and now with Lt SDH.  Pt will benefit from acute PT services to improve overall mobility to prepare for safe d/c to next venue.    PT Assessment  Patient needs continued PT services    Follow Up Recommendations  SNF    Barriers to Discharge Decreased caregiver support      Equipment Recommendations  Rolling walker with 5" wheels    Frequency Min 3X/week    Precautions / Restrictions Precautions Precautions: Fall Required Braces or Orthoses: Other Brace/Splint (Rt darco post op shoe for foot fx) Restrictions Weight Bearing Restrictions: Yes RLE Weight Bearing: Weight bearing as tolerated Other Position/Activity Restrictions: must have darco show on with cast   Pertinent Vitals/Pain No c/o pain      Mobility  Bed Mobility Bed Mobility: Supine to Sit;Sitting - Scoot to Edge of Bed Supine to Sit: 4: Min guard;HOB flat Sitting - Scoot to [place] of Bed: 4: Min assist Details for Bed Mobility Assistance: Pt with verbal cues and min tactile cues. Pt progressing to Left sid e of the bed. Transfers Transfers: Sit to Stand;Stand to Sit Sit to Stand: 3: Mod assist;With upper extremity assist;From bed Stand to Sit: 3: Mod assist;With upper extremity assist;To chair/3-in-1 Details for Transfer Assistance: pt with mod v/c for safety and to descend to chair safety Ambulation/Gait Ambulation/Gait Assistance: 3: Mod assist Ambulation Distance (Feet): 20 Feet Assistive device: 1 person hand held assist Ambulation/Gait Assistance Details: (A) to maintain balance and cues for proper step sequence. Gait Pattern: Step-to pattern;Decreased stride length Gait velocity:  decrease Stairs: No     PT Diagnosis: Difficulty walking;Generalized weakness;Abnormality of gait  PT Problem List: Decreased strength;Decreased activity tolerance;Decreased balance;Decreased mobility;Decreased cognition;Decreased knowledge of use of DME;Decreased knowledge of precautions PT Treatment Interventions: DME instruction;Gait training;Functional mobility training;Therapeutic activities;Therapeutic exercise;Balance training;Neuromuscular re-education;Patient/family education   PT Goals Acute Rehab PT Goals PT Goal Formulation: Patient unable to participate in goal setting Time For Goal Achievement: 11/04/12 Potential to Achieve Goals: Good Pt will go Supine/Side to Sit: with supervision PT Goal: Supine/Side to Sit - Progress: Goal set today Pt will go Sit to Supine/Side: with supervision PT Goal: Sit to Supine/Side - Progress: Goal set today Pt will go Sit to Stand: with supervision PT Goal: Sit to Stand - Progress: Goal set today Pt will go Stand to Sit: with supervision PT Goal: Stand to Sit - Progress: Goal set today Pt will Ambulate: >150 feet;with supervision;with rolling walker PT Goal: Ambulate - Progress: Goal set today  Visit Information  Last PT Received On: 10/21/12 Assistance Needed: +1 PT/OT Co-Evaluation/Treatment: Yes    Subjective Data  Subjective: "[place]. [place] guess." Patient Stated Goal: Unable to set   Prior Functioning  Home Living Lives With: Alone Additional Comments: pt is poor historian and currently reporting falling in a barn due to getting into another family members way. Pt 's son wishes per SW Brayton Caves to place patient in SNF [facility] Communication Communication: No difficulties Dominant Hand: Right    Cognition  Overall Cognitive Status: Impaired Area of Impairment: Following commands;Memory;Problem solving;Awareness of deficits;Awareness of errors Arousal/Alertness: Awake/alert Orientation Level: Disoriented  to;Place;Time;Situation Behavior During Session: [facility] for tasks performed Memory Deficits:  poor recall of age, pt reports having a grandson and a son. Pt states son is 20 and in second year at [employer]. Pt asked personal age and with delayed response states 82. Ot provided questioning cues " you are 75 and your son is 20 is that right?" Pt states "yes thats correct" Ot provided stronger cues "if you son is 20 and you are 27 , did you have him when you were 79 years old? " Pt statse "yes Yes i did". NO awareness of error Following Commands: Follows one step commands consistently;Follows one step commands with increased time Awareness of Errors: Assistance required to identify errors made Awareness of Errors - Other Comments: see above memory section Problem Solving: Pt unaware partial denture not in mouth and needed visual cue / auditory cue from therapist to don partial on bottom    Extremity/Trunk Assessment Right Upper Extremity Assessment RUE ROM/Strength/Tone: Within functional levels RUE Sensation: WFL - Light Touch RUE Coordination: WFL - gross motor Left Upper Extremity Assessment LUE ROM/Strength/Tone: WFL for tasks assessed LUE Sensation: WFL - Light Touch LUE Coordination: WFL - gross motor Right Lower Extremity Assessment RLE ROM/Strength/Tone: Within functional levels RLE Sensation: WFL - Light Touch RLE Coordination: WFL - gross/fine motor Left Lower Extremity Assessment LLE ROM/Strength/Tone: Within functional levels LLE Sensation: WFL - Light Touch LLE Coordination: WFL - gross/fine motor Trunk Assessment Trunk Assessment: Normal   Balance Balance Balance Assessed: Yes Static Standing Balance Static Standing - Balance Support: Bilateral upper extremity supported;During functional activity Static Standing - Level of Assistance: 4: Min assist Static Standing - Comment/# of Minutes: 2  End of Session PT - End of Session Equipment Utilized During Treatment: Gait belt (post op  shoe) Activity Tolerance: Patient tolerated treatment well Patient left: in chair;with call bell/phone within reach Nurse Communication: Mobility status  GP     Ambre Kobayashi 10/21/2012, 11:26 AM Jake Shark, PT DPT [phone]

## 2012-10-21 NOTE — Consult Note (Signed)
Reason for Consult:SDH Referring Physician: Triad [name] [name] is an 79 y.o. female.   HPI:  79 year old female who was reportedly DO NOT RESUSCITATE who fell during the hospital and struck her head. Unknown loss of consciousness. Patient has a history of dementia. She denies headache or numbness tingling or weakness at this point. Head CT showed a left-sided subdural hematoma and neurosurgical consultation was requested. No progressive symptoms. No seizure.  Past Medical History  Diagnosis Date  . Hypertension   . Arthritis   . Hiatal hernia     GERD  . Iron deficiency anemia   . DJD (degenerative joint disease)     Hands  . Depression     Past Surgical History  Procedure Date  . Orif radial fracture 09/2007    Allergies  Allergen Reactions  . Sulfa Antibiotics Itching    History  Substance Use Topics  . Smoking status: Former Smoker -- 1.0 packs/day  . Smokeless tobacco: Not on file  . Alcohol Use: No    Family History  Problem Relation Age of Onset  . Diabetes Father   . Stroke Father   . Atrial fibrillation Mother      Review of Systems  Positive ROS: Negative  All other systems have been reviewed and were otherwise negative with the exception of those mentioned in the HPI and as above.  Objective: Vital signs in last 24 hours: Temp:  [97.5 F (36.4 C)-98.4 F (36.9 C)] 98 F (36.7 C) (01/29 2356) Pulse Rate:  [85-112] 98  (01/30 0100) Resp:  [18-20] 18  (01/30 0100) BP: (121-170)/(64-86) 134/74 mmHg (01/30 0100) SpO2:  [97 %-99 %] 98 % (01/30 0100)  General Appearance: Alert, cooperative, no distress, appears stated age Head: Normocephalic, without obvious abnormality, small swelling to the left frontal region Eyes: PERRL, conjunctiva/corneas clear, EOM's intact, gaze is conjugate        NEUROLOGIC:   Mental status: Alert, no aphasia, good attention span, poor short term memory Motor Exam - grossly normal, normal tone and bulk Sensory  Exam - grossly normal Reflexes: symmetric, no pathologic reflexes Coordination - grossly normal Gait - not tested Balance -not tested Cranial Nerves: I: smell Not tested  II: visual acuity  OS: na   OD: na  II: visual fields  not tested   II: pupils Equal, round, reactive to light  III,VII: ptosis None  III,IV,VI: extraocular muscles  Full ROM  V: mastication Normal  V: facial light touch sensation  Normal  V,VII: corneal reflex  Present  VII: facial muscle function - upper  Normal  VII: facial muscle function - lower Normal  VIII: hearing Not tested  IX: soft palate elevation  Normal  IX,X: gag reflex Present  XI: trapezius strength  5/5  XI: sternocleidomastoid strength 5/5  XI: neck flexion strength  5/5  XII: tongue strength  Normal    Data Review Lab Results  Component Value Date   WBC 9.1 10/20/2012   HGB 12.5 10/20/2012   HCT 37.3 10/20/2012   MCV 90.1 10/20/2012   PLT 325 10/20/2012   Lab Results  Component Value Date   NA 140 10/20/2012   K 3.4* 10/20/2012   CL 102 10/20/2012   CO2 24 10/20/2012   BUN 14 10/20/2012   CREATININE 0.91 10/20/2012   GLUCOSE 111* 10/20/2012   Lab Results  Component Value Date   INR 0.91 10/19/2012    Radiology: Dg Chest 2 View  10/20/2012  *RADIOLOGY REPORT*  Clinical  Data: Cough, pneumonia.  CHEST - 2 VIEW  Comparison: October 19, 2012.  Findings: Cardiomediastinal silhouette appears normal.  No acute pulmonary disease is noted.  Bony thorax is intact.  Hiatal hernia is unchanged.  Right lower lobe opacity noted on prior exam has resolved.  IMPRESSION: Previously described right lower lobe opacity has resolved.  No acute cardiopulmonary abnormality seen currently.   Original Report Authenticated By: Lupita Raider.,  M.D.    Ct Head Wo Contrast  10/19/2012  *RADIOLOGY REPORT*  Clinical Data: Multiple falls with bruising to the right face.  CT HEAD WITHOUT CONTRAST  Technique:  Contiguous axial images were obtained from the base of the  skull through the vertex without contrast.  Comparison: CT head 01/19/2008.  Findings: There is no evidence for acute infarction, intracranial hemorrhage, mass lesion, hydrocephalus, or extra-axial fluid.  Mild to moderate atrophy has progressed from 2009.  There is chronic microvascular ischemic change in the periventricular and subcortical white matter.  Carotid atherosclerosis is prominently noted in the siphon regions.  There is no visible skull fracture or sinus air fluid level.  Negative orbits.  IMPRESSION: Progressive atrophy and chronic microvascular ischemic change.  No skull fracture or intracranial hemorrhage.   Original Report Authenticated By: Davonna Belling, M.D.    Mr Laqueta Jean Wo Contrast  10/20/2012  *RADIOLOGY REPORT*  Clinical Data: Worsening balance with falls.  Dizziness.  MRI HEAD WITHOUT AND WITH CONTRAST  Technique:  Multiplanar, multiecho pulse sequences of the brain and surrounding structures were obtained according to standard protocol without and with intravenous contrast  Contrast: 14mL MULTIHANCE GADOBENATE DIMEGLUMINE 529 MG/ML IV SOLN  Comparison: Head CT 10/19/2012  Findings: Diffusion imaging does not show any acute or subacute infarction.  The brainstem is unremarkable.  There are too small lacunar infarctions in the inferior right cerebellum.  The cerebral hemispheres show age related atrophy with moderate chronic small vessel change within the deep white matter.  There is ex vacuo enlargement of the lateral ventricles.  No suspicion of obstructive hydrocephalus.  No mass lesion, hemorrhage, hydrocephalus or subdural hematoma.  The patient does have been the subdural hygromas, 6 mm maximal thickness on the left and 2 mm maximal thickness on the right.    No pituitary mass.  No fluid in the sinuses, middle ears or mastoids.  No skull or skull base lesion.  After contrast administration, there is mild dural enhancement. This is not infrequently seen and can be due to any number of  old insults. Because of the small subdural hygromas, this is probably secondary to that, and may indicate that the patient had subdural hematomas in the distant past.  IMPRESSION: No cause of the patient's clinical change is identified.  Age related atrophy and chronic small vessel disease of the hemispheric white matter. Small subdural hygromas bilaterally, not compressive.  The patient does have mild dural enhancement.  Usually, this is not clinically significant.  This can relate to old insults such as old head trauma or old subdurals.  Because this patient has a small low density subdural hygromas, the dural enhancement is probably related to that process.   Original Report Authenticated By: Paulina Fusi, M.D.    Dg Abd Acute W/chest  10/19/2012  *RADIOLOGY REPORT*  Clinical Data: 79 year old female with constipation.  ACUTE ABDOMEN SERIES (ABDOMEN 2 VIEW & CHEST 1 VIEW)  Comparison: 08/06/2012 and prior radiographs. 08/27/2012 abdominal CT.  Findings: The cardiomediastinal silhouette is unremarkable. COPD/emphysema is identified.  An ill-defined focal opacity  overlying the right lower lung is identified and could represent a small focal area of airspace disease/pneumonia or overlapping structures.  There is no abnormality identified on the recent CT. There is no evidence of pleural effusion or pneumothorax.  The bowel gas pattern is unremarkable. There is no evidence of bowel obstruction or pneumoperitoneum. No suspicious calcifications are identified. Degenerative changes of the lumbar spine are noted.  IMPRESSION: Unremarkable bowel gas pattern.  Ill-defined focal opacity overlying the right lower lung possibly representing focal pneumonia.   Original Report Authenticated By: Harmon Pier, M.D.    CT scan: She has a small left acute subdural hematoma with minimal mass effect or shift. Measures about 5 mm. Chronic atrophy  Assessment/Plan: Small left acute subdural hematoma after fall. There is no need for  acute neurosurgical intervention. Would get followup head CT in about 24 hours or if she has a change in neurologic status. Discussed this with her son, and explained the risk of hemorrhage enlarging (small risk), versus resolution of the subdural hematoma or progression into chronic subdural hematoma. She is being transferred to a step down unit. She looks good clinically at this point.   Angelique Chevalier S 10/21/2012 2:51 AM

## 2012-10-21 NOTE — Clinical Social Work Placement (Addendum)
    Clinical Social Work Department CLINICAL SOCIAL WORK PLACEMENT NOTE 10/21/2012  Patient:  [name] [name], [name] [name]  Account Number:  [id number] Admit date:  10/19/2012  Clinical Social Worker:  Macario Golds, LCSW  Date/time:  10/21/2012 11:00 AM  Clinical Social Work is seeking post-discharge placement for this patient at the following level of care:   SKILLED NURSING   (*CSW will update this form in Epic as items are completed)   10/21/2012  Patient/family provided with [facility] Department of Clinical Social Work's list of facilities offering this level of care within the geographic area requested by the patient (or if unable, by the patient's family).  10/21/2012  Patient/family informed of their freedom to choose among providers that offer the needed level of care, that participate in Medicare, Medicaid or managed care program needed by the patient, have an available bed and are willing to accept the patient.  10/21/2012  Patient/family informed of [facility]' ownership interest in [facility], as well as of the fact that they are under no obligation to receive care at this facility.  PASARR submitted to EDS on 10/21/2012 PASARR number received from EDS on 10/22/12   FL2 transmitted to all facilities in geographic area requested by pt/family on  10/21/2012 FL2 transmitted to all facilities within larger geographic area on   Patient informed that his/her managed care company has contracts with or will negotiate with  certain facilities, including the following:     Patient/family informed of bed offers received:  10/22/2012 Patient chooses bed at  Physician recommends and patient chooses bed at    Patient to be transferred to  on   Patient to be transferred to facility by   The following physician request were entered in Epic:   Additional Comments: 01/31 - Patient son would like to be close to [facility]  1/31 - Bed offers given son would like placement  at [facility]. CSW awaiting to hear if [facility] can offer placement.

## 2012-10-21 NOTE — Clinical Social Work Psychosocial (Signed)
     Clinical Social Work Department BRIEF PSYCHOSOCIAL ASSESSMENT 10/21/2012  Patient:  [name] [name]     Account Number:  [id number]     Admit date:  10/19/2012  Clinical Social Worker:  Verl Blalock  Date/Time:  10/21/2012 11:00 AM  Referred by:  CSW  Date Referred:  10/19/2012 Referred for  SNF Placement   Other Referral:   Patient was on 2000 where initial CSW referral was placed, then fell while on the unit and transferred to 3100 01/30. This CSW received handoff on 01/30   Interview type:  Family Other interview type:   Patient with extensive confusion - spoke with patient son, [name] [name], over the phone.    PSYCHOSOCIAL DATA Living Status:  ALONE Admitted from facility:   Level of care:   Primary support name:  [name] [name]  [phone] Primary support relationship to patient:  CHILD, ADULT Degree of support available:   Strong    CURRENT CONCERNS Current Concerns  Post-Acute Placement   Other Concerns:    SOCIAL WORK ASSESSMENT / PLAN Clinical Social Worker spoke with patient son over the phone to offer support surrounding patient hospital fall last evening and to discuss patient plans at discharge. Patient son states that patient has continued to live alone and not been compliant with medications, with 5 falls in the last 2 weeks.  Patient son adamantly does not want patient to return home following this hospitalization. Patient has had progressive confusion for the last 8 months and patient son fears patient safety at home alone.    Clinical Social Worker provided patient son with options based on therapy recommendations for patient and SNF placement.  Patient son is in full agreement with intiating a search in [place] at this time.  Patient son states that he has been in communication with [facility] and is hopeful for placement at their ALF once patient is able to complete a rehab stay.  Patient and patient son have already communicated wishes  to [facility] and plan to now aggressively continue to communicate their plans for long term goal.  CSW will initiate SNF search in [facility] and follow up with patient son regarding bed offers.  CSW remains available for emotional support and to facilitate patient discharge needs once medically stable.   Assessment/plan status:  Psychosocial Support/Ongoing Assessment of Needs Other assessment/ plan:   Information/referral to community resources:   Visual merchandiser provided patient son with facility list in the room and CSW contact information.    PATIENTS/FAMILYS RESPONSE TO PLAN OF CARE: Patient alert and oriented to self only sitting in the chair eating her breakfast.  Patient confused on place and time stating that she is in school and questioning what grade the nursing staff is in.  Patient son is very agreeable with discharge to SNF once patient medically ready.  Patient son comments "this is long overdue." Patient does not like to wander but does present as a fall risk due to her feeling of "urgent need" to go to the bathroom.  RN aware and has been watching carefully. Patient son verbalized his appreciation for CSW support and concern, but did verbalize his own concerns regarding patient fall while hospitalized.  RN staff from unit 2000 to continue communication per patient son.  CSW will assist as needed.

## 2012-10-21 NOTE — Progress Notes (Signed)
Pt went for CT of head and neck accompanied with nurse per order.

## 2012-10-21 NOTE — Progress Notes (Signed)
Received patient from 3100. Patient placed on monitor and VVS; bed alarm ON and leaf placed on door for fall risk.  Patient and family oriented to unit and routines.  Will continue to monitor.

## 2012-10-21 NOTE — Progress Notes (Signed)
UR completed 

## 2012-10-21 NOTE — Evaluation (Signed)
Occupational Therapy Evaluation Patient Details Name: [name] [name] MRN: [id number] DOB: October 31, 1928 Today's Date: 10/21/2012 Time: 0102-7253 OT Time Calculation (min): 21 min  OT Assessment / Plan / Recommendation Clinical Impression  79 yo female admitted s/p fall with 5 falls in 2 weeks. Pt with fall since admission and now with Lt SDH. Ot to follow acutely. Recommend SNF for d/c planning. pt is high fall risk.     OT Assessment  Patient needs continued OT Services    Follow Up Recommendations  SNF    Barriers to Discharge      Equipment Recommendations  3 in 1 bedside comode    Recommendations for Other Services    Frequency  Min 2X/week    Precautions / Restrictions Precautions Precautions: Fall Required Braces or Orthoses: Other Brace/Splint (Rt darco post op shoe for foot fx) Restrictions Weight Bearing Restrictions: Yes RLE Weight Bearing: Weight bearing as tolerated Other Position/Activity Restrictions: must have darco show on with cast   Pertinent Vitals/Pain     ADL  Eating/Feeding: Set up Where Assessed - Eating/Feeding: Chair (unable to open containers) Grooming: Wash/dry hands;Teeth care;Moderate assistance;Minimal assistance Where Assessed - Grooming: Supported standing Upper Body Dressing: Moderate assistance Where Assessed - Upper Body Dressing: Supported sitting Toilet Transfer: Moderate assistance Toilet Transfer Method: Sit to stand Toilet Transfer Equipment: Raised toilet seat with arms (or 3-in-1 over toilet) Toileting - Clothing Manipulation and Hygiene: Maximal assistance Equipment Used: Gait belt Transfers/Ambulation Related to ADLs: Pt requires hand held (A) Mod (A) and redirection to task. Pt easily distracted. pt is high fall risk. pt leaning to the left and coudl benefit from tennis shoe so that ambulation is even with darco shoe on Rt foot. ADL Comments: Pt is high fall risk and poor historian. Pt pleasant and eager to participate. Pt  completed bed mobility, toilet transfer, sink level grooming , don partial denture, eating in chair and basic transfer.    OT Diagnosis: Cognitive deficits;Generalized weakness  OT Problem List: Decreased strength;Decreased activity tolerance;Impaired balance (sitting and/or standing);Decreased cognition;Decreased safety awareness;Decreased knowledge of use of DME or AE OT Treatment Interventions: Self-care/ADL training;DME and/or AE instruction;Therapeutic activities;Cognitive remediation/compensation;Patient/family education;Balance training   OT Goals Acute Rehab OT Goals OT Goal Formulation: Patient unable to participate in goal setting Time For Goal Achievement: 11/04/12 Potential to Achieve Goals: Good ADL Goals Pt Will Perform Grooming: with set-up;Sitting, chair;Supported ADL Goal: Grooming - Progress: Goal set today Pt Will Transfer to Toilet: Ambulation;3-in-1;with supervision ADL Goal: Statistician - Progress: Goal set today Miscellaneous OT Goals Miscellaneous OT Goal #1: Pt will complete static standing for 5 minutes with grooming task with Supervision (A) OT Goal: Miscellaneous Goal #1 - Progress: Goal set today  Visit Information  Last OT Received On: 10/21/12 Assistance Needed: +1 PT/OT Co-Evaluation/Treatment: Yes    Subjective Data  Subjective: "I havent grown up yet 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 Pt 's son wishes per SW Brayton Caves to place [facility] Communication Communication: No difficulties Dominant Hand: Right  Vision/Perception     Cognition  Overall Cognitive Status: Impaired Area of Impairment: Following commands;Memory;Problem solving;Awareness of deficits;Awareness of errors Arousal/Alertness: Awake/alert Orientation Level: Disoriented to;Place;Time;Situation Behavior During Session: [facility] for tasks performed Memory Deficits: poor recall of age, pt reports having a grandson and a son. Pt states son is 20 and in second year at [employer]. Pt asked personal age and with delayed response states 82. Ot provided questioning cues " you are 41 and your son is 20 is that right?" Pt states "yes thats correct" Ot provided stronger cues "if you son is 20 and you are 15 , did you have him when you were 79 years old? " Pt statse "yes Yes i did". NO awareness of error Following Commands: Follows one step commands consistently;Follows one step commands with increased time Awareness of Errors: Assistance required to identify errors made Awareness of Errors - Other Comments: see above memory section Problem Solving: Pt unaware partial denture not in mouth and needed visual cue / auditory cue from therapist to don partial on bottom    Extremity/Trunk Assessment Right Upper Extremity Assessment RUE ROM/Strength/Tone: Within functional levels RUE Sensation: WFL - Light Touch RUE Coordination: WFL - gross motor Left Upper Extremity Assessment LUE ROM/Strength/Tone: WFL for tasks assessed LUE Sensation: WFL - Light Touch LUE Coordination: WFL - gross motor Trunk Assessment Trunk Assessment: Normal     Mobility Bed Mobility Bed Mobility: Supine to Sit;Sitting - Scoot to Edge of Bed Supine to Sit: 4: Min guard;HOB flat Sitting - Scoot to [place] of Bed: 4: Min assist Details for Bed Mobility Assistance: Pt with verbal cues and min tactile cues. Pt progressing to Left sid e of the bed. Transfers Transfers: Sit to Stand;Stand to Sit Sit to Stand: 3: Mod assist;With upper extremity assist;From bed Stand to Sit: 3: Mod  assist;With upper extremity assist;To chair/3-in-1 Details for Transfer Assistance: pt with mod v/c for safety and to descend to chair safety     Shoulder Instructions     Exercise     Balance Balance Balance Assessed: Yes Static Standing Balance Static Standing - Balance Support: Bilateral upper extremity supported;During functional activity Static Standing - Level of Assistance: 4: Min assist Static Standing - Comment/# of Minutes: 2   End of Session OT - End of Session Activity Tolerance: Patient tolerated treatment well Patient left: in chair;with call bell/phone within reach Nurse Communication: Mobility status;Precautions  GO     [name] [name] 10/21/2012, 11:15 AM Pager: [phone]

## 2012-10-22 ENCOUNTER — Inpatient Hospital Stay (HOSPITAL_COMMUNITY): Payer: Medicare Other

## 2012-10-22 DIAGNOSIS — F0390 Unspecified dementia without behavioral disturbance: Secondary | ICD-10-CM | POA: Diagnosis present

## 2012-10-22 NOTE — Progress Notes (Signed)
Clinical Social Worker (CSW) contacted pt son and informed him that [facility] is able to offer placement (bed available on Monday). Pt son very pleased and appreciative. Unit CSW will begin following and will facilitate with dc to [facility] when pt is medically ready.  Theresia Bough, MSW, Theresia Majors [phone]

## 2012-10-22 NOTE — Progress Notes (Signed)
CSW provided pt son Loraine Leriche with bed offers. Pt son prefers placement at [facility], [facility] or [facility]. CSW has left a message for [facility] Pt son plans to visit facilities over the weekend and contact CSW with a decision. CSW to continue following for placement.  Theresia Bough, MSW, Theresia Majors [phone]

## 2012-10-22 NOTE — Clinical Documentation Improvement (Signed)
MALNUTRITION DOCUMENTATION CLARIFICATION  THIS DOCUMENT IS NOT A PERMANENT PART OF THE MEDICAL RECORD  TO RESPOND TO THE THIS QUERY, FOLLOW THE INSTRUCTIONS BELOW:  1. If needed, update documentation for the patient's encounter via the notes activity.  2. Access this query again and click edit on the In [employer].  3. After updating, or not, click F2 to complete all highlighted (required) fields concerning your review. Select "additional documentation in the medical record" OR "no additional documentation provided".  4. Click Sign note button.  5. The deficiency will fall out of your In Basket *Please let us know if you are not able to complete this workflow by phone or e-mail (listed below).  Please update your documentation within the medical record to reflect your response to this query.                                                                                        10/22/12   Dear Dr. Gwenlyn Perking / Associates,  In a better effort to capture your patient's severity of illness, reflect appropriate length of stay and utilization of resources, a review of the patient medical record has revealed the following indicators.    Based on your clinical judgment, please clarify and document in a progress note and/or discharge summary the clinical condition associated with the following supporting information:  In responding to this query please exercise your independent judgment.  The fact that a query is asked, does not imply that any particular answer is desired or expected.   Possible Clinical Conditions?  ____Moderate Malnutrition    __x__Moderate Protein Calorie Malnutrition    ____Other Condition  ____Cannot clinically determine     Risk Factors:  Signs & Symptoms: Ht: 5'6      Wt: 146lb,13.2oz   BMI:  23.70   Treatment: 01/29: Ensure Complete po twice daily.  Nutrition Consult: 10/20/12: Meets criteria for Moderate Malnutrition in the context of acute illness as  evidenced by inadequate oral intake related to poor appetite. Has lost approximately 10 pounds in less than 2 months(6%).   You may use possible, probable, or suspect with inpatient documentation. possible, probable, suspected diagnoses MUST be documented at the time of discharge  Reviewed:moderate protein calorie malnutrition will be added to progress note and discharge summary; thanks....  Thank You,  Marciano Sequin,  Clinical Documentation Specialist:  Pager: [phone]  Phone: [phone]  Health Information Management [place]

## 2012-10-22 NOTE — Progress Notes (Addendum)
Patient being transferred to unit 2000 per MD order. Gave report to Raynelle Fanning, Char[employer] fundraiser, will transfer in wheel chair. Called and spoke with son, Loraine Leriche) gave him patient new room number and updates .

## 2012-10-22 NOTE — Progress Notes (Addendum)
[facility] PROGRESS NOTE  [name] [name] ZOX:[id number] DOB: [birth year] DOA: 10/19/2012 PCP: Gretel Acre, MD  Assessment/Plan: 1-right foot metatarsal fracture: secondary to mechanical injury. Per ortho; plan is for conservative management and outpatient follow up. Cast for 2-3 months.  2-Multiple falls: so far workup is essentially negative. Will follow vit D level and if abnormal will replete/supplement. Will follow recommendations from PT/OT, Needs SNF. Family agreeable with plan.   3-initially presumed CAP: but repeat x-ray of her lungs is clear and patient no complaining of any URI symptoms. Abx's has been discontinued; patient remains asymptomatic.  4-Swimming feeling in her head: MRI not showing any acute abnormalities; no nystagmus and no further sensation of dizziness. PT recommends SNF for rehabiliation and conditioning. 5-HTN: BP stable continue current regimen. (amlodipine)  6-depression: continue effexor; dose reduced to 75mg  BID; plan is to treat at this dose for 1 week and then reduce to 75mg  daily.  7-Left SDH: small and without needs for neurosurgical intervention at this moment.CT repeated this morning demonstrating resolution of SDH and no midline shift.  8-Dementia with Intermittent confusion/delirium: secondary to underlying dementia and change in environment. Will continue arcept. PT has recommended SNF. SW aware of situation and helping with placement.   9-Moderate protein calorie malnutrition: due to acute illness and decrease appetite; will encourage PO intake and continue feeding supplements as recommended by dietician  DVT:SCD's  Code Status: DNR Family Communication: Son at bedside Disposition Plan: will need SNFat discharge   Consultants: PT/OT Neurosurgery  Procedures:  See below for x-ray reports  Antibiotics:  levaquin x 2 days.  HPI/Subjective: No headaches, no fever, no acute complaints. Neurologic exam intact; repeated CT;  demonstrating resolution of SDH, no midline shifting  Objective: Filed Vitals:   10/22/12 0000 10/22/12 0400 10/22/12 0747 10/22/12 1044  BP: 136/80 120/66 117/67 124/67  Pulse: 88 86 86   Temp: 98.4 F (36.9 C) 97.4 F (36.3 C) 98.2 F (36.8 C)   TempSrc: Oral Oral Oral   Resp: 22 24    Height:      Weight:      SpO2: 92% 98%      Intake/Output Summary (Last 24 hours) at 10/22/12 1204 Last data filed at 10/22/12 1047  Gross per 24 hour  Intake    483 ml  Output    525 ml  Net    -42 ml   Filed Weights   10/19/12 2338  Weight: 66.6 kg (146 lb 13.2 oz)    Exam:   General:  NAD; afebrile. Move four extremities on request; AAOX2 (per family close to baseline mentally)  Cardiovascular: S2 and S2; no rubs o rgallops  Respiratory: CTA  Abdomen: soft, NT, ND; denies N/V  Extremities: RLE with bandage pre-cats in place; no cyanosis. Trace edema  Data Reviewed: Basic Metabolic Panel:  Lab 10/21/12 8119 10/20/12 0455 10/19/12 1847  NA 141 140 139  K 4.5 3.4* 3.9  CL 106 102 102  CO2 26 24 25   GLUCOSE 111* 111* 100*  BUN 10 14 16   CREATININE 0.86 0.91 0.92  CALCIUM 8.9 8.7 9.1  MG -- -- --  PHOS -- -- --   Liver Function Tests:  Lab 10/19/12 1847  AST 20  ALT 10  ALKPHOS 113  BILITOT 0.2*  PROT 7.2  ALBUMIN 3.5   CBC:  Lab 10/21/12 0906 10/20/12 0455 10/19/12 1847  WBC 10.4 9.1 11.0*  NEUTROABS -- -- 7.9*  HGB 13.0 12.5 14.1  HCT 39.2 37.3 42.3  MCV 91.8 90.1 91.8  PLT 335 325 334   Cardiac Enzymes:  Lab 10/20/12 1127 10/20/12 0455 10/20/12 0010 10/19/12 1848  CKTOTAL -- -- -- --  CKMB -- -- -- --  CKMBINDEX -- -- -- --  TROPONINI <0.30 <0.30 <0.30 <0.30   CBG:  Lab 10/20/12 2103 10/20/12 1614 10/20/12 1138 10/20/12 0600  GLUCAP 90 134* 99 115*   Studies: Ct Head Wo Contrast  10/22/2012  *RADIOLOGY REPORT*  Clinical Data: Subdural hematoma.  CT HEAD WITHOUT CONTRAST  Technique:  Contiguous axial images were obtained from the base of  the skull through the vertex without contrast.  Comparison: CT head without contrast 10/21/2011. MRI of the brain 10/20/2012.  Findings: The acute blood products are less conspicuous on today's exam than previously seen.  The acute blood products are stable to slightly less conspicuous than previously seen.  There is a gradient anteriorly suggesting some breakdown.  No new hemorrhage is present.  Midline shift now measures 3.5 mm, decreased.  The patient's chronic subdural hygroma on the left remains.  Mild generalized atrophy white matter disease is stable.  No acute cortical infarct or mass lesion is present.  The ventricles are proportionate to the degree of atrophy.  The paranasal sinuses and mastoid air cells are clear.  The osseous skull is intact.  Atherosclerotic changes are present within the cavernous carotid arteries bilaterally.  IMPRESSION:  1.  Stable to slight decrease in conspicuity of the acute left subdural hematoma. 2.  Slight decrease in midline shift. 3.  Stable chronic left subdural hygroma. 4.  Stable atrophy and white matter disease.   Original Report Authenticated By: Marin Roberts, M.D.    Ct Head Wo Contrast  10/21/2012  *RADIOLOGY REPORT*  Clinical Data:  79 year old female with fall, headache and neck pain.  CT HEAD WITHOUT CONTRAST CT CERVICAL SPINE WITHOUT CONTRAST  Technique:  Multidetector CT imaging of the head and cervical spine was performed following the standard protocol without intravenous contrast.  Multiplanar CT image reconstructions of the cervical spine were also generated.  Comparison:  10/19/2012  CT HEAD  Findings: A new left frontotemporoparietal subdural hematoma is noted with a maximal diameter of 5 mm.  4 mm left to right midline shift is noted. Atrophy and chronic small vessel white matter ischemic changes are noted. There is no evidence of intraparenchymal hemorrhage, acute infarction, or mass lesion. No acute or suspicious bony abnormalities are  identified.  IMPRESSION: New left frontotemporoparietal subdural hematoma with maximal diameter of 5 mm with 4 mm left to right midline shift.  Atrophy and chronic small vessel white matter ischemic changes.  CT CERVICAL SPINE  Findings: Normal alignment is noted. There is no evidence of acute fracture, subluxation or prevertebral soft tissue swelling. Moderate to severe degenerative disc disease and spondylosis from C4-C7 identified. Multilevel facet arthropathy is identified. The soft tissue structures are within normal limits.  IMPRESSION: No static evidence of acute injury to the cervical spine.  Degenerative changes as described above.  Critical Value/emergent results were called by telephone at the time of interpretation on 10/21/2012 at 2:00 a.m. to [facility], who verbally acknowledged these results.   Original Report Authenticated By: Harmon Pier, M.D.    Ct Cervical Spine Wo Contrast  10/21/2012  *RADIOLOGY REPORT*  Clinical Data:  79 year old female with fall, headache and neck pain.  CT HEAD WITHOUT CONTRAST CT CERVICAL SPINE WITHOUT CONTRAST  Technique:  Multidetector CT imaging of the head and cervical spine was performed following  the standard protocol without intravenous contrast.  Multiplanar CT image reconstructions of the cervical spine were also generated.  Comparison:  10/19/2012  CT HEAD  Findings: A new left frontotemporoparietal subdural hematoma is noted with a maximal diameter of 5 mm.  4 mm left to right midline shift is noted. Atrophy and chronic small vessel white matter ischemic changes are noted. There is no evidence of intraparenchymal hemorrhage, acute infarction, or mass lesion. No acute or suspicious bony abnormalities are identified.  IMPRESSION: New left frontotemporoparietal subdural hematoma with maximal diameter of 5 mm with 4 mm left to right midline shift.  Atrophy and chronic small vessel white matter ischemic changes.  CT CERVICAL SPINE  Findings: Normal alignment  is noted. There is no evidence of acute fracture, subluxation or prevertebral soft tissue swelling. Moderate to severe degenerative disc disease and spondylosis from C4-C7 identified. Multilevel facet arthropathy is identified. The soft tissue structures are within normal limits.  IMPRESSION: No static evidence of acute injury to the cervical spine.  Degenerative changes as described above.  Critical Value/emergent results were called by telephone at the time of interpretation on 10/21/2012 at 2:00 a.m. to [facility], who verbally acknowledged these results.   Original Report Authenticated By: Harmon Pier, M.D.    Mr Laqueta Jean Wo Contrast  10/20/2012  *RADIOLOGY REPORT*  Clinical Data: Worsening balance with falls.  Dizziness.  MRI HEAD WITHOUT AND WITH CONTRAST  Technique:  Multiplanar, multiecho pulse sequences of the brain and surrounding structures were obtained according to standard protocol without and with intravenous contrast  Contrast: 14mL MULTIHANCE GADOBENATE DIMEGLUMINE 529 MG/ML IV SOLN  Comparison: Head CT 10/19/2012  Findings: Diffusion imaging does not show any acute or subacute infarction.  The brainstem is unremarkable.  There are too small lacunar infarctions in the inferior right cerebellum.  The cerebral hemispheres show age related atrophy with moderate chronic small vessel change within the deep white matter.  There is ex vacuo enlargement of the lateral ventricles.  No suspicion of obstructive hydrocephalus.  No mass lesion, hemorrhage, hydrocephalus or subdural hematoma.  The patient does have been the subdural hygromas, 6 mm maximal thickness on the left and 2 mm maximal thickness on the right.    No pituitary mass.  No fluid in the sinuses, middle ears or mastoids.  No skull or skull base lesion.  After contrast administration, there is mild dural enhancement. This is not infrequently seen and can be due to any number of old insults. Because of the small subdural hygromas, this is  probably secondary to that, and may indicate that the patient had subdural hematomas in the distant past.  IMPRESSION: No cause of the patient's clinical change is identified.  Age related atrophy and chronic small vessel disease of the hemispheric white matter. Small subdural hygromas bilaterally, not compressive.  The patient does have mild dural enhancement.  Usually, this is not clinically significant.  This can relate to old insults such as old head trauma or old subdurals.  Because this patient has a small low density subdural hygromas, the dural enhancement is probably related to that process.   Original Report Authenticated By: Paulina Fusi, M.D.     Scheduled Meds:    . amLODipine  5 mg Oral Daily  . B-complex with vitamin C  1 tablet Oral Daily  . bacitracin   Topical BID  . donepezil  5 mg Oral QHS  . feeding supplement  237 mL Oral BID BM  . sodium chloride  3 mL Intravenous Q12H  . sodium chloride  3 mL Intravenous Q12H  . venlafaxine XR  75 mg Oral BID   Continuous Infusions:   Active Problems:  Multiple falls  Community acquired pneumonia  Displaced fracture of fifth metatarsal bone  Dementia    Time spent: >30    [facility]  [facility] Pager [phone]. If 8PM-8AM, please contact night-coverage at [email or web address], password [email or web address] 10/22/2012, 12:04 PM  LOS: 3 days

## 2012-10-23 NOTE — Progress Notes (Signed)
[facility] PROGRESS NOTE  [name] [name] ZOX:[id number] DOB: 1928-11-29 DOA: 10/19/2012 PCP: Gretel Acre, MD  Assessment/Plan: 1-right foot metatarsal fracture: secondary to mechanical injury. Per ortho; plan is for conservative management and outpatient follow up. Cast for 2-3 months. Hard cast to be placed once swelling almost resolved.  2-Multiple falls: so far workup is essentially negative. Will follow vit D level and if abnormal will replete/supplement. Will follow recommendations from PT/OT, Needs SNF. Family agreeable with plan.   3-initially presumed CAP: but repeat x-ray of her lungs is clear and patient no complaining of any URI symptoms. Abx's has been discontinued; patient remains asymptomatic.  4-Swimming feeling in her head: MRI not showing any acute abnormalities; no nystagmus and no further sensation of dizziness. PT recommends SNF for rehabiliation and conditioning. 5-HTN: BP stable continue current regimen. (amlodipine)  6-Depression: continue effexor; dose reduced to 75mg  BID; plan is to treat at this dose for 1 week and then reduce to 75mg  daily.  7-Left SDH: small and without needs for neurosurgical intervention at this moment.CT repeated this morning demonstrating resolution of SDH and no midline shift.  8-Dementia with Intermittent confusion/delirium: secondary to underlying dementia and change in environment. Will continue arcept. PT has recommended SNF. SW aware of situation and helping with placement.   9-Moderate protein calorie malnutrition: due to acute illness and decrease appetite; will encourage PO intake and continue feeding supplements as recommended by dietician  DVT:SCD's  Code Status: DNR Family Communication: Son at bedside Disposition Plan: will need SNFat discharge; plan is for the patient to go to [facility] on Monday 10/25/12   Consultants: PT/OT Neurosurgery  Procedures:  See below for x-ray reports  Antibiotics:  levaquin x 2  days.  HPI/Subjective: No headaches, no fever, no acute complaints. Neurologic exam intact. Patient reports right lower extremity pain he stated well controlled; she is alert, awake and oriented 3 with a pretty good perception of ongoing situation.  Objective: Filed Vitals:   10/22/12 1044 10/22/12 1130 10/22/12 1510 10/22/12 2014  BP: 124/67 124/67 140/87 143/77  Pulse:  88 102 99  Temp:  97.5 F (36.4 C) 98.4 F (36.9 C) 98.5 F (36.9 C)  TempSrc:  Oral Oral Oral  Resp:  21 16 18   Height:      Weight:      SpO2:  97% 96% 96%    Intake/Output Summary (Last 24 hours) at 10/23/12 1300 Last data filed at 10/22/12 2200  Gross per 24 hour  Intake    480 ml  Output      0 ml  Net    480 ml   Filed Weights   10/19/12 2338  Weight: 66.6 kg (146 lb 13.2 oz)    Exam:   General:  NAD; afebrile. Move four extremities on request; AAOX2 (per family close to baseline mentally)  Cardiovascular: S2 and S2; no rubs o rgallops  Respiratory: CTA  Abdomen: soft, NT, ND; denies N/V  Extremities: RLE with bandage pre-cats in place; no cyanosis. Trace edema present  Data Reviewed: Basic Metabolic Panel:  Lab 10/21/12 8119 10/20/12 0455 10/19/12 1847  NA 141 140 139  K 4.5 3.4* 3.9  CL 106 102 102  CO2 26 24 25   GLUCOSE 111* 111* 100*  BUN 10 14 16   CREATININE 0.86 0.91 0.92  CALCIUM 8.9 8.7 9.1  MG -- -- --  PHOS -- -- --   Liver Function Tests:  Lab 10/19/12 1847  AST 20  ALT 10  ALKPHOS 113  BILITOT 0.2*  PROT 7.2  ALBUMIN 3.5   CBC:  Lab 10/21/12 0906 10/20/12 0455 10/19/12 1847  WBC 10.4 9.1 11.0*  NEUTROABS -- -- 7.9*  HGB 13.0 12.5 14.1  HCT 39.2 37.3 42.3  MCV 91.8 90.1 91.8  PLT 335 325 334   Cardiac Enzymes:  Lab 10/20/12 1127 10/20/12 0455 10/20/12 0010 10/19/12 1848  CKTOTAL -- -- -- --  CKMB -- -- -- --  CKMBINDEX -- -- -- --  TROPONINI <0.30 <0.30 <0.30 <0.30   CBG:  Lab 10/20/12 2103 10/20/12 1614 10/20/12 1138 10/20/12 0600  GLUCAP  90 134* 99 115*   Studies: Ct Head Wo Contrast  10/22/2012  *RADIOLOGY REPORT*  Clinical Data: Subdural hematoma.  CT HEAD WITHOUT CONTRAST  Technique:  Contiguous axial images were obtained from the base of the skull through the vertex without contrast.  Comparison: CT head without contrast 10/21/2011. MRI of the brain 10/20/2012.  Findings: The acute blood products are less conspicuous on today's exam than previously seen.  The acute blood products are stable to slightly less conspicuous than previously seen.  There is a gradient anteriorly suggesting some breakdown.  No new hemorrhage is present.  Midline shift now measures 3.5 mm, decreased.  The patient's chronic subdural hygroma on the left remains.  Mild generalized atrophy white matter disease is stable.  No acute cortical infarct or mass lesion is present.  The ventricles are proportionate to the degree of atrophy.  The paranasal sinuses and mastoid air cells are clear.  The osseous skull is intact.  Atherosclerotic changes are present within the cavernous carotid arteries bilaterally.  IMPRESSION:  1.  Stable to slight decrease in conspicuity of the acute left subdural hematoma. 2.  Slight decrease in midline shift. 3.  Stable chronic left subdural hygroma. 4.  Stable atrophy and white matter disease.   Original Report Authenticated By: Marin Roberts, M.D.     Scheduled Meds:    . amLODipine  5 mg Oral Daily  . B-complex with vitamin C  1 tablet Oral Daily  . bacitracin   Topical BID  . donepezil  5 mg Oral QHS  . feeding supplement  237 mL Oral BID BM  . sodium chloride  3 mL Intravenous Q12H  . sodium chloride  3 mL Intravenous Q12H  . venlafaxine XR  75 mg Oral BID   Continuous Infusions:   Active Problems:  Multiple falls  Community acquired pneumonia  Displaced fracture of fifth metatarsal bone  Dementia    Time spent: >30    [facility]  [facility] Pager [phone]. If 8PM-8AM, please contact  night-coverage at [email or web address], password [facility] 10/23/2012, 1:00 PM  LOS: 4 days

## 2012-10-24 NOTE — Progress Notes (Signed)
[facility] PROGRESS NOTE  [name] [name] AOZ:[id number] DOB: 08-06-1929 DOA: 10/19/2012 PCP: Gretel Acre, MD  Assessment/Plan: 1-right foot metatarsal fracture: secondary to mechanical injury. Per ortho; plan is for conservative management and outpatient follow up. Cast for 2-3 months. Hard cast to be placed once swelling almost resolved.  2-Multiple falls: so far workup is essentially negative. Will follow vit D level and if abnormal will replete/supplement. Will follow recommendations from PT/OT, Needs SNF. Family agreeable with plan.   3-initially presumed CAP: but repeat x-ray of her lungs is clear and patient no complaining of any URI symptoms. Abx's has been discontinued; patient remains asymptomatic.  4-Swimming feeling in her head: MRI not showing any acute abnormalities; no nystagmus and no further sensation of dizziness. PT recommends SNF for rehabiliation and conditioning.  5-HTN: BP stable continue current regimen. (amlodipine)  6-Depression: continue effexor; dose reduced to 75mg  BID; plan is to treat at this dose for 1 week and then reduce to 75mg  daily.  7-Left SDH: small and without needs for neurosurgical intervention at this moment.CT repeated this morning demonstrating resolution of SDH and no midline shift.  8-Dementia with Intermittent confusion/delirium: secondary to underlying dementia and change in environment. Will continue arcept. PT has recommended SNF. SW aware of situation and helping with placement.   9-Moderate protein calorie malnutrition: due to acute illness and decrease appetite; will encourage PO intake and continue feeding supplements as recommended by dietician  DVT:SCD's  Code Status: DNR Family Communication: Son at bedside Disposition Plan: will need SNFat discharge; plan is for the patient to go to [facility] on Monday 10/25/12   Consultants: PT/OT Neurosurgery  Procedures:  See below for x-ray reports  Antibiotics:  levaquin x  2 days.  HPI/Subjective: No headaches, no fever, no acute complaints. Neurologic exam intact. AAOX3  Objective: Filed Vitals:   10/22/12 2014 10/23/12 1411 10/23/12 2002 10/24/12 0459  BP: 143/77 124/87 116/65 101/79  Pulse: 99 107 96 91  Temp: 98.5 F (36.9 C) 97.8 F (36.6 C) 98.1 F (36.7 C) 98.1 F (36.7 C)  TempSrc: Oral Oral Oral Oral  Resp: 18 16 20 18   Height:      Weight:      SpO2: 96% 98% 96% 95%    Intake/Output Summary (Last 24 hours) at 10/24/12 1404 Last data filed at 10/23/12 1412  Gross per 24 hour  Intake    120 ml  Output      0 ml  Net    120 ml   Filed Weights   10/19/12 2338  Weight: 66.6 kg (146 lb 13.2 oz)    Exam:   General:  NAD; afebrile. Move four extremities on request; AAOX2 (per family close to baseline mentally)  Cardiovascular: S2 and S2; no rubs o rgallops  Respiratory: CTA  Abdomen: soft, NT, ND; denies N/V  Extremities: RLE with bandage pre-cats in place; no cyanosis. Trace edema present; some pain with movement described.  Data Reviewed: Basic Metabolic Panel:  Lab 10/21/12 9629 10/20/12 0455 10/19/12 1847  NA 141 140 139  K 4.5 3.4* 3.9  CL 106 102 102  CO2 26 24 25   GLUCOSE 111* 111* 100*  BUN 10 14 16   CREATININE 0.86 0.91 0.92  CALCIUM 8.9 8.7 9.1  MG -- -- --  PHOS -- -- --   Liver Function Tests:  Lab 10/19/12 1847  AST 20  ALT 10  ALKPHOS 113  BILITOT 0.2*  PROT 7.2  ALBUMIN 3.5   CBC:  Lab 10/21/12  9604 10/20/12 0455 10/19/12 1847  WBC 10.4 9.1 11.0*  NEUTROABS -- -- 7.9*  HGB 13.0 12.5 14.1  HCT 39.2 37.3 42.3  MCV 91.8 90.1 91.8  PLT 335 325 334   Cardiac Enzymes:  Lab 10/20/12 1127 10/20/12 0455 10/20/12 0010 10/19/12 1848  CKTOTAL -- -- -- --  CKMB -- -- -- --  CKMBINDEX -- -- -- --  TROPONINI <0.30 <0.30 <0.30 <0.30   CBG:  Lab 10/20/12 2103 10/20/12 1614 10/20/12 1138 10/20/12 0600  GLUCAP 90 134* 99 115*   Studies: No results found.  Scheduled Meds:    . amLODipine   5 mg Oral Daily  . B-complex with vitamin C  1 tablet Oral Daily  . bacitracin   Topical BID  . donepezil  5 mg Oral QHS  . feeding supplement  237 mL Oral BID BM  . sodium chloride  3 mL Intravenous Q12H  . sodium chloride  3 mL Intravenous Q12H  . venlafaxine XR  75 mg Oral BID   Continuous Infusions:   Active Problems:  Multiple falls  Community acquired pneumonia  Displaced fracture of fifth metatarsal bone  Dementia    Time spent: < 30 minutes    [name] [name]  [facility] Pager [phone]. If 8PM-8AM, please contact night-coverage at [email or web address], password [facility] 10/24/2012, 2:04 PM  LOS: 5 days

## 2012-10-25 LAB — VITAMIN D 1,25 DIHYDROXY
Vitamin D2 1, 25 (OH)2: <8 pg/mL
Vitamin D3 1, 25 (OH)2: 31 pg/mL

## 2012-10-25 MED ORDER — TRAMADOL HCL 50 MG PO TABS: 50.0000 mg | ORAL_TABLET | Freq: Four times a day (QID) | ORAL | Status: DC | PRN

## 2012-10-25 MED ORDER — DONEPEZIL HCL 5 MG PO TABS: 5.0000 mg | ORAL_TABLET | Freq: Every day | ORAL | Status: DC

## 2012-10-25 MED ORDER — MECLIZINE HCL 12.5 MG PO TABS: 12.5000 mg | ORAL_TABLET | Freq: Three times a day (TID) | ORAL | Status: DC | PRN

## 2012-10-25 MED ORDER — ENSURE COMPLETE PO LIQD: 237.0000 mL | Freq: Two times a day (BID) | ORAL | Status: DC

## 2012-10-25 MED ORDER — VENLAFAXINE HCL ER 75 MG PO CP24: 75.0000 mg | ORAL_CAPSULE | Freq: Two times a day (BID) | ORAL | Status: DC

## 2012-10-25 NOTE — Progress Notes (Signed)
Physical Therapy Treatment Patient Details Name: [name] [name] MRN: [id number] DOB: 09-25-1928 Today's Date: 10/25/2012 Time: 2536-6440 PT Time Calculation (min): 35 min  PT Assessment / Plan / Recommendation Comments on Treatment Session  79 y/o female admitted for fall with foot fracture. Larey Seat while in the hospital and sustained SDH. Progressing well today with therapy with less confusion. Continue currently plan.     Follow Up Recommendations  SNF     Does the patient have the potential to tolerate intense rehabilitation     Barriers to Discharge        Equipment Recommendations  Rolling walker with 5" wheels    Recommendations for Other Services    Frequency Min 3X/week   Plan Discharge plan remains appropriate;Frequency remains appropriate    Precautions / Restrictions Precautions Precautions: Fall Required Braces or Orthoses: Other Brace/Splint Other Brace/Splint: post op shoe (right) Restrictions RLE Weight Bearing: Weight bearing as tolerated   Pertinent Vitals/Pain Denies pain initially, c/o slight pain in her right foot during gait but denies need for pain meds    Mobility  Bed Mobility Bed Mobility: Supine to Sit Supine to Sit: 5: Supervision Sitting - Scoot to Edge of Bed: 5: Supervision Details for Bed Mobility Assistance: cues for sequencing, supervision for safety Transfers Transfers: Sit to Stand;Stand to Sit Sit to Stand: 5: Supervision;With upper extremity assist Stand to Sit: 5: Supervision;With upper extremity assist Details for Transfer Assistance: cues for safe hand placement Ambulation/Gait Ambulation/Gait Assistance: 4: Min guard Ambulation Distance (Feet): 200 Feet Assistive device: Rolling walker Ambulation/Gait Assistance Details: cues for tall posture and forward gaze as well as safe positioning within RW Gait Pattern: Step-through pattern;Decreased stride length;Decreased weight shift to right Gait velocity: decreased    Exercises  General Exercises - Lower Extremity Ankle Circles/Pumps: AROM;Both;10 reps;Seated Long Arc Quad: AROM;Both;20 reps;Seated   PT Goals Acute Rehab PT Goals PT Goal: Supine/Side to Sit - Progress: Met PT Goal: Sit to Stand - Progress: Met PT Goal: Stand to Sit - Progress: Met PT Goal: Ambulate - Progress: Progressing toward goal  Visit Information  Last PT Received On: 10/25/12 Assistance Needed: +1    Subjective Data  Subjective: Im just blah. Just like the weather is blah.  Patient Stated Goal: rehab   Cognition  Overall Cognitive Status: Appears within functional limits for tasks assessed/performed Orientation Level: Disoriented to;Time Behavior During Session: WFL for tasks performed Cognition - Other Comments: very appropriate today, doesn't remember last week with her confusion but oriented to place and situation today; remembers that people have told her she was confused last [facility]    Balance  Static Standing Balance Static Standing - Balance Support: Bilateral upper extremity supported Static Standing - Level of Assistance: 5: Stand by assistance  End of Session PT - End of Session Equipment Utilized During Treatment: Gait belt Activity Tolerance: Patient tolerated treatment well Patient left: in chair;with call bell/phone within reach;with chair alarm set Nurse Communication: Mobility status   GP     [facility] HELEN 10/25/2012, 8:58 AM

## 2012-10-25 NOTE — Discharge Summary (Signed)
Physician Discharge Summary  [name] [name] ZOX:[id number] DOB: 08/19/1929 DOA: 10/19/2012  PCP: Gretel Acre, MD  Admit date: 10/19/2012 Discharge date: 10/25/2012  Time spent: >30 minutes  Recommendations for Outpatient Follow-up:  Reevaluate BP and adjust medications as needed BMET to follow electrolytes and kidney function Reevaluate depression and features for dementia and adjust therapy according to needs.  Discharge Diagnoses:  Active Problems:  Multiple falls  Community acquired pneumonia  Displaced fracture of fifth metatarsal bone  Dementia protein calorie malnutrition  Discharge Condition: stable and improved; will be discharge to SNF for physical rehabilitation. Follow up next week with Dr. Dion Saucier for further evaluation and treatment on her right metatarsal fracture.  Diet recommendation: low sodium heart healthy diet  Filed Weights   10/19/12 2338  Weight: 66.6 kg (146 lb 13.2 oz)    History of present illness:  79 y.o. female, with history of hypertension and arthritis, brought today to the emergency room from her doctor's office to be evaluated for increased falls. Her son was in the room and gave most of the history. The patient had right foot trauma after she hit her foot on a flower vase. Her son reports 5 falls in the last 2 weeks. Patient reports a "swimming feeling"in her head, and decreased hearing. She was supposed to follow up with ENT on February 4. No vertigo, no nausea or vomiting. Patient reports feeling congested today as if she is getting a cold. No history of loss of consciousness.   Hospital Course:  1-Right foot metatarsal fracture: secondary to mechanical injury. Per ortho; plan is for conservative management and outpatient follow up next week for Cast placement. PT/OT at SNF.   2-Multiple falls: so far workup is essentially negative and suggesting deconditioning as main cause for her falls.  Will continue B12 and Vit D level; PCP to follow vit D  level. Will follow recommendations from PT/OT, Needs SNF. Family agreeable with plan.   3-initially presumed CAP: but repeat x-ray of her lungs is clear and patient no complaining of any URI symptoms. Abx's has been discontinued; patient remains asymptomatic.   4-Swimming feeling in her head: MRI not showing any acute abnormalities; no nystagmus and no further sensation of dizziness. PT recommends SNF for rehabiliation and conditioning.   5-HTN: BP stable continue current regimen. (amlodipine)   6-Depression: continue effexor; dose reduced to 75mg  BID; plan is to treat at this dose for 1 week and then reduce to 75mg  daily after. Follow up and further arrangements per PCP as an outpatient.  7-Left SDH: small and without needs for neurosurgical intervention at this moment. CT repeated demonstrating almost complete resolution of SDH and no midline shift. Per neurosurgery no intervention needed. Patient neurologically intact.  8-Dementia with Intermittent confusion/delirium: secondary to underlying dementia and change in environment. MS back to baseline; since aricept started patient has been well oriented and focused. MMS 25. Will continue aricept; follow up with PCP as an outpatient.  9-Moderate protein calorie malnutrition: due to acute illness and decrease appetite; will encourage PO intake and continue feeding supplements ensure.  Procedures:  See below for xray reports  Consultations:  Orthopedic service (Dr. Dion Saucier)  Neurosurgery  PT/OT  Discharge Exam: Filed Vitals:   10/24/12 0459 10/24/12 1416 10/24/12 1956 10/25/12 0507  BP: 101/79 109/61 127/62 114/64  Pulse: 91 94 89 83  Temp: 98.1 F (36.7 C) 97.3 F (36.3 C) 98.3 F (36.8 C) 97.7 F (36.5 C)  TempSrc: Oral Oral Oral Oral  Resp: 18 16  12 16  Height:      Weight:      SpO2: 95% 100% 100% 98%   General: NAD; afebrile. Move four extremities on request; AAOX2 (per family close to baseline mentally)  Cardiovascular:  S2 and S2; no rubs o rgallops  Respiratory: CTA  Abdomen: soft, NT, ND; denies N/V  Extremities: RLE with bandage pre-cats in place; no cyanosis. Trace edema present; some pain with movement described.  Discharge Instructions  Discharge Orders    Future Orders Please Complete By Expires   Diet - low sodium heart healthy      Discharge instructions      Comments:   Keep patient well hydrated PT/OT for rehab at the facility Take medications as prescribed       Medication List     As of 10/25/2012 11:45 AM    STOP taking these medications         sodium phosphate enema   Commonly known as: FLEET      TAKE these medications         amLODipine 5 MG tablet   Commonly known as: NORVASC   Take 5 mg by mouth daily.      b complex vitamins tablet   Take 1 tablet by mouth daily.      bisacodyl 5 MG EC tablet   Commonly known as: DULCOLAX   Take 5 mg by mouth daily as needed.      cholecalciferol 1000 UNITS tablet   Commonly known as: VITAMIN D   Take 1,000 Units by mouth daily.      donepezil 5 MG tablet   Commonly known as: ARICEPT   Take 1 tablet (5 mg total) by mouth at bedtime.      feeding supplement Liqd   Take 237 mLs by mouth 2 (two) times daily between meals.      loratadine 10 MG tablet   Commonly known as: CLARITIN   Take 10 mg by mouth daily as needed. For allergies      meclizine 12.5 MG tablet   Commonly known as: ANTIVERT   Take 1 tablet (12.5 mg total) by mouth 3 (three) times daily as needed for dizziness.      SE-TAN PLUS 162-115.2-1 MG Caps   Take 1 capsule by mouth daily.      traMADol 50 MG tablet   Commonly known as: ULTRAM   Take 1 tablet (50 mg total) by mouth every 6 (six) hours as needed. For pain      venlafaxine XR 75 MG 24 hr capsule   Commonly known as: EFFEXOR-XR   Take 1 capsule (75 mg total) by mouth 2 (two) times daily. Take 1 tablet bid for 1 week; and then change to 1 tablet daily.           Follow-up Information     Follow up with LANDAU,JOSHUA P, MD. Call in 1 week. (next week to set up appointment; )    Contact information:   [address] [place] [address] [phone]           The results of significant diagnostics from this hospitalization (including imaging, microbiology, ancillary and laboratory) are listed below for reference.    Significant Diagnostic Studies: Dg Chest 2 View  10/20/2012  *RADIOLOGY REPORT*  Clinical Data: Cough, pneumonia.  CHEST - 2 VIEW  Comparison: October 19, 2012.  Findings: Cardiomediastinal silhouette appears normal.  No acute pulmonary disease is noted.  Bony thorax is  intact.  Hiatal hernia is unchanged.  Right lower lobe opacity noted on prior exam has resolved.  IMPRESSION: Previously described right lower lobe opacity has resolved.  No acute cardiopulmonary abnormality seen currently.   Original Report Authenticated By: Lupita Raider.,  M.D.    Ct Head Wo Contrast  10/22/2012  *RADIOLOGY REPORT*  Clinical Data: Subdural hematoma.  CT HEAD WITHOUT CONTRAST  Technique:  Contiguous axial images were obtained from the base of the skull through the vertex without contrast.  Comparison: CT head without contrast 10/21/2011. MRI of the brain 10/20/2012.  Findings: The acute blood products are less conspicuous on today's exam than previously seen.  The acute blood products are stable to slightly less conspicuous than previously seen.  There is a gradient anteriorly suggesting some breakdown.  No new hemorrhage is present.  Midline shift now measures 3.5 mm, decreased.  The patient's chronic subdural hygroma on the left remains.  Mild generalized atrophy white matter disease is stable.  No acute cortical infarct or mass lesion is present.  The ventricles are proportionate to the degree of atrophy.  The paranasal sinuses and mastoid air cells are clear.  The osseous skull is intact.  Atherosclerotic changes are present within the cavernous carotid arteries  bilaterally.  IMPRESSION:  1.  Stable to slight decrease in conspicuity of the acute left subdural hematoma. 2.  Slight decrease in midline shift. 3.  Stable chronic left subdural hygroma. 4.  Stable atrophy and white matter disease.   Original Report Authenticated By: Marin Roberts, M.D.    Ct Head Wo Contrast  10/21/2012  *RADIOLOGY REPORT*  Clinical Data:  79 year old female with fall, headache and neck pain.  CT HEAD WITHOUT CONTRAST CT CERVICAL SPINE WITHOUT CONTRAST  Technique:  Multidetector CT imaging of the head and cervical spine was performed following the standard protocol without intravenous contrast.  Multiplanar CT image reconstructions of the cervical spine were also generated.  Comparison:  10/19/2012  CT HEAD  Findings: A new left frontotemporoparietal subdural hematoma is noted with a maximal diameter of 5 mm.  4 mm left to right midline shift is noted. Atrophy and chronic small vessel white matter ischemic changes are noted. There is no evidence of intraparenchymal hemorrhage, acute infarction, or mass lesion. No acute or suspicious bony abnormalities are identified.  IMPRESSION: New left frontotemporoparietal subdural hematoma with maximal diameter of 5 mm with 4 mm left to right midline shift.  Atrophy and chronic small vessel white matter ischemic changes.  CT CERVICAL SPINE  Findings: Normal alignment is noted. There is no evidence of acute fracture, subluxation or prevertebral soft tissue swelling. Moderate to severe degenerative disc disease and spondylosis from C4-C7 identified. Multilevel facet arthropathy is identified. The soft tissue structures are within normal limits.  IMPRESSION: No static evidence of acute injury to the cervical spine.  Degenerative changes as described above.  Critical Value/emergent results were called by telephone at the time of interpretation on 10/21/2012 at 2:00 a.m. to [facility], who verbally acknowledged these results.   Original Report  Authenticated By: Harmon Pier, M.D.    Ct Head Wo Contrast  10/19/2012  *RADIOLOGY REPORT*  Clinical Data: Multiple falls with bruising to the right face.  CT HEAD WITHOUT CONTRAST  Technique:  Contiguous axial images were obtained from the base of the skull through the vertex without contrast.  Comparison: CT head 01/19/2008.  Findings: There is no evidence for acute infarction, intracranial hemorrhage, mass lesion, hydrocephalus, or extra-axial fluid.  Mild  to moderate atrophy has progressed from 2009.  There is chronic microvascular ischemic change in the periventricular and subcortical white matter.  Carotid atherosclerosis is prominently noted in the siphon regions.  There is no visible skull fracture or sinus air fluid level.  Negative orbits.  IMPRESSION: Progressive atrophy and chronic microvascular ischemic change.  No skull fracture or intracranial hemorrhage.   Original Report Authenticated By: Davonna Belling, M.D.    Ct Cervical Spine Wo Contrast  10/21/2012  *RADIOLOGY REPORT*  Clinical Data:  79 year old female with fall, headache and neck pain.  CT HEAD WITHOUT CONTRAST CT CERVICAL SPINE WITHOUT CONTRAST  Technique:  Multidetector CT imaging of the head and cervical spine was performed following the standard protocol without intravenous contrast.  Multiplanar CT image reconstructions of the cervical spine were also generated.  Comparison:  10/19/2012  CT HEAD  Findings: A new left frontotemporoparietal subdural hematoma is noted with a maximal diameter of 5 mm.  4 mm left to right midline shift is noted. Atrophy and chronic small vessel white matter ischemic changes are noted. There is no evidence of intraparenchymal hemorrhage, acute infarction, or mass lesion. No acute or suspicious bony abnormalities are identified.  IMPRESSION: New left frontotemporoparietal subdural hematoma with maximal diameter of 5 mm with 4 mm left to right midline shift.  Atrophy and chronic small vessel white matter  ischemic changes.  CT CERVICAL SPINE  Findings: Normal alignment is noted. There is no evidence of acute fracture, subluxation or prevertebral soft tissue swelling. Moderate to severe degenerative disc disease and spondylosis from C4-C7 identified. Multilevel facet arthropathy is identified. The soft tissue structures are within normal limits.  IMPRESSION: No static evidence of acute injury to the cervical spine.  Degenerative changes as described above.  Critical Value/emergent results were called by telephone at the time of interpretation on 10/21/2012 at 2:00 a.m. to [facility], who verbally acknowledged these results.   Original Report Authenticated By: Harmon Pier, M.D.    Mr Laqueta Jean Wo Contrast  10/20/2012  *RADIOLOGY REPORT*  Clinical Data: Worsening balance with falls.  Dizziness.  MRI HEAD WITHOUT AND WITH CONTRAST  Technique:  Multiplanar, multiecho pulse sequences of the brain and surrounding structures were obtained according to standard protocol without and with intravenous contrast  Contrast: 14mL MULTIHANCE GADOBENATE DIMEGLUMINE 529 MG/ML IV SOLN  Comparison: Head CT 10/19/2012  Findings: Diffusion imaging does not show any acute or subacute infarction.  The brainstem is unremarkable.  There are too small lacunar infarctions in the inferior right cerebellum.  The cerebral hemispheres show age related atrophy with moderate chronic small vessel change within the deep white matter.  There is ex vacuo enlargement of the lateral ventricles.  No suspicion of obstructive hydrocephalus.  No mass lesion, hemorrhage, hydrocephalus or subdural hematoma.  The patient does have been the subdural hygromas, 6 mm maximal thickness on the left and 2 mm maximal thickness on the right.    No pituitary mass.  No fluid in the sinuses, middle ears or mastoids.  No skull or skull base lesion.  After contrast administration, there is mild dural enhancement. This is not infrequently seen and can be due to any number  of old insults. Because of the small subdural hygromas, this is probably secondary to that, and may indicate that the patient had subdural hematomas in the distant past.  IMPRESSION: No cause of the patient's clinical change is identified.  Age related atrophy and chronic small vessel disease of the hemispheric white matter. Small  subdural hygromas bilaterally, not compressive.  The patient does have mild dural enhancement.  Usually, this is not clinically significant.  This can relate to old insults such as old head trauma or old subdurals.  Because this patient has a small low density subdural hygromas, the dural enhancement is probably related to that process.   Original Report Authenticated By: Paulina Fusi, M.D.    Dg Abd Acute W/chest  10/19/2012  *RADIOLOGY REPORT*  Clinical Data: 79 year old female with constipation.  ACUTE ABDOMEN SERIES (ABDOMEN 2 VIEW & CHEST 1 VIEW)  Comparison: 08/06/2012 and prior radiographs. 08/27/2012 abdominal CT.  Findings: The cardiomediastinal silhouette is unremarkable. COPD/emphysema is identified.  An ill-defined focal opacity overlying the right lower lung is identified and could represent a small focal area of airspace disease/pneumonia or overlapping structures.  There is no abnormality identified on the recent CT. There is no evidence of pleural effusion or pneumothorax.  The bowel gas pattern is unremarkable. There is no evidence of bowel obstruction or pneumoperitoneum. No suspicious calcifications are identified. Degenerative changes of the lumbar spine are noted.  IMPRESSION: Unremarkable bowel gas pattern.  Ill-defined focal opacity overlying the right lower lung possibly representing focal pneumonia.   Original Report Authenticated By: Harmon Pier, M.D.     Microbiology: Recent Results (from the past 240 hour(s))  URINE CULTURE     Status: Normal   Collection Time   10/19/12  9:34 PM      Component Value Range Status Comment   Specimen Description URINE,  CLEAN CATCH   Final    Special Requests NONE   Final    Culture  Setup Time 10/19/2012 23:53   Final    Colony Count 40,000 COLONIES/ML   Final    Culture     Final    Value: Multiple bacterial morphotypes present, none predominant. Suggest appropriate recollection if clinically indicated.   Report Status 10/21/2012 FINAL   Final   CULTURE, BLOOD (ROUTINE X 2)     Status: Normal (Preliminary result)   Collection Time   10/20/12 12:01 AM      Component Value Range Status Comment   Specimen Description BLOOD LEFT ARM   Final    Special Requests BOTTLES DRAWN AEROBIC AND ANAEROBIC 10CC   Final    Culture  Setup Time 10/20/2012 04:36   Final    Culture     Final    Value:        BLOOD CULTURE RECEIVED NO GROWTH TO DATE CULTURE WILL BE HELD FOR 5 DAYS BEFORE ISSUING A FINAL NEGATIVE REPORT   Report Status PENDING   Incomplete   CULTURE, BLOOD (ROUTINE X 2)     Status: Normal (Preliminary result)   Collection Time   10/20/12 12:15 AM      Component Value Range Status Comment   Specimen Description BLOOD LEFT HAND   Final    Special Requests BOTTLES DRAWN AEROBIC AND ANAEROBIC 10CC   Final    Culture  Setup Time 10/20/2012 04:36   Final    Culture     Final    Value:        BLOOD CULTURE RECEIVED NO GROWTH TO DATE CULTURE WILL BE HELD FOR 5 DAYS BEFORE ISSUING A FINAL NEGATIVE REPORT   Report Status PENDING   Incomplete   MRSA PCR SCREENING     Status: Normal   Collection Time   10/21/12  3:34 AM      Component Value Range Status Comment  MRSA by PCR NEGATIVE  NEGATIVE Final      Labs: Basic Metabolic Panel:  Lab 10/21/12 1610 10/20/12 0455 10/19/12 1847  NA 141 140 139  K 4.5 3.4* 3.9  CL 106 102 102  CO2 26 24 25   GLUCOSE 111* 111* 100*  BUN 10 14 16   CREATININE 0.86 0.91 0.92  CALCIUM 8.9 8.7 9.1  MG -- -- --  PHOS -- -- --   Liver Function Tests:  Lab 10/19/12 1847  AST 20  ALT 10  ALKPHOS 113  BILITOT 0.2*  PROT 7.2  ALBUMIN 3.5   CBC:  Lab 10/21/12 0906  10/20/12 0455 10/19/12 1847  WBC 10.4 9.1 11.0*  NEUTROABS -- -- 7.9*  HGB 13.0 12.5 14.1  HCT 39.2 37.3 42.3  MCV 91.8 90.1 91.8  PLT 335 325 334   Cardiac Enzymes:  Lab 10/20/12 1127 10/20/12 0455 10/20/12 0010 10/19/12 1848  CKTOTAL -- -- -- --  CKMB -- -- -- --  CKMBINDEX -- -- -- --  TROPONINI <0.30 <0.30 <0.30 <0.30   CBG:  Lab 10/20/12 2103 10/20/12 1614 10/20/12 1138 10/20/12 0600  GLUCAP 90 134* 99 115*    Signed:  Timm Bonenberger  [facility] 10/25/2012, 11:45 AM

## 2012-10-25 NOTE — Clinical Social Work Note (Signed)
CSW was notified by [facility] SNF that they are ready for Pt if Pt is ready for dc.  CSW will f/u with MD and facilitate Pt's dc to snf if Pt is medically cleared.   Frederico Hamman, LCSW [phone]

## 2012-10-25 NOTE — Clinical Social Work Placement (Signed)
Clinical Social Work Department CLINICAL SOCIAL WORK PLACEMENT NOTE 10/25/2012  Patient:  Mano,Summerlyn  Account Number:  [id number] Admit date:  10/19/2012  Clinical Social Worker:  Macario Golds, Theresia Majors  Date/time:  10/21/2012 11:00 AM  Clinical Social Work is seeking post-discharge placement for this patient at the following level of care:   SKILLED NURSING   (*CSW will update this form in Epic as items are completed)   10/21/2012  Patient/family provided with [facility] Department of Clinical Social Work's list of facilities offering this level of care within the geographic area requested by the patient (or if unable, by the patient's family).  10/21/2012  Patient/family informed of their freedom to choose among providers that offer the needed level of care, that participate in Medicare, Medicaid or managed care program needed by the patient, have an available bed and are willing to accept the patient.  10/21/2012  Patient/family informed of [facility]' ownership interest in [facility], as well as of the fact that they are under no obligation to receive care at this facility.  PASARR submitted to EDS on 10/21/2012 PASARR number received from [employer] on   FL2 transmitted to all facilities in geographic area requested by pt/family on  10/21/2012 FL2 transmitted to all facilities within larger geographic area on   Patient informed that his/her managed care company has contracts with or will negotiate with  certain facilities, including the following:     Patient/family informed of bed offers received:  10/22/2012 Patient chooses bed at [facility] Physician recommends and patient chooses bed at    Patient to be transferred to [facility] on  10/25/2012 Patient to be transferred to facility by family  The following physician request were entered in Epic:   Additional Comments: 01/31 - Patient son would like to be close to [facility]  Frederico Hamman,  [phone]

## 2012-10-26 LAB — CULTURE, BLOOD (ROUTINE X 2): Culture: NO GROWTH

## 2012-12-03 ENCOUNTER — Observation Stay (HOSPITAL_COMMUNITY)
Admission: EM | Admit: 2012-12-03 | Discharge: 2012-12-04 | Disposition: A | Discharge status: Home / Self Care | Payer: Medicare Other | Attending: Internal Medicine | Admitting: Internal Medicine

## 2012-12-03 ENCOUNTER — Encounter (HOSPITAL_COMMUNITY): Payer: Self-pay | Admitting: Internal Medicine

## 2012-12-03 ENCOUNTER — Emergency Department (HOSPITAL_COMMUNITY): Payer: Medicare Other

## 2012-12-03 DIAGNOSIS — E876 Hypokalemia: Secondary | ICD-10-CM | POA: Insufficient documentation

## 2012-12-03 DIAGNOSIS — F32A Depression, unspecified: Secondary | ICD-10-CM | POA: Diagnosis present

## 2012-12-03 DIAGNOSIS — F329 Major depressive disorder, single episode, unspecified: Secondary | ICD-10-CM | POA: Diagnosis present

## 2012-12-03 DIAGNOSIS — R296 Repeated falls: Secondary | ICD-10-CM

## 2012-12-03 DIAGNOSIS — R079 Chest pain, unspecified: Principal | ICD-10-CM | POA: Diagnosis present

## 2012-12-03 DIAGNOSIS — F0390 Unspecified dementia without behavioral disturbance: Secondary | ICD-10-CM | POA: Diagnosis present

## 2012-12-03 DIAGNOSIS — K449 Diaphragmatic hernia without obstruction or gangrene: Secondary | ICD-10-CM | POA: Insufficient documentation

## 2012-12-03 DIAGNOSIS — I10 Essential (primary) hypertension: Secondary | ICD-10-CM | POA: Diagnosis present

## 2012-12-03 DIAGNOSIS — Z9181 History of falling: Secondary | ICD-10-CM

## 2012-12-03 LAB — BASIC METABOLIC PANEL
BUN: 12 mg/dL (ref 6–23)
Calcium: 9 mg/dL (ref 8.4–10.5)
GFR calc Af Amer: 60 mL/min — ABNORMAL LOW (ref >90)
GFR calc non Af Amer: 52 mL/min — ABNORMAL LOW (ref >90)
Potassium: 3.6 mEq/L (ref 3.5–5.1)
Sodium: 138 mEq/L (ref 135–145)

## 2012-12-03 LAB — TROPONIN I: Troponin I: <0.3 ng/mL (ref <0.30)

## 2012-12-03 LAB — CBC
HCT: 40.4 % (ref 36.0–46.0)
MCH: 31 pg (ref 26.0–34.0)
MCHC: 34.9 g/dL (ref 30.0–36.0)
RDW: 13.2 % (ref 11.5–15.5)

## 2012-12-03 LAB — POCT I-STAT TROPONIN I

## 2012-12-03 MED ORDER — ACETAMINOPHEN 650 MG RE SUPP: 650.0000 mg | Freq: Four times a day (QID) | RECTAL | Status: DC | PRN

## 2012-12-03 MED ORDER — ALUM & MAG HYDROXIDE-SIMETH 200-200-20 MG/5ML PO SUSP: 30.0000 mL | Freq: Four times a day (QID) | ORAL | Status: DC | PRN

## 2012-12-03 MED ORDER — NITROGLYCERIN 2 % TD OINT
0.5000 [in_us] | TOPICAL_OINTMENT | Freq: Four times a day (QID) | TRANSDERMAL | Status: DC
  Administered 2012-12-03 – 2012-12-04 (×3): 0.5 [in_us] via TOPICAL
  Filled 2012-12-03: qty 1
  Filled 2012-12-03: qty 30

## 2012-12-03 MED ORDER — VENLAFAXINE HCL ER 75 MG PO CP24
75.0000 mg | ORAL_CAPSULE | Freq: Every day | ORAL | Status: DC
  Administered 2012-12-03: 75 mg via ORAL
  Filled 2012-12-03 (×2): qty 1

## 2012-12-03 MED ORDER — LORAZEPAM 0.5 MG PO TABS
0.5000 mg | ORAL_TABLET | Freq: Once | ORAL | Status: AC
  Administered 2012-12-03: 0.5 mg via ORAL
  Filled 2012-12-03: qty 1

## 2012-12-03 MED ORDER — DONEPEZIL HCL 10 MG PO TABS
10.0000 mg | ORAL_TABLET | Freq: Every day | ORAL | Status: DC
  Administered 2012-12-03: 10 mg via ORAL
  Filled 2012-12-03 (×2): qty 1

## 2012-12-03 MED ORDER — SODIUM CHLORIDE 0.9 % IJ SOLN: 3.0000 mL | INTRAMUSCULAR | Status: DC | PRN

## 2012-12-03 MED ORDER — ONDANSETRON HCL 4 MG/2ML IJ SOLN: 4.0000 mg | Freq: Four times a day (QID) | INTRAMUSCULAR | Status: DC | PRN

## 2012-12-03 MED ORDER — VITAMIN D3 25 MCG (1000 UNIT) PO TABS
1000.0000 [IU] | ORAL_TABLET | Freq: Every morning | ORAL | Status: DC
  Administered 2012-12-04: 1000 [IU] via ORAL
  Filled 2012-12-03: qty 1

## 2012-12-03 MED ORDER — OXYCODONE HCL 5 MG PO TABS: 5.0000 mg | ORAL_TABLET | ORAL | Status: DC | PRN

## 2012-12-03 MED ORDER — ENOXAPARIN SODIUM 30 MG/0.3ML ~~LOC~~ SOLN
30.0000 mg | SUBCUTANEOUS | Status: DC
  Administered 2012-12-03: 30 mg via SUBCUTANEOUS
  Filled 2012-12-03 (×3): qty 0.3

## 2012-12-03 MED ORDER — SENNA 8.6 MG PO TABS
1.0000 | ORAL_TABLET | ORAL | Status: DC
  Administered 2012-12-04: 8.6 mg via ORAL
  Filled 2012-12-03: qty 1

## 2012-12-03 MED ORDER — TRAMADOL HCL 50 MG PO TABS
50.0000 mg | ORAL_TABLET | Freq: Two times a day (BID) | ORAL | Status: DC | PRN
  Administered 2012-12-04: 100 mg via ORAL
  Filled 2012-12-03 (×2): qty 1

## 2012-12-03 MED ORDER — LORATADINE 10 MG PO TABS: 10.0000 mg | ORAL_TABLET | Freq: Every day | ORAL | Status: DC | PRN

## 2012-12-03 MED ORDER — ONDANSETRON HCL 4 MG PO TABS: 4.0000 mg | ORAL_TABLET | Freq: Four times a day (QID) | ORAL | Status: DC | PRN

## 2012-12-03 MED ORDER — ACETAMINOPHEN 325 MG PO TABS: 650.0000 mg | ORAL_TABLET | Freq: Four times a day (QID) | ORAL | Status: DC | PRN

## 2012-12-03 MED ORDER — HYDROMORPHONE HCL PF 1 MG/ML IJ SOLN: 0.5000 mg | INTRAMUSCULAR | Status: DC | PRN

## 2012-12-03 MED ORDER — SODIUM CHLORIDE 0.9 % IV SOLN: 250.0000 mL | INTRAVENOUS | Status: DC | PRN

## 2012-12-03 MED ORDER — AMLODIPINE BESYLATE 5 MG PO TABS
5.0000 mg | ORAL_TABLET | Freq: Every morning | ORAL | Status: DC
  Administered 2012-12-04: 5 mg via ORAL
  Filled 2012-12-03: qty 1

## 2012-12-03 MED ORDER — SODIUM CHLORIDE 0.9 % IJ SOLN
3.0000 mL | Freq: Two times a day (BID) | INTRAMUSCULAR | Status: DC
  Administered 2012-12-03 – 2012-12-04 (×2): 3 mL via INTRAVENOUS

## 2012-12-03 MED ORDER — FLUTICASONE PROPIONATE 50 MCG/ACT NA SUSP
2.0000 | Freq: Every day | NASAL | Status: DC
  Administered 2012-12-04: 2 via NASAL
  Filled 2012-12-03: qty 16

## 2012-12-03 NOTE — ED Notes (Signed)
Admitting MD at bedside.

## 2012-12-03 NOTE — H&P (Signed)
[facility] History and Physical  [name] [name] ZOX:[id number] DOB: [birth year] DOA: 12/03/2012  Referring physician: EDP PCP: [name] Acre, MD  Specialists:   Chief Complaint: Chest Pain  HPI: [name] [name] is a 79 y.o. female who presents to the ED with complaints of chest pain that began at 4 pm and lasted about  30 minutes until after she had been given aspirin and nitroglycerin by her PCP.   She described the pain as tightness across her chest and rated the pain as a 7/10.   She had associated symptoms of SOB and nausea and denies having diaphoresis.   In the ED, she was evaluated and found to have a negative troponin X 1, and her EKG was unchanged from her previous.      Review of Systems: The patient denies anorexia, fever, chills, weight loss, vision loss, decreased hearing, hoarseness, syncope, dyspnea on exertion, peripheral edema, balance deficits, hemoptysis, abdominal pain, nausea, vomiting, diarrhea, hematemesis, melena, hematochezia, severe indigestion/heartburn, hematuria, incontinence, muscle weakness, suspicious skin lesions, transient blindness, difficulty walking, depression, unusual weight change, abnormal bleeding, enlarged lymph nodes, angioedema, and breast masses.    Past Medical History  Diagnosis Date  . Hypertension   . Arthritis   . Hiatal hernia     GERD  . Iron deficiency anemia   . DJD (degenerative joint disease)     Hands  . Depression    Past Surgical History  Procedure Laterality Date  . Orif radial fracture  09/2007     Medications:  HOME MEDS: Prior to Admission medications   Medication Sig Start Date End Date Taking? Authorizing Provider  amLODipine (NORVASC) 5 MG tablet Take 5 mg by mouth every morning.    Yes Historical Provider, MD  cholecalciferol (VITAMIN D) 1000 UNITS tablet Take 1,000 Units by mouth every morning.    Yes Historical Provider, MD  donepezil (ARICEPT) 10 MG tablet Take 10 mg by mouth at bedtime.   Yes Historical  Provider, MD  fluticasone (FLONASE) 50 MCG/ACT nasal spray Place 2 sprays into the nose daily.   Yes Historical Provider, MD  loratadine (CLARITIN) 10 MG tablet Take 10 mg by mouth daily as needed. For allergies   Yes Historical Provider, MD  senna (SENOKOT) 8.6 MG TABS Take 1 tablet by mouth every other day.   Yes Historical Provider, MD  traMADol (ULTRAM) 50 MG tablet Take 50-100 mg by mouth 2 (two) times daily as needed for pain (pt will take 50 mg in the a.m. and 100 mg in the p.m. if needed for pain).   Yes Historical Provider, MD  venlafaxine XR (EFFEXOR-XR) 75 MG 24 hr capsule Take 75 mg by mouth at bedtime.   Yes Historical Provider, MD    Allergies:  Allergies  Allergen Reactions  . Sulfa Antibiotics Itching    Social History:   reports that she has quit smoking. She does not have any smokeless tobacco history on file. She reports that she does not drink alcohol or use illicit drugs.  Family History: Family History  Problem Relation Age of Onset  . Diabetes Father   . Stroke Father   . Atrial fibrillation Mother   . Hypertension Father   . Hypertension Mother   . CAD Maternal Grandfather   . Cancer Paternal Grandmother       Physical Exam:  GEN:  Pleasant Elderly obese 79 year old Caucasian Female examined  and in no acute distress; cooperative with exam Filed Vitals:   12/03/12 1750 12/03/12 1800  12/03/12 1830 12/03/12 1915  BP: 118/74 154/71 131/86 153/76  Pulse:  86 66 93  Temp: 98.3 F (36.8 C)     TempSrc: Oral     Resp: 15 20 15 13   SpO2: 97% 98% 97% 98%   Blood pressure 153/76, pulse 93, temperature 98.3 F (36.8 C), temperature source Oral, resp. rate 13, SpO2 98.00%. PSYCH: She is alert and oriented x4; does not appear anxious does not appear depressed; affect is normal HEENT: Normocephalic and Atraumatic, Mucous membranes pink; PERRLA; EOM intact; Fundi:  Benign;  No scleral icterus, Nares: Patent, Oropharynx: Clear, Fair Dentition, Neck:  FROM, no  cervical lymphadenopathy nor thyromegaly or carotid bruit; no JVD; Breasts:: Not examined CHEST WALL: No tenderness CHEST: Normal respiration, clear to auscultation bilaterally HEART: Regular rate and rhythm; no murmurs rubs or gallops BACK: No kyphosis or scoliosis; no CVA tenderness ABDOMEN: Positive Bowel Sounds, Obese, soft non-tender; no masses, no organomegaly.    Rectal Exam: Not done EXTREMITIES: No cyanosis, clubbing or edema; no ulcerations. Genitalia: not examined PULSES: 2+ and symmetric SKIN: Normal hydration no rash or ulceration CNS: Cranial nerves 2-12 grossly intact no focal neurologic deficit     Labs on Admission:  Basic Metabolic Panel:  Recent Labs Lab 12/03/12 1807  NA 138  K 3.6  CL 101  CO2 26  GLUCOSE 92  BUN 12  CREATININE 0.99  CALCIUM 9.0   Liver Function Tests: No results found for this basename: AST, ALT, ALKPHOS, BILITOT, PROT, ALBUMIN,  in the last 168 hours No results found for this basename: LIPASE, AMYLASE,  in the last 168 hours No results found for this basename: AMMONIA,  in the last 168 hours CBC:  Recent Labs Lab 12/03/12 1807  WBC 10.5  HGB 14.1  HCT 40.4  MCV 88.8  PLT 365   Cardiac Enzymes: No results found for this basename: CKTOTAL, CKMB, CKMBINDEX, TROPONINI,  in the last 168 hours  BNP (last 3 results)  Recent Labs  12/03/12 1807  PROBNP 119.6   CBG: No results found for this basename: GLUCAP,  in the last 168 hours  Radiological Exams on Admission: Dg Chest 2 View  12/03/2012  *RADIOLOGY REPORT*  Clinical Data: Chest pain, hypertension.  History of smoking.  CHEST - 2 VIEW  Comparison: 10/20/2012  Findings: Heart size is normal.  There is perihilar peribronchial thickening.  Small hiatal hernia is present.  There are no focal consolidations or pleural effusions.  Degenerative changes are seen in the spine.  IMPRESSION:  1.  Bronchitic changes. 2. No focal pulmonary abnormality. 3.  Small hiatal hernia.    Original Report Authenticated By: Norva Pavlov, M.D.     EKG:  Normal sinus Rhythm, +RBBB, and LAFB.   No acute changes  Assessment/Plan Principal Problem:   Chest pain Active Problems:   Hypertension   Depression   Multiple falls   Dementia   1.   Chest Pain- admitted to telemetry bed Observation Status, cycle troponins, placed on Nitropaste, O2, and ASA therapy.  2.   HTN-   Continue Amlodipine.   Monitor BP.    3.   Multiple Falls-   PT consult.     4.   Depression-  Continue  Effexor.     5.   Dementia-  Continue Aricept.     Code Status:    DO NOT RESUSCITATE  (DNR) Family Communication:    Both of her Sisters are at the Bedside    Disposition Plan:  Return to her [facility] on Discharge  Time spent: 60 minutes  Ron Parker [facility] Pager [phone]  If 7PM-7AM, please contact night-coverage [email or web address] Password [facility] 12/03/2012, 8:27 PM

## 2012-12-03 NOTE — ED Provider Notes (Addendum)
History     CSN: [id number]  Arrival date & time 12/03/12  1738   First MD Initiated Contact with Patient 12/03/12 1842      Chief Complaint  Patient presents with  . Chest Pain    (Consider location/radiation/quality/duration/timing/severity/associated sxs/prior treatment) HPI Comments: Patient history of hypertension presents with chest pain. She states that she was driving with her sisters and had an onset of tightness across the left side of her chest with some radiation over to the right side. She describes as a heaviness. It was associated with shortness of breath and nausea. She also was diaphoretic with the pain. She drove to her primary care physician's office and at that point they gave her nitroglycerin and baby aspirin at which point the chest pain resolved. She's had no further episodes of chest pain. She's had no past history of heart problems. She states it was a constant pressure type feeling prior to the nitroglycerin. She denies any pleuritic-type pain. She currently denies any chest pain or shortness of breath. She does have a history of a toe fracture and her right leg is in a walking boot however she denies any calf tenderness or increased swelling to the leg.   Past Medical History  Diagnosis Date  . Hypertension   . Arthritis   . Hiatal hernia     GERD  . Iron deficiency anemia   . DJD (degenerative joint disease)     Hands  . Depression     Past Surgical History  Procedure Laterality Date  . Orif radial fracture  09/2007    Family History  Problem Relation Age of Onset  . Diabetes Father   . Stroke Father   . Atrial fibrillation Mother     History  Substance Use Topics  . Smoking status: Former Smoker -- 1.00 packs/day  . Smokeless tobacco: Not on file  . Alcohol Use: No    OB History   Grav Para Term Preterm Abortions TAB SAB Ect Mult Living                  Review of Systems  Constitutional: Positive for diaphoresis. Negative for fever,  chills and fatigue.  HENT: Negative for congestion, rhinorrhea and sneezing.   Eyes: Negative.   Respiratory: Positive for shortness of breath. Negative for cough and chest tightness.   Cardiovascular: Positive for chest pain. Negative for leg swelling.  Gastrointestinal: Positive for nausea. Negative for vomiting, abdominal pain, diarrhea and blood in stool.  Genitourinary: Negative for frequency, hematuria, flank pain and difficulty urinating.  Musculoskeletal: Negative for back pain and arthralgias.  Skin: Negative for rash.  Neurological: Negative for dizziness, speech difficulty, weakness, numbness and headaches.    Allergies  Sulfa antibiotics  Home Medications   Current Outpatient Rx  Name  Route  Sig  Dispense  Refill  . amLODipine (NORVASC) 5 MG tablet   Oral   Take 5 mg by mouth every morning.          . cholecalciferol (VITAMIN D) 1000 UNITS tablet   Oral   Take 1,000 Units by mouth every morning.          . donepezil (ARICEPT) 10 MG tablet   Oral   Take 10 mg by mouth at bedtime.         . fluticasone (FLONASE) 50 MCG/ACT nasal spray   Nasal   Place 2 sprays into the nose daily.         Marland Kitchen loratadine (CLARITIN)  10 MG tablet   Oral   Take 10 mg by mouth daily as needed. For allergies         . senna (SENOKOT) 8.6 MG TABS   Oral   Take 1 tablet by mouth every other day.         . traMADol (ULTRAM) 50 MG tablet   Oral   Take 50-100 mg by mouth 2 (two) times daily as needed for pain (pt will take 50 mg in the a.m. and 100 mg in the p.m. if needed for pain).         Marland Kitchen venlafaxine XR (EFFEXOR-XR) 75 MG 24 hr capsule   Oral   Take 75 mg by mouth at bedtime.           BP 153/76  Pulse 93  Temp(Src) 98.3 F (36.8 C) (Oral)  Resp 13  SpO2 98%  Physical Exam  Constitutional: She is oriented to person, place, and time. She appears well-developed and well-nourished.  HENT:  Head: Normocephalic and atraumatic.  Eyes: Pupils are equal,  round, and reactive to light.  Neck: Normal range of motion. Neck supple.  Cardiovascular: Normal rate, regular rhythm and normal heart sounds.   Pulmonary/Chest: Effort normal and breath sounds normal. No respiratory distress. She has no wheezes. She has no rales. She exhibits no tenderness.  Abdominal: Soft. Bowel sounds are normal. There is no tenderness. There is no rebound and no guarding.  Musculoskeletal: Normal range of motion. She exhibits no edema and no tenderness.  Lymphadenopathy:    She has no cervical adenopathy.  Neurological: She is alert and oriented to person, place, and time.  Skin: Skin is warm and dry. No rash noted.  Psychiatric: She has a normal mood and affect.    ED Course  Procedures (including critical care time)  Results for orders placed during the hospital encounter of 12/03/12  CBC      Result Value Range   WBC 10.5  4.0 - 10.5 K/uL   RBC 4.55  3.87 - 5.11 MIL/uL   Hemoglobin 14.1  12.0 - 15.0 g/dL   HCT 16.1  09.6 - 04.5 %   MCV 88.8  78.0 - 100.0 fL   MCH 31.0  26.0 - 34.0 pg   MCHC 34.9  30.0 - 36.0 g/dL   RDW 40.9  81.1 - 91.4 %   Platelets 365  150 - 400 K/uL  BASIC METABOLIC PANEL      Result Value Range   Sodium 138  135 - 145 mEq/L   Potassium 3.6  3.5 - 5.1 mEq/L   Chloride 101  96 - 112 mEq/L   CO2 26  19 - 32 mEq/L   Glucose, Bld 92  70 - 99 mg/dL   BUN 12  6 - 23 mg/dL   Creatinine, Ser 7.82  0.50 - 1.10 mg/dL   Calcium 9.0  8.4 - 95.6 mg/dL   GFR calc non Af Amer 52 (*) >90 mL/min   GFR calc Af Amer 60 (*) >90 mL/min  PRO B NATRIURETIC PEPTIDE      Result Value Range   Pro B Natriuretic peptide (BNP) 119.6  0 - 450 pg/mL  POCT I-STAT TROPONIN I      Result Value Range   Troponin i, poc 0.00  0.00 - 0.08 ng/mL   Comment 3            Dg Chest 2 View  12/03/2012  *RADIOLOGY REPORT*  Clinical Data: Chest  pain, hypertension.  History of smoking.  CHEST - 2 VIEW  Comparison: 10/20/2012  Findings: Heart size is normal.  There is  perihilar peribronchial thickening.  Small hiatal hernia is present.  There are no focal consolidations or pleural effusions.  Degenerative changes are seen in the spine.  IMPRESSION:  1.  Bronchitic changes. 2. No focal pulmonary abnormality. 3.  Small hiatal hernia.   Original Report Authenticated By: Norva Pavlov, M.D.       Date: 12/03/2012  Rate: 93  Rhythm: normal sinus rhythm  QRS Axis: left  Intervals: normal  ST/T Wave abnormalities: nonspecific ST/T changes  Conduction Disutrbances:right bundle branch block and left anterior fascicular block  Narrative Interpretation:   Old EKG Reviewed: none available   1. Chest pain       MDM  PT with chest pain, relieved with nitro.  Will consult hospitalist for admission  Old EKG was found and todays appears unchanged      Rolan Bucco, MD 12/03/12 1930  Rolan Bucco, MD 12/03/12 1478  Rolan Bucco, MD 12/03/12 1932

## 2012-12-03 NOTE — ED Notes (Signed)
Per EMS: Pt driving with sudden onset 8/10 CP w/ nausea. Reports radiation to right arm. Pt went to PCP given 325 aspirin and 1 nitro which relieved pain. Pt pain free at this time. 130/78. 88 SR with right bundle block. 98% RA. Pt reports increased stress. AO x4.

## 2012-12-04 LAB — CBC
Hemoglobin: 13.1 g/dL (ref 12.0–15.0)
MCHC: 33.4 g/dL (ref 30.0–36.0)
RBC: 4.39 MIL/uL (ref 3.87–5.11)

## 2012-12-04 LAB — BASIC METABOLIC PANEL
GFR calc Af Amer: 58 mL/min — ABNORMAL LOW (ref >90)
GFR calc non Af Amer: 50 mL/min — ABNORMAL LOW (ref >90)
Potassium: 3 mEq/L — ABNORMAL LOW (ref 3.5–5.1)
Sodium: 141 mEq/L (ref 135–145)

## 2012-12-04 LAB — TROPONIN I: Troponin I: <0.3 ng/mL (ref <0.30)

## 2012-12-04 MED ORDER — ASPIRIN EC 81 MG PO TBEC
81.0000 mg | DELAYED_RELEASE_TABLET | Freq: Every day | ORAL | Status: DC
  Administered 2012-12-04: 81 mg via ORAL
  Filled 2012-12-04: qty 1

## 2012-12-04 MED ORDER — NITROGLYCERIN 0.4 MG SL SUBL: 0.4000 mg | SUBLINGUAL_TABLET | SUBLINGUAL | Status: AC | PRN

## 2012-12-04 MED ORDER — ASPIRIN 81 MG PO TBEC: 81.0000 mg | DELAYED_RELEASE_TABLET | Freq: Every day | ORAL | Status: DC

## 2012-12-04 MED ORDER — POTASSIUM CHLORIDE CRYS ER 20 MEQ PO TBCR
40.0000 meq | EXTENDED_RELEASE_TABLET | Freq: Once | ORAL | Status: AC
  Administered 2012-12-04: 40 meq via ORAL
  Filled 2012-12-04: qty 2

## 2012-12-04 MED ORDER — POTASSIUM CHLORIDE ER 10 MEQ PO TBCR: 20.0000 meq | EXTENDED_RELEASE_TABLET | Freq: Every day | ORAL | Status: DC

## 2012-12-04 NOTE — Discharge Summary (Signed)
[facility]  Physician Discharge Summary   Patient ID: [name] [name] MRN: [id number] DOB/AGE: November 20, 1928 79 y.o.  Admit date: 12/03/2012 Discharge date: 12/04/2012  PCP: Gretel Acre, MD  DISCHARGE DIAGNOSES:  Principal Problem:   Chest pain Active Problems:   Hypertension   Depression   Multiple falls   Dementia   RECOMMENDATIONS FOR OUTPATIENT FOLLOW UP: 1. Needs close follow up for chest pain. Will likely need OP stress test. 2. Per PT at hospital she needs to be in ALF and not ILF. Will also need Home health PT at ALF.   DISCHARGE CONDITION: fair  Diet recommendation: Low Sodium  Filed Weights   12/03/12 2224  Weight: 62.914 kg (138 lb 11.2 oz)    INITIAL HISTORY: [name] [name] is a 79 y.o. female who presented to the ED with complaints of chest pain that began at 4 pm on day of admission and lasted about 30 minutes until after she had been given aspirin and nitroglycerin by her PCP. She described the pain as tightness across her chest and rated the pain as a 7/10. She had associated symptoms of SOB and nausea and denies having diaphoresis. In the ED, she was evaluated and found to have a negative troponin X 1, and her EKG was unchanged from her previous.   Consultations:  None  Procedures:  None  HOSPITAL COURSE:   Chest Pain  Seems to be ruling out for ACS. Pain had certain concerning features suggestive of CAD. She had a negative stress test in 2006. Will need further evaluation. This can be pursued as outpatient by PCP as patient is currently stable, without pain and has ruled out. She is not on aspirin at home. This will be recommended. She will be given prescription for nitroglycerin tablets as well.   History of HTN  Continue Amlodipine.  Multiple Falls with recent right toe fracture  Patient was seen by PT. They recommend ALF instead of ILF and that too with HH PT. Family intersted in speaking with CSW regarding this matter. Once CSW has  spoken to the family patient can be discharged with her sisters. She will stay with one of her sisters till ALF is arranged. Right LE examined and no abnormalities noted. Splint was removed briefly.   Hypokalemia  Replete. Prescribe some tablets for home.  Depression  Continue Effexor.   Dementia  Continue Aricept.   Overall patient remains stable. She is pain free. Troponins are normal. She is stable for discharge medically. From safety perspective she can go to her sister's place till placement at ALF can be arranged. She will need home health PT at the ALF.   PERTINENT LABS:  The results of significant diagnostics from this hospitalization (including imaging, microbiology, ancillary and laboratory) are listed below for reference.     Labs: Basic Metabolic Panel:  Recent Labs Lab 12/03/12 1807 12/04/12 0225  NA 138 141  K 3.6 3.0*  CL 101 104  CO2 26 27  GLUCOSE 92 98  BUN 12 14  CREATININE 0.99 1.02  CALCIUM 9.0 8.6   CBC:  Recent Labs Lab 12/03/12 1807 12/04/12 0225  WBC 10.5 10.4  HGB 14.1 13.1  HCT 40.4 39.2  MCV 88.8 89.3  PLT 365 400   Cardiac Enzymes:  Recent Labs Lab 12/03/12 2043 12/04/12 0225 12/04/12 0827  TROPONINI <0.30 <0.30 <0.30   BNP: BNP (last 3 results)  Recent Labs  12/03/12 1807  PROBNP 119.6   IMAGING STUDIES Dg Chest 2 View  12/03/2012  *RADIOLOGY REPORT*  Clinical Data: Chest pain, hypertension.  History of smoking.  CHEST - 2 VIEW  Comparison: 10/20/2012  Findings: Heart size is normal.  There is perihilar peribronchial thickening.  Small hiatal hernia is present.  There are no focal consolidations or pleural effusions.  Degenerative changes are seen in the spine.  IMPRESSION:  1.  Bronchitic changes. 2. No focal pulmonary abnormality. 3.  Small hiatal hernia.   Original Report Authenticated By: Norva Pavlov, M.D.     DISCHARGE EXAMINATION: See progress note from earlier today  DISPOSITION: Home with  sister  Discharge Orders   Future Orders Complete By Expires     Diet - low sodium heart healthy  As directed     Discharge instructions  As directed     Comments:      Please stay with your sister till assisted living facility is arranged. Please see your PCP this week for further management of the chest pain. You will also need Physical therapy at the assisted living facility which can be arranged by your PCP. Please seek attention if chest pain recurs and not relieved by nitroglycerin.    Increase activity slowly  As directed       Current Discharge Medication List    START taking these medications   Details  aspirin EC 81 MG EC tablet Take 1 tablet (81 mg total) by mouth daily. Qty: 30 tablet, Refills: 1    nitroGLYCERIN (NITROSTAT) 0.4 MG SL tablet Place 1 tablet (0.4 mg total) under the tongue every 5 (five) minutes as needed for chest pain. Qty: 30 tablet, Refills: 0    potassium chloride (K-DUR) 10 MEQ tablet Take 2 tablets (20 mEq total) by mouth daily. For 5 days Qty: 5 tablet, Refills: 0      CONTINUE these medications which have NOT CHANGED   Details  amLODipine (NORVASC) 5 MG tablet Take 5 mg by mouth every morning.     cholecalciferol (VITAMIN D) 1000 UNITS tablet Take 1,000 Units by mouth every morning.     donepezil (ARICEPT) 10 MG tablet Take 10 mg by mouth at bedtime.    fluticasone (FLONASE) 50 MCG/ACT nasal spray Place 2 sprays into the nose daily.    loratadine (CLARITIN) 10 MG tablet Take 10 mg by mouth daily as needed. For allergies    senna (SENOKOT) 8.6 MG TABS Take 1 tablet by mouth every other day.    traMADol (ULTRAM) 50 MG tablet Take 50-100 mg by mouth 2 (two) times daily as needed for pain (pt will take 50 mg in the a.m. and 100 mg in the p.m. if needed for pain).    venlafaxine XR (EFFEXOR-XR) 75 MG 24 hr capsule Take 75 mg by mouth at bedtime.       Follow-up Information   Follow up with NNODI, ADAKU, MD. Schedule an appointment as soon  as possible for a visit in 3 days. (Post hospitalization follow up to discuss further management of chest pain.)    Contact information:   [address] [place] [address] [phone]       TOTAL DISCHARGE TIME: 35 mins  [facility]  [facility] Pager [phone]  12/04/2012, 11:51 AM

## 2012-12-04 NOTE — Clinical Social Work Psychosocial (Signed)
Clinical Social Work Department BRIEF PSYCHOSOCIAL ASSESSMENT 12/04/2012  Patient:  [name] [name],[name] [name]     Account Number:  [id number]     Admit date:  12/03/2012  Clinical Social Worker:  INGRAM,COBI, LCSWA  Date/Time:  12/04/2012 01:48 PM  Referred by:  RN  Date Referred:  12/04/2012 Referred for  Other - See comment   Other Referral:   Interview type:  Family Other interview type:    PSYCHOSOCIAL DATA Living Status:  FAMILY Admitted from facility:   Level of care:   Primary support name:  Ann Patterson Primary support relationship to patient:  FAMILY Degree of support available:   Adequate    CURRENT CONCERNS Current Concerns  Post-Acute Placement   Other Concerns:    SOCIAL WORK ASSESSMENT / PLAN CSW met patient and two sisters at patient at bedside. Patient lives with sister and were arranging for patient to live in independent living at [facility]. Physical therapy and physicians told sisters patient should instead be placed in ALF with home health or in SNF. CSW explained differences in care between the two types of facilities. Provided ALF and SNF Lists. Dr. is discharging patient today to sisters home. Sisters plan to pursue ALF or SNF placement on their own.   Assessment/plan status:  Information/Referral to Community Resources Other assessment/ plan:   Information/referral to community resources:   ALF list, SNF list    PATIENT'S/FAMILY'S RESPONSE TO PLAN OF CARE: Patient and sisters thanked CSW for information. CSW signing off.    Cobi Ingram, LCSWA [phone] (weekend)      

## 2012-12-04 NOTE — Progress Notes (Signed)
[facility] PROGRESS NOTE  Madolin Twaddle ZOX:[id number] DOB: 01/23/1929 DOA: 12/03/2012  PCP: Gretel Acre, MD  Brief HPI: [name] [name] is a 79 y.o. female who presented to the ED with complaints of chest pain that began at 4 pm on day of admission and lasted about 30 minutes until after she had been given aspirin and nitroglycerin by her PCP. She described the pain as tightness across her chest and rated the pain as a 7/10. She had associated symptoms of SOB and nausea and denies having diaphoresis. In the ED, she was evaluated and found to have a negative troponin X 1, and her EKG was unchanged from her previous.   Past medical history:  Past Medical History  Diagnosis Date  . Hypertension   . Arthritis   . Hiatal hernia     GERD  . Iron deficiency anemia   . DJD (degenerative joint disease)     Hands  . Depression     Consultants: None  Procedures: None  Antibiotics: None  Subjective: Patient feels better. Mentions some pain in lower back since lying in bed. Denies any chest pain. This resolved with nitro. Patient doesn't quite recall yesterday's events. Has had multiple falls at home.  Objective: Vital Signs  Filed Vitals:   12/03/12 2045 12/03/12 2224 12/04/12 0513 12/04/12 0700  BP: 126/83 146/84 108/71 118/74  Pulse: 88 84 85 83  Temp:  98.2 F (36.8 C) 97.8 F (36.6 C) 97.3 F (36.3 C)  TempSrc:  Oral Oral Oral  Resp: 14 18 18 19   Height:  5\' 3"  (1.6 m)    Weight:  62.914 kg (138 lb 11.2 oz)    SpO2: 98% 98% 96% 92%   No intake or output data in the 24 hours ending 12/04/12 0801 Filed Weights   12/03/12 2224  Weight: 62.914 kg (138 lb 11.2 oz)    Intake/Output from previous day:    General appearance: alert, cooperative and no distress Head: Normocephalic, without obvious abnormality, atraumatic Back: symmetric, no curvature. ROM normal. No CVA tenderness. Resp: clear to auscultation bilaterally Cardio: regular rate and rhythm, S1, S2  normal, no murmur, click, rub or gallop GI: soft, non-tender; bowel sounds normal; no masses,  no organomegaly Extremities: extremities normal, atraumatic, no cyanosis or edema. Able to lift both legs off the bed. Neurologic: Alert. Mildly confused. Cranial nerves intact. Motor strength equal bilaterally. No focal deficits essentially.  Lab Results:  Basic Metabolic Panel:  Recent Labs Lab 12/03/12 1807 12/04/12 0225  NA 138 141  K 3.6 3.0*  CL 101 104  CO2 26 27  GLUCOSE 92 98  BUN 12 14  CREATININE 0.99 1.02  CALCIUM 9.0 8.6   CBC:  Recent Labs Lab 12/03/12 1807 12/04/12 0225  WBC 10.5 10.4  HGB 14.1 13.1  HCT 40.4 39.2  MCV 88.8 89.3  PLT 365 400   Cardiac Enzymes:  Recent Labs Lab 12/03/12 2043 12/04/12 0225  TROPONINI <0.30 <0.30   BNP (last 3 results)  Recent Labs  12/03/12 1807  PROBNP 119.6   Studies/Results: Dg Chest 2 View  12/03/2012  *RADIOLOGY REPORT*  Clinical Data: Chest pain, hypertension.  History of smoking.  CHEST - 2 VIEW  Comparison: 10/20/2012  Findings: Heart size is normal.  There is perihilar peribronchial thickening.  Small hiatal hernia is present.  There are no focal consolidations or pleural effusions.  Degenerative changes are seen in the spine.  IMPRESSION:  1.  Bronchitic changes. 2. No focal pulmonary abnormality.  3.  Small hiatal hernia.   Original Report Authenticated By: Norva Pavlov, M.D.     Medications:  Scheduled: . amLODipine  5 mg Oral q morning - 10a  . aspirin EC  81 mg Oral Daily  . cholecalciferol  1,000 Units Oral q morning - 10a  . donepezil  10 mg Oral QHS  . enoxaparin (LOVENOX) injection  30 mg Subcutaneous Q24H  . fluticasone  2 spray Each Nare Daily  . potassium chloride  40 mEq Oral Once  . senna  1 tablet Oral QODAY  . sodium chloride  3 mL Intravenous Q12H  . venlafaxine XR  75 mg Oral QHS   Continuous:  ZOX:WRUEAV chloride, acetaminophen, acetaminophen, alum & mag hydroxide-simeth,  HYDROmorphone (DILAUDID) injection, loratadine, ondansetron (ZOFRAN) IV, ondansetron, oxyCODONE, sodium chloride, traMADol  Assessment/Plan:  Principal Problem:   Chest pain Active Problems:   Hypertension   Depression   Multiple falls   Dementia    Chest Pain Seems to be ruling out for ACS. Pain had certain concerning features. She had a negative stress test in 2006. Will need further evaluation. This can be pursued as outpatient by PCP as patient is currently stable. She is not on aspirin at home. This will be recommended. She will be given prescription for nitroglycerin tablets as well.   History of HTN Continue Amlodipine. Monitor BP.   Multiple Falls with recent right toe fracture PT/OT consult. Apparently she is to go to either an independent living or assisted living facility next week. Her sisters are wondering if she needs higher level of care. Will see what PT/OT says. Right LE examined and no abnormalities noted. Splint was removed briefly.  Hypokalemia Replete.  Depression Continue Effexor.   Dementia Continue Aricept.   Code Status DNR  DVT Prophylaxis Enoxaparin  Family Communication: No family at bedside.  Disposition Plan: Await PT/OT recommendations. She can likely return to her sister's place later today.     LOS: 1 day   [facility]  [facility] Pager [phone] 12/04/2012, 8:01 AM  If 8PM-8AM, please contact night-coverage at [email or web address], password [facility]

## 2012-12-04 NOTE — Evaluation (Signed)
Physical Therapy Evaluation Patient Details Name: [name] [name] MRN: [id number] DOB: 1929-04-27 Today's Date: 12/04/2012 Time: 4098-1191 PT Time Calculation (min): 26 min  PT Assessment / Plan / Recommendation Clinical Impression  pt presents with CP and hx of falls.  pt is currently staying with sisters and notes planning to move to [place] on Thursday.  Per pt she will be going to ALF, but Nsg states sister says it is Independent Living.  pt not safe for Independent Living.  pt will need ALF of SNF level of care for long term.  Temporarily able to D/C to sister's home with 24hr care then ALF on Thursday.      PT Assessment  Patient needs continued PT services    Follow Up Recommendations  SNF (vs HHPT at ALF.  )    Does the patient have the potential to tolerate intense rehabilitation      Barriers to Discharge None      Equipment Recommendations  None recommended by PT    Recommendations for Other Services     Frequency Min 3X/week    Precautions / Restrictions Precautions Precautions: Fall Precaution Comments: pt has small cast boot from R 5th toe fx per pt and states she is allowed to walk as long as she wears her boot.   Restrictions Weight Bearing Restrictions: No   Pertinent Vitals/Pain Indicates Low back is stiff.        Mobility  Bed Mobility Bed Mobility: Supine to Sit;Sitting - Scoot to Edge of Bed;Sit to Supine Supine to Sit: 5: Supervision;With rails Sitting - Scoot to Edge of Bed: 5: Supervision Sit to Supine: 5: Supervision Details for Bed Mobility Assistance: demos good use of rails.   Transfers Transfers: Sit to Stand;Stand to Sit Sit to Stand: 4: Min guard;With upper extremity assist;From bed;From toilet Stand to Sit: 4: Min guard;With upper extremity assist;To toilet;To bed Details for Transfer Assistance: demos good use of UEs.   Ambulation/Gait Ambulation/Gait Assistance: 4: Min assist Ambulation Distance (Feet): 200 Feet Assistive  device: Rolling walker Ambulation/Gait Assistance Details: pt mildly unsteady with L lateral lean due to R fx boot.  cues and A for positioning in RW and upright posture.   Gait Pattern: Step-through pattern;Decreased stride length;Trunk flexed Stairs: No Wheelchair Mobility Wheelchair Mobility: No    Exercises     PT Diagnosis: Difficulty walking  PT Problem List: Decreased strength;Decreased activity tolerance;Decreased balance;Decreased mobility;Decreased cognition;Decreased knowledge of use of DME;Decreased safety awareness PT Treatment Interventions: DME instruction;Gait training;Stair training;Functional mobility training;Therapeutic activities;Therapeutic exercise;Balance training;Patient/family education   PT Goals Acute Rehab PT Goals PT Goal Formulation: With patient Time For Goal Achievement: 12/18/12 Potential to Achieve Goals: Good Pt will go Supine/Side to Sit: with modified independence PT Goal: Supine/Side to Sit - Progress: Goal set today Pt will go Sit to Supine/Side: with modified independence PT Goal: Sit to Supine/Side - Progress: Goal set today Pt will go Sit to Stand: with modified independence PT Goal: Sit to Stand - Progress: Goal set today Pt will go Stand to Sit: with modified independence PT Goal: Stand to Sit - Progress: Goal set today Pt will Ambulate: >150 feet;with supervision;with rolling walker PT Goal: Ambulate - Progress: Goal set today Pt will Go Up / Down Stairs: 3-5 stairs;with min assist;with rail(s) PT Goal: Up/Down Stairs - Progress: Goal set today  Visit Information  Last PT Received On: 12/04/12 Assistance Needed: +1    Subjective Data  Subjective: I'm supposed to go to ALF on Thursday.  Patient Stated Goal: Home   Prior Functioning  Home Living Lives With:  (Sister) Available Help at Discharge: Family;Available 24 hours/day Type of Home: House Home Access: Stairs to enter [employer] of Steps: 5 Entrance  Stairs-Rails: Left Home Layout: One level Home Adaptive Equipment: Walker - rolling Additional Comments: Per pt she is going to [facility] ALF, but per Nsg pt's sister said that it is Independent Living.   Prior Function Level of Independence: Needs assistance Needs Assistance: Meal Prep;Light Housekeeping;Bathing;Dressing Bath: Moderate Dressing: Minimal (pt notes sister has to A with boot) Meal Prep: Total Light Housekeeping: Total Able to Take Stairs?: Yes Driving: No Vocation: Retired Musician: No difficulties    Copywriter, advertising Overall Cognitive Status: No family/caregiver present to determine baseline cognitive functioning Arousal/Alertness: Awake/alert Orientation Level: Disoriented to;Time Behavior During Session: WFL for tasks performed Cognition - Other Comments: pt with STM deficits and ?safety awareness.      Extremity/Trunk Assessment Right Lower Extremity Assessment RLE ROM/Strength/Tone: Deficits RLE ROM/Strength/Tone Deficits: pt indicates R 5th toe fx and needs to wear small cast boot, otherwise WFL.   RLE Sensation: WFL - Light Touch Left Lower Extremity Assessment LLE ROM/Strength/Tone: WFL for tasks assessed LLE Sensation: WFL - Light Touch Trunk Assessment Trunk Assessment: Kyphotic   Balance Balance Balance Assessed: Yes Static Standing Balance Static Standing - Balance Support: No upper extremity supported;During functional activity Static Standing - Level of Assistance: 5: Stand by assistance Static Standing - Comment/# of Minutes: pt able to stand at sink and leans against counter for support during hand hygiene.    End of Session PT - End of Session Equipment Utilized During Treatment: Gait belt Activity Tolerance: Patient tolerated treatment well Patient left: in bed;with call bell/phone within reach;with bed alarm set Nurse Communication: Mobility status  GP Functional Assessment Tool Used: Clinical  Judgement Functional Limitation: Mobility: Walking and moving around Mobility: Walking and Moving Around Current Status (Z6109): At least 1 percent but less than 20 percent impaired, limited or restricted Mobility: Walking and Moving Around Goal Status [id number]): 0 percent impaired, limited or restricted   Sunny Schlein, [place] [phone] 12/04/2012, 8:50 AM

## 2012-12-06 NOTE — Progress Notes (Signed)
   CARE MANAGEMENT NOTE 12/06/2012  Patient:  [name] [name],[name] [name]   Account Number:  [id number]  Date Initiated:  12/04/2012  Documentation initiated by:  Lanier Clam  Subjective/Objective Assessment:   Admitted w/chest pain.     Action/Plan:   From home   Anticipated DC Date:  12/04/2012   Anticipated DC Plan:  HOME/SELF CARE      DC Planning Services  CM consult      [facility] Choice  HOME HEALTH   Choice offered to / List presented to:  C-5 Sibling        HH arranged  HH-2 PT      HH agency  [facility]   Status of service:  Completed, signed off Medicare Important Message given?   (If response is "NO", the following Medicare IM given date fields will be blank) Date Medicare IM given:   Date Additional Medicare IM given:    Discharge Disposition:  HOME W HOME HEALTH SERVICES  Per UR Regulation:  Reviewed for med. necessity/level of care/duration of stay  If discussed at Long Length of Stay Meetings, dates discussed:    Comments:  12/06/2012 0840 NCM spoke to sister, [name] # [phone]. Offered choice for [facility] and agreeable to [facility]. States her sister is moving to [place] on 12/09/2012. Request therapy start next week. Will notify [facility] rep of referral for [facility] PT. Isidoro Donning RN CCM Case Mgmt phoe [phone]

## 2012-12-13 ENCOUNTER — Encounter (HOSPITAL_COMMUNITY): Payer: Self-pay | Admitting: Emergency Medicine

## 2012-12-13 ENCOUNTER — Emergency Department (HOSPITAL_COMMUNITY)
Admission: EM | Admit: 2012-12-13 | Discharge: 2012-12-13 | Disposition: A | Discharge status: Home / Self Care | Payer: Medicare Other | Attending: Emergency Medicine | Admitting: Emergency Medicine

## 2012-12-13 DIAGNOSIS — M129 Arthropathy, unspecified: Secondary | ICD-10-CM | POA: Insufficient documentation

## 2012-12-13 DIAGNOSIS — Z8719 Personal history of other diseases of the digestive system: Secondary | ICD-10-CM | POA: Insufficient documentation

## 2012-12-13 DIAGNOSIS — IMO0002 Reserved for concepts with insufficient information to code with codable children: Secondary | ICD-10-CM | POA: Insufficient documentation

## 2012-12-13 DIAGNOSIS — F3289 Other specified depressive episodes: Secondary | ICD-10-CM | POA: Insufficient documentation

## 2012-12-13 DIAGNOSIS — Z79899 Other long term (current) drug therapy: Secondary | ICD-10-CM | POA: Insufficient documentation

## 2012-12-13 DIAGNOSIS — Z8739 Personal history of other diseases of the musculoskeletal system and connective tissue: Secondary | ICD-10-CM | POA: Insufficient documentation

## 2012-12-13 DIAGNOSIS — Z862 Personal history of diseases of the blood and blood-forming organs and certain disorders involving the immune mechanism: Secondary | ICD-10-CM | POA: Insufficient documentation

## 2012-12-13 DIAGNOSIS — G43909 Migraine, unspecified, not intractable, without status migrainosus: Secondary | ICD-10-CM | POA: Insufficient documentation

## 2012-12-13 DIAGNOSIS — R51 Headache: Secondary | ICD-10-CM

## 2012-12-13 DIAGNOSIS — R110 Nausea: Secondary | ICD-10-CM

## 2012-12-13 DIAGNOSIS — I10 Essential (primary) hypertension: Secondary | ICD-10-CM | POA: Insufficient documentation

## 2012-12-13 DIAGNOSIS — Z87891 Personal history of nicotine dependence: Secondary | ICD-10-CM | POA: Insufficient documentation

## 2012-12-13 DIAGNOSIS — F329 Major depressive disorder, single episode, unspecified: Secondary | ICD-10-CM | POA: Insufficient documentation

## 2012-12-13 MED ORDER — OXYCODONE-ACETAMINOPHEN 5-325 MG PO TABS
2.0000 | ORAL_TABLET | Freq: Once | ORAL | Status: AC
  Administered 2012-12-13: 2 via ORAL
  Filled 2012-12-13: qty 2

## 2012-12-13 MED ORDER — ONDANSETRON 4 MG PO TBDP
4.0000 mg | ORAL_TABLET | Freq: Once | ORAL | Status: AC
  Administered 2012-12-13: 4 mg via ORAL
  Filled 2012-12-13: qty 1

## 2012-12-13 MED ORDER — DIAZEPAM 5 MG PO TABS
5.0000 mg | ORAL_TABLET | Freq: Once | ORAL | Status: AC
  Administered 2012-12-13: 5 mg via ORAL
  Filled 2012-12-13: qty 1

## 2012-12-13 NOTE — ED Notes (Signed)
Pt comes from [facility] where she c/o migraine and nausea times two days. Pt denies vomiting. Pt has history HTN and anxiety.

## 2012-12-13 NOTE — ED Provider Notes (Signed)
History     83yF with HA. Gradual onset 1.5 days ago. Frontal region. Doesn't lateralize. Relatively constant. No appreciable exacerbating or relieving factors. No fever or chills. No acute visual changes. Nausea. No vomiting. No fever or chills. No neck stiffness. Denies trauma. No intervention prior to arrival.   CSN: [id number]  Arrival date & time 12/13/12  1054   First MD Initiated Contact with Patient 12/13/12 1124      Chief Complaint  Patient presents with  . Migraine  . Nausea    (Consider location/radiation/quality/duration/timing/severity/associated sxs/prior treatment) HPI  Past Medical History  Diagnosis Date  . Hypertension   . Arthritis   . Hiatal hernia     GERD  . Iron deficiency anemia   . DJD (degenerative joint disease)     Hands  . Depression     Past Surgical History  Procedure Laterality Date  . Orif radial fracture  09/2007    Family History  Problem Relation Age of Onset  . Diabetes Father   . Stroke Father   . Atrial fibrillation Mother   . Hypertension Father   . Hypertension Mother   . CAD Maternal Grandfather   . Cancer Paternal Grandmother     History  Substance Use Topics  . Smoking status: Former Smoker -- 1.00 packs/day  . Smokeless tobacco: Not on file  . Alcohol Use: No    OB History   Grav Para Term Preterm Abortions TAB SAB Ect Mult Living                  Review of Systems  All systems reviewed and negative, other than as noted in HPI.   Allergies  Sulfa antibiotics  Home Medications   Current Outpatient Rx  Name  Route  Sig  Dispense  Refill  . amLODipine (NORVASC) 5 MG tablet   Oral   Take 5 mg by mouth every morning.          Marland Kitchen aspirin EC 81 MG EC tablet   Oral   Take 1 tablet (81 mg total) by mouth daily.   30 tablet   1   . cholecalciferol (VITAMIN D) 1000 UNITS tablet   Oral   Take 1,000 Units by mouth every morning.          . donepezil (ARICEPT) 10 MG tablet   Oral   Take 10 mg by  mouth at bedtime.         . fluticasone (FLONASE) 50 MCG/ACT nasal spray   Nasal   Place 2 sprays into the nose daily.         Marland Kitchen loratadine (CLARITIN) 10 MG tablet   Oral   Take 10 mg by mouth daily as needed. For allergies         . nitroGLYCERIN (NITROSTAT) 0.4 MG SL tablet   Sublingual   Place 1 tablet (0.4 mg total) under the tongue every 5 (five) minutes as needed for chest pain.   30 tablet   0   . senna (SENOKOT) 8.6 MG TABS   Oral   Take 1 tablet by mouth every other day.         . traMADol (ULTRAM) 50 MG tablet   Oral   Take 50-100 mg by mouth 2 (two) times daily as needed for pain (pt will take 50 mg in the a.m. and 100 mg in the p.m. if needed for pain).         Marland Kitchen venlafaxine XR (EFFEXOR-XR) 75  MG 24 hr capsule   Oral   Take 75 mg by mouth at bedtime.         . potassium chloride (K-DUR) 10 MEQ tablet   Oral   Take 2 tablets (20 mEq total) by mouth daily. For 5 days   5 tablet   0     BP 116/65  Pulse 79  Temp(Src) 97.6 F (36.4 C) (Oral)  Resp 18  SpO2 90%  Physical Exam  Nursing note and vitals reviewed. Constitutional: She appears well-developed and well-nourished. No distress.  HENT:  Head: Normocephalic and atraumatic.  Eyes: Conjunctivae are normal. Right eye exhibits no discharge. Left eye exhibits no discharge.  Neck: Neck supple.  No nuchal rigidity   Cardiovascular: Normal rate, regular rhythm and normal heart sounds.  Exam reveals no gallop and no friction rub.   No murmur heard. Pulmonary/Chest: Effort normal and breath sounds normal. No respiratory distress.  Abdominal: Soft. She exhibits no distension. There is no tenderness.  Musculoskeletal: She exhibits no edema and no tenderness.  Neurological: She is alert. No cranial nerve deficit. She exhibits normal muscle tone. Coordination normal.  Hard of hearing. No focal motor deficits noted otherwise.   Skin: Skin is warm and dry.  Psychiatric: She has a normal mood and  affect. Her behavior is normal. Thought content normal.    ED Course  Procedures (including critical care time)  Labs Reviewed - No data to display No results found.   1. Headache   2. Nausea       MDM  83yF with headache. Suspect primary HA. Consider emergent secondary causes such as bleed, infectious or mass but doubt. There is no history of trauma. Pt has a nonfocal neurological exam. Afebrile and neck supple. No use of blood thinning medication. Consider ocular etiology such as acute angle closure glaucoma but doubt. Pt denies acute change in visual acuity and eye exam unremarkable. Doubt temporal arteritis given no temporal tenderness and temporal artery pulsations palpable. Doubt CO poisoning. No contacts with similar symptoms. Doubt venous thrombosis. Doubt carotid or vertebral arteries dissection. Symptoms improved with meds. Feel that can be safely discharged, but strict return precautions discussed. Outpt fu.         Raeford Razor, MD 12/13/12 [phone]

## 2012-12-13 NOTE — ED Notes (Signed)
ONG:EX52<WU> Expected date:12/13/12<BR> Expected time:10:48 AM<BR> Means of arrival:Ambulance<BR> Comments:<BR> Nausea/migraine

## 2013-01-13 ENCOUNTER — Other Ambulatory Visit: Payer: Self-pay | Admitting: Family Medicine

## 2013-01-13 ENCOUNTER — Ambulatory Visit
Admission: RE | Admit: 2013-01-13 | Discharge: 2013-01-13 | Disposition: A | Discharge status: Home / Self Care | Payer: Medicare Other | Source: Ambulatory Visit | Attending: Family Medicine | Admitting: Family Medicine

## 2013-01-13 DIAGNOSIS — R0602 Shortness of breath: Secondary | ICD-10-CM

## 2013-01-13 MED ORDER — IOHEXOL 350 MG/ML SOLN
125.0000 mL | Freq: Once | INTRAVENOUS | Status: AC | PRN
  Administered 2013-01-13: 125 mL via INTRAVENOUS

## 2013-05-04 IMAGING — CR DG CHEST 2V
2 series · 2 of 2 positions shown · non-contrast
Comparison: 10/20/2012

CLINICAL DATA: Chest pain, hypertension.  History of smoking.

CHEST - 2 VIEW

[w chest pa]
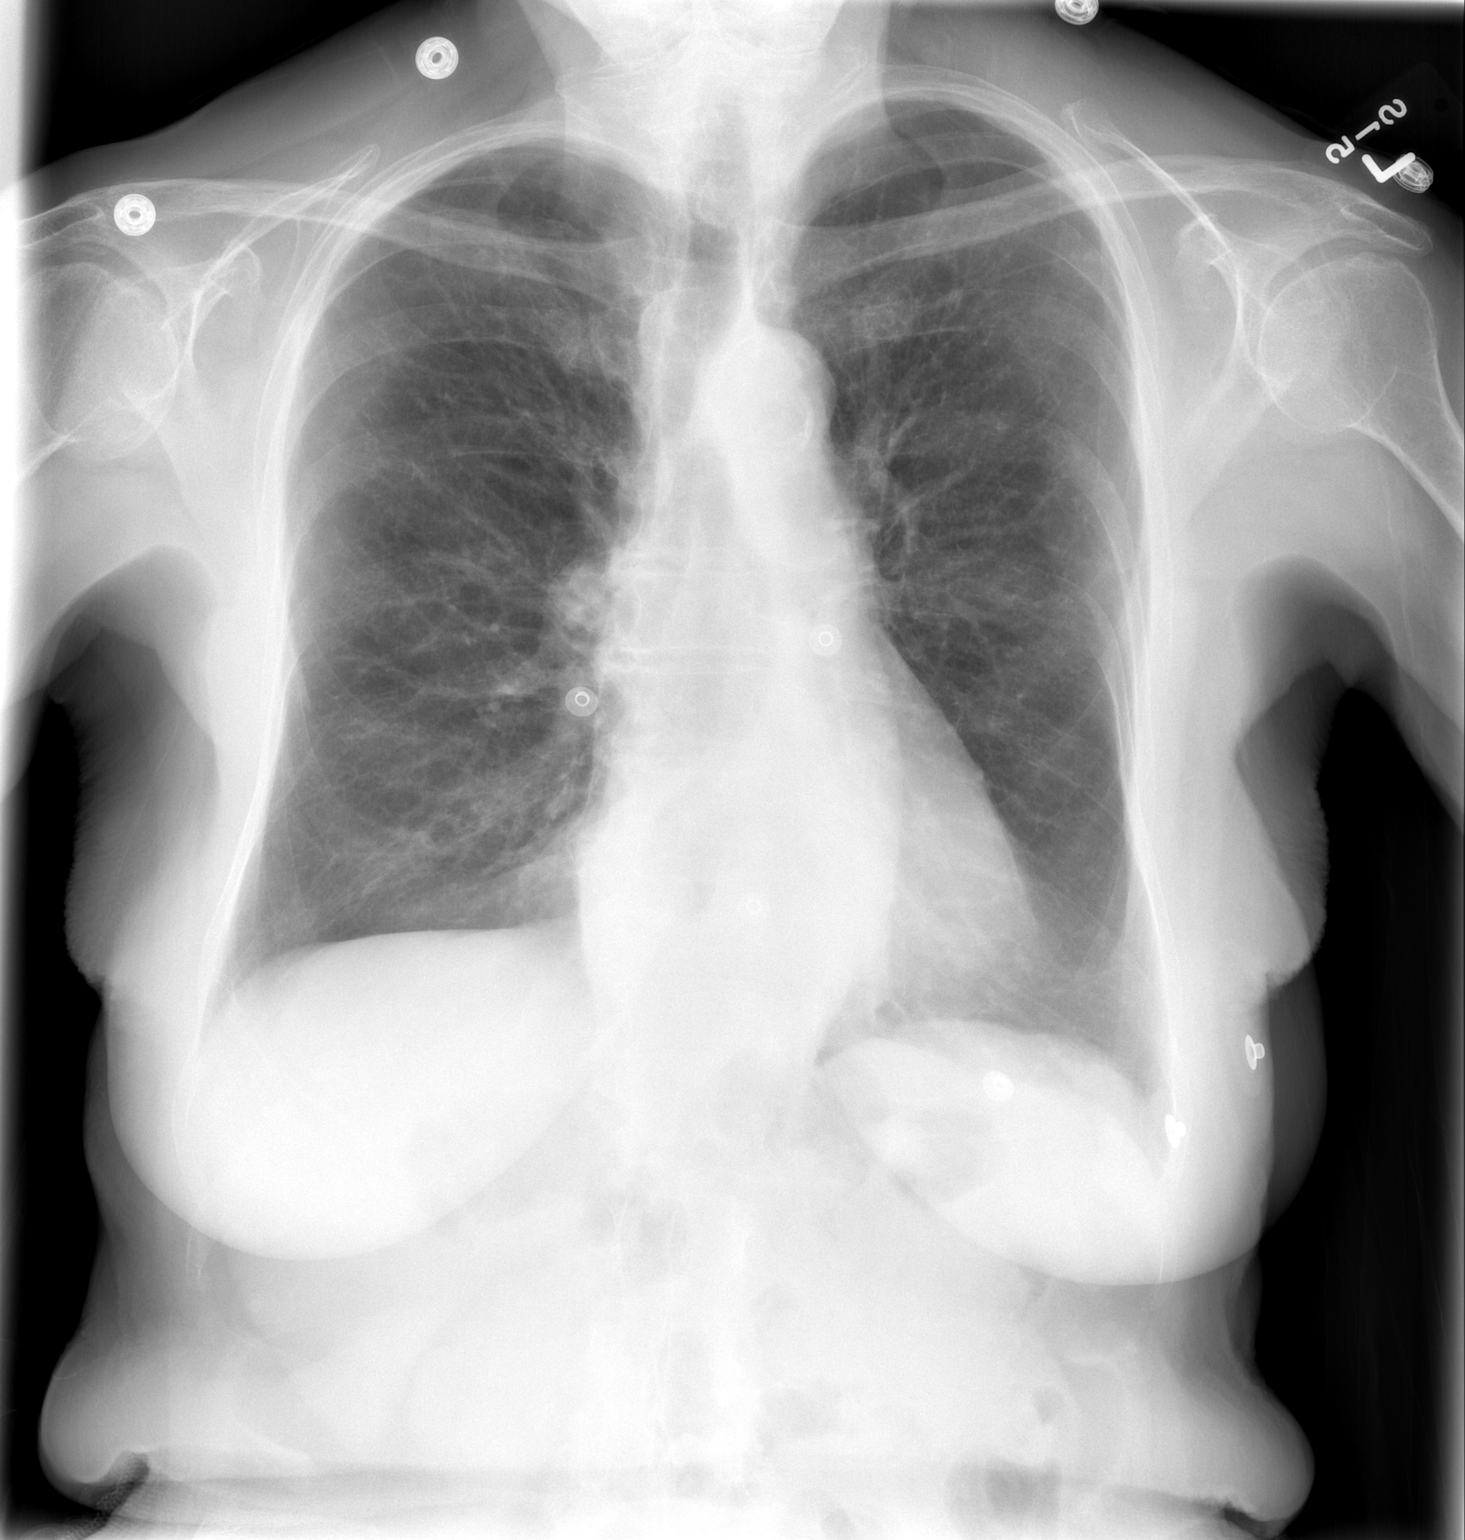

[w chest lat]
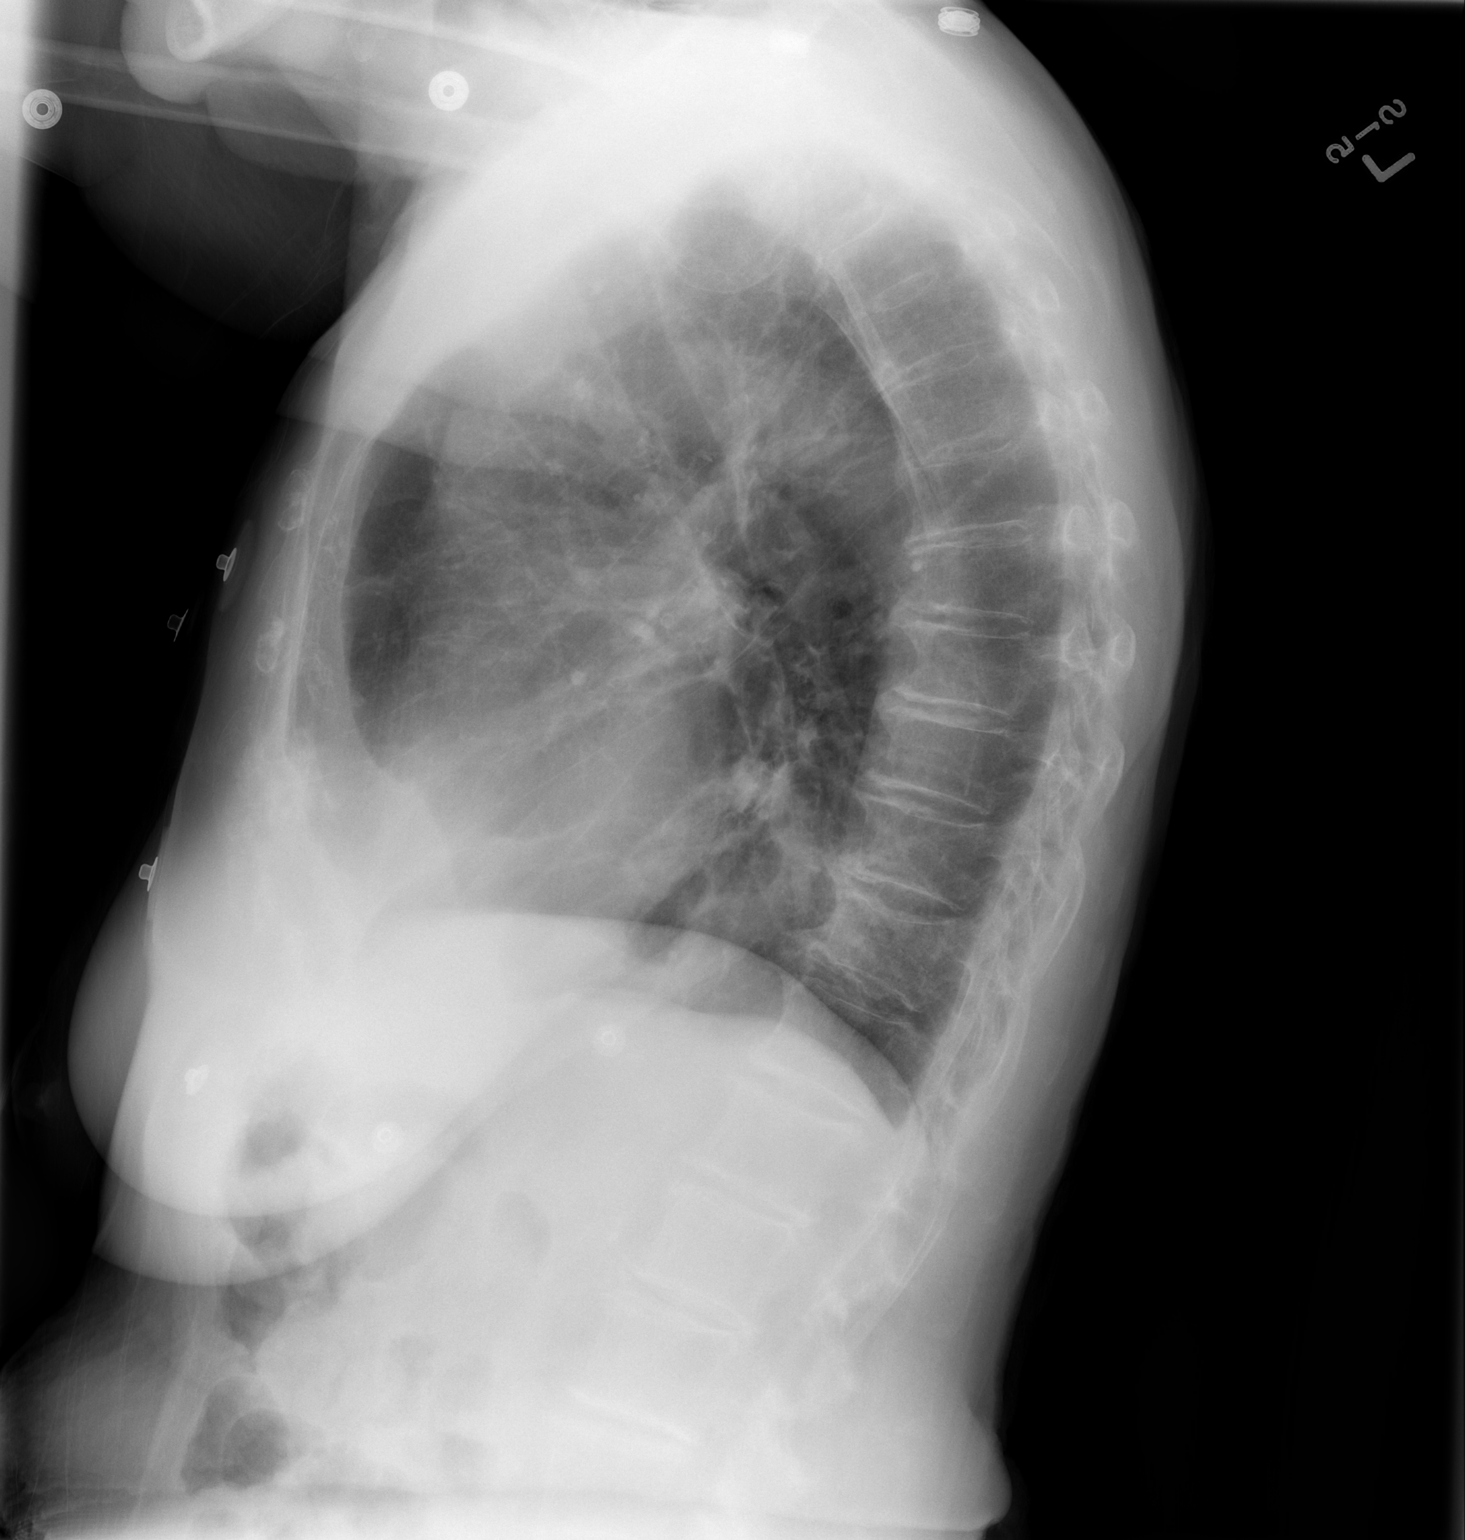

[2 of 2 positions shown; findings below may reference images not displayed]

FINDINGS: Heart size is normal.  There is perihilar peribronchial
thickening.  Small hiatal hernia is present.  There are no focal
consolidations or pleural effusions.  Degenerative changes are seen
in the spine.
IMPRESSION: 1.  Bronchitic changes.
2. No focal pulmonary abnormality.
3.  Small hiatal hernia.

## 2013-05-18 ENCOUNTER — Ambulatory Visit (HOSPITAL_BASED_OUTPATIENT_CLINIC_OR_DEPARTMENT_OTHER)
Admission: RE | Admit: 2013-05-18 | Discharge: 2013-05-18 | Disposition: A | Discharge status: Home / Self Care | Payer: Medicare Other | Source: Ambulatory Visit | Attending: Family Medicine | Admitting: Family Medicine

## 2013-05-18 ENCOUNTER — Other Ambulatory Visit (HOSPITAL_BASED_OUTPATIENT_CLINIC_OR_DEPARTMENT_OTHER): Payer: Self-pay | Admitting: Family Medicine

## 2013-05-18 ENCOUNTER — Other Ambulatory Visit (HOSPITAL_BASED_OUTPATIENT_CLINIC_OR_DEPARTMENT_OTHER): Payer: Self-pay | Admitting: Internal Medicine

## 2013-05-18 DIAGNOSIS — R52 Pain, unspecified: Secondary | ICD-10-CM

## 2013-05-18 DIAGNOSIS — M79609 Pain in unspecified limb: Secondary | ICD-10-CM | POA: Insufficient documentation

## 2013-05-18 DIAGNOSIS — M7989 Other specified soft tissue disorders: Secondary | ICD-10-CM | POA: Insufficient documentation

## 2013-05-23 DIAGNOSIS — K5521 Angiodysplasia of colon with hemorrhage: Secondary | ICD-10-CM

## 2013-05-23 HISTORY — DX: Angiodysplasia of colon with hemorrhage: K55.21

## 2013-06-17 ENCOUNTER — Encounter (HOSPITAL_COMMUNITY): Payer: Self-pay | Admitting: Adult Health

## 2013-06-17 ENCOUNTER — Inpatient Hospital Stay (HOSPITAL_COMMUNITY)
Admission: EM | Admit: 2013-06-17 | Discharge: 2013-06-21 | DRG: 811 | Disposition: A | Discharge status: Home / Self Care | Payer: Medicare Other | Attending: Internal Medicine | Admitting: Internal Medicine

## 2013-06-17 DIAGNOSIS — Z823 Family history of stroke: Secondary | ICD-10-CM

## 2013-06-17 DIAGNOSIS — K2970 Gastritis, unspecified, without bleeding: Secondary | ICD-10-CM | POA: Diagnosis present

## 2013-06-17 DIAGNOSIS — K219 Gastro-esophageal reflux disease without esophagitis: Secondary | ICD-10-CM | POA: Diagnosis present

## 2013-06-17 DIAGNOSIS — R0609 Other forms of dyspnea: Secondary | ICD-10-CM

## 2013-06-17 DIAGNOSIS — K5730 Diverticulosis of large intestine without perforation or abscess without bleeding: Secondary | ICD-10-CM | POA: Diagnosis present

## 2013-06-17 DIAGNOSIS — Z8249 Family history of ischemic heart disease and other diseases of the circulatory system: Secondary | ICD-10-CM

## 2013-06-17 DIAGNOSIS — K5520 Angiodysplasia of colon without hemorrhage: Secondary | ICD-10-CM

## 2013-06-17 DIAGNOSIS — M1990 Unspecified osteoarthritis, unspecified site: Secondary | ICD-10-CM

## 2013-06-17 DIAGNOSIS — K2990 Gastroduodenitis, unspecified, without bleeding: Secondary | ICD-10-CM

## 2013-06-17 DIAGNOSIS — Z87891 Personal history of nicotine dependence: Secondary | ICD-10-CM

## 2013-06-17 DIAGNOSIS — Z79899 Other long term (current) drug therapy: Secondary | ICD-10-CM

## 2013-06-17 DIAGNOSIS — Z7982 Long term (current) use of aspirin: Secondary | ICD-10-CM

## 2013-06-17 DIAGNOSIS — J449 Chronic obstructive pulmonary disease, unspecified: Secondary | ICD-10-CM

## 2013-06-17 DIAGNOSIS — F3289 Other specified depressive episodes: Secondary | ICD-10-CM | POA: Diagnosis present

## 2013-06-17 DIAGNOSIS — K5900 Constipation, unspecified: Secondary | ICD-10-CM | POA: Diagnosis present

## 2013-06-17 DIAGNOSIS — D509 Iron deficiency anemia, unspecified: Secondary | ICD-10-CM

## 2013-06-17 DIAGNOSIS — E669 Obesity, unspecified: Secondary | ICD-10-CM | POA: Diagnosis present

## 2013-06-17 DIAGNOSIS — F329 Major depressive disorder, single episode, unspecified: Secondary | ICD-10-CM | POA: Diagnosis present

## 2013-06-17 DIAGNOSIS — K5731 Diverticulosis of large intestine without perforation or abscess with bleeding: Secondary | ICD-10-CM

## 2013-06-17 DIAGNOSIS — J4489 Other specified chronic obstructive pulmonary disease: Secondary | ICD-10-CM | POA: Diagnosis present

## 2013-06-17 DIAGNOSIS — Z23 Encounter for immunization: Secondary | ICD-10-CM

## 2013-06-17 DIAGNOSIS — Z882 Allergy status to sulfonamides status: Secondary | ICD-10-CM

## 2013-06-17 DIAGNOSIS — D649 Anemia, unspecified: Secondary | ICD-10-CM | POA: Diagnosis present

## 2013-06-17 DIAGNOSIS — M19049 Primary osteoarthritis, unspecified hand: Secondary | ICD-10-CM | POA: Diagnosis present

## 2013-06-17 DIAGNOSIS — I10 Essential (primary) hypertension: Secondary | ICD-10-CM

## 2013-06-17 DIAGNOSIS — R5381 Other malaise: Secondary | ICD-10-CM

## 2013-06-17 DIAGNOSIS — F0390 Unspecified dementia without behavioral disturbance: Secondary | ICD-10-CM

## 2013-06-17 DIAGNOSIS — K5521 Angiodysplasia of colon with hemorrhage: Secondary | ICD-10-CM | POA: Diagnosis present

## 2013-06-17 DIAGNOSIS — K449 Diaphragmatic hernia without obstruction or gangrene: Secondary | ICD-10-CM | POA: Diagnosis present

## 2013-06-17 DIAGNOSIS — Z833 Family history of diabetes mellitus: Secondary | ICD-10-CM

## 2013-06-17 LAB — COMPREHENSIVE METABOLIC PANEL
AST: 14 U/L (ref 0–37)
Albumin: 3.2 g/dL — ABNORMAL LOW (ref 3.5–5.2)
Alkaline Phosphatase: 116 U/L (ref 39–117)
BUN: 18 mg/dL (ref 6–23)
Chloride: 100 mEq/L (ref 96–112)
Potassium: 4 mEq/L (ref 3.5–5.1)
Total Bilirubin: 0.1 mg/dL — ABNORMAL LOW (ref 0.3–1.2)

## 2013-06-17 LAB — CBC
Hemoglobin: 8 g/dL — ABNORMAL LOW (ref 12.0–15.0)
Platelets: 552 10*3/uL — ABNORMAL HIGH (ref 150–400)
RDW: 14.1 % (ref 11.5–15.5)

## 2013-06-17 LAB — RETICULOCYTES: Retic Count, Absolute: 59.8 10*3/uL (ref 19.0–186.0)

## 2013-06-17 LAB — OCCULT BLOOD, POC DEVICE: Fecal Occult Bld: NEGATIVE

## 2013-06-17 LAB — PREPARE RBC (CROSSMATCH)

## 2013-06-17 MED ORDER — NITROGLYCERIN 0.4 MG SL SUBL: 0.4000 mg | SUBLINGUAL_TABLET | SUBLINGUAL | Status: DC | PRN

## 2013-06-17 MED ORDER — VITAMIN D3 25 MCG (1000 UNIT) PO TABS
1000.0000 [IU] | ORAL_TABLET | Freq: Every morning | ORAL | Status: DC
  Administered 2013-06-18 – 2013-06-21 (×3): 1000 [IU] via ORAL
  Filled 2013-06-17 (×4): qty 1

## 2013-06-17 MED ORDER — ACETAMINOPHEN 325 MG PO TABS
650.0000 mg | ORAL_TABLET | Freq: Four times a day (QID) | ORAL | Status: DC | PRN
  Administered 2013-06-19 – 2013-06-20 (×2): 650 mg via ORAL
  Filled 2013-06-17 (×2): qty 2

## 2013-06-17 MED ORDER — ONDANSETRON HCL 4 MG PO TABS: 4.0000 mg | ORAL_TABLET | Freq: Four times a day (QID) | ORAL | Status: DC | PRN

## 2013-06-17 MED ORDER — SODIUM CHLORIDE 0.9 % IV SOLN
INTRAVENOUS | Status: DC
  Administered 2013-06-18 – 2013-06-20 (×5): via INTRAVENOUS

## 2013-06-17 MED ORDER — ONDANSETRON HCL 4 MG/2ML IJ SOLN: 4.0000 mg | Freq: Four times a day (QID) | INTRAMUSCULAR | Status: DC | PRN

## 2013-06-17 MED ORDER — OXYCODONE HCL 5 MG PO TABS
5.0000 mg | ORAL_TABLET | ORAL | Status: DC | PRN
  Filled 2013-06-17: qty 1

## 2013-06-17 MED ORDER — VENLAFAXINE HCL ER 75 MG PO CP24
75.0000 mg | ORAL_CAPSULE | Freq: Every day | ORAL | Status: DC
  Administered 2013-06-18 – 2013-06-20 (×3): 75 mg via ORAL
  Filled 2013-06-17 (×5): qty 1

## 2013-06-17 MED ORDER — ALUM & MAG HYDROXIDE-SIMETH 200-200-20 MG/5ML PO SUSP: 30.0000 mL | Freq: Four times a day (QID) | ORAL | Status: DC | PRN

## 2013-06-17 MED ORDER — LORATADINE 10 MG PO TABS
10.0000 mg | ORAL_TABLET | Freq: Every day | ORAL | Status: DC | PRN
  Filled 2013-06-17: qty 1

## 2013-06-17 MED ORDER — ACETAMINOPHEN 650 MG RE SUPP: 650.0000 mg | Freq: Four times a day (QID) | RECTAL | Status: DC | PRN

## 2013-06-17 MED ORDER — PANTOPRAZOLE SODIUM 40 MG IV SOLR
40.0000 mg | Freq: Two times a day (BID) | INTRAVENOUS | Status: DC
  Administered 2013-06-18 – 2013-06-19 (×4): 40 mg via INTRAVENOUS
  Filled 2013-06-17 (×8): qty 40

## 2013-06-17 MED ORDER — AMLODIPINE BESYLATE 5 MG PO TABS
5.0000 mg | ORAL_TABLET | Freq: Every morning | ORAL | Status: DC
  Administered 2013-06-18 – 2013-06-21 (×3): 5 mg via ORAL
  Filled 2013-06-17 (×4): qty 1

## 2013-06-17 MED ORDER — SENNA 8.6 MG PO TABS
1.0000 | ORAL_TABLET | ORAL | Status: DC
  Administered 2013-06-19 – 2013-06-21 (×2): 8.6 mg via ORAL
  Filled 2013-06-17 (×2): qty 1

## 2013-06-17 MED ORDER — HYDROMORPHONE HCL PF 1 MG/ML IJ SOLN
0.5000 mg | INTRAMUSCULAR | Status: DC | PRN
  Filled 2013-06-17: qty 1

## 2013-06-17 MED ORDER — TIOTROPIUM BROMIDE MONOHYDRATE 18 MCG IN CAPS
18.0000 ug | ORAL_CAPSULE | Freq: Every day | RESPIRATORY_TRACT | Status: DC
  Administered 2013-06-18 – 2013-06-21 (×4): 18 ug via RESPIRATORY_TRACT
  Filled 2013-06-17 (×2): qty 5

## 2013-06-17 MED ORDER — DONEPEZIL HCL 10 MG PO TABS
10.0000 mg | ORAL_TABLET | Freq: Every day | ORAL | Status: DC
  Administered 2013-06-18 – 2013-06-20 (×3): 10 mg via ORAL
  Filled 2013-06-17 (×7): qty 1

## 2013-06-17 MED ORDER — ZOLPIDEM TARTRATE 5 MG PO TABS
5.0000 mg | ORAL_TABLET | Freq: Every evening | ORAL | Status: DC | PRN
  Administered 2013-06-20: 5 mg via ORAL
  Filled 2013-06-17: qty 1

## 2013-06-17 NOTE — ED Notes (Signed)
Pt undressing self, getting into gown, Dr. Anitra Lauth at [facility] speaking with pt and family, pt steady on her feet, calm, NAD.

## 2013-06-17 NOTE — ED Notes (Signed)
This note also relates to the following rows which could not be included: Rate - Cannot attach notes to extension rows Line - Cannot attach notes to extension rows  Blood was started at 1147

## 2013-06-17 NOTE — ED Provider Notes (Signed)
CSN: [id number]     Arrival date & time 06/17/13  1806 History   First MD Initiated Contact with Patient 06/17/13 2215     Chief Complaint  Patient presents with  . Abnormal Lab   (Consider location/radiation/quality/duration/timing/severity/associated sxs/prior Treatment) HPI Comments: Patient told to come to the emergency room today after seeing her doctor for routine blood work before catheterization next week and found to be anemic with a hemoglobin of 7.5. Patient states that for the last 3 weeks to 1 month she's had worsening fatigue and shortness of breath with exertion. She also has noticed looking very pale and is going to see her doctor about it but she states they did not draw any blood work. She denies any dark or tarry stools and does not take any anti-coagulation except for 81 mg aspirin. She denies chest pain or syncope. She does have a history of iron deficiency anemia but no history of acute blood loss. Patient's last hemoglobin was checked in March and at that time was 13.  The history is provided by the patient.    Past Medical History  Diagnosis Date  . Hypertension   . Arthritis   . Hiatal hernia     GERD  . Iron deficiency anemia   . DJD (degenerative joint disease)     Hands  . Depression    Past Surgical History  Procedure Laterality Date  . Orif radial fracture  09/2007   Family History  Problem Relation Age of Onset  . Diabetes Father   . Stroke Father   . Atrial fibrillation Mother   . Hypertension Father   . Hypertension Mother   . CAD Maternal Grandfather   . Cancer Paternal Grandmother    History  Substance Use Topics  . Smoking status: Former Smoker -- 1.00 packs/day  . Smokeless tobacco: Not on file  . Alcohol Use: No   OB History   Grav Para Term Preterm Abortions TAB SAB Ect Mult Living                 Review of Systems  Constitutional: Positive for fatigue.  Respiratory: Positive for shortness of breath. Negative for chest  tightness.   Cardiovascular: Negative for chest pain.  Neurological: Negative for syncope.  All other systems reviewed and are negative.    Allergies  Sulfa antibiotics  Home Medications   Current Outpatient Rx  Name  Route  Sig  Dispense  Refill  . amLODipine (NORVASC) 5 MG tablet   Oral   Take 5 mg by mouth every morning.          Marland Kitchen aspirin EC 81 MG EC tablet   Oral   Take 1 tablet (81 mg total) by mouth daily.   30 tablet   1   . cholecalciferol (VITAMIN D) 1000 UNITS tablet   Oral   Take 1,000 Units by mouth every morning.          . donepezil (ARICEPT) 10 MG tablet   Oral   Take 10 mg by mouth at bedtime.         . fluticasone (FLONASE) 50 MCG/ACT nasal spray   Nasal   Place 2 sprays into the nose daily.         . Fluticasone-Salmeterol (ADVAIR) 500-50 MCG/DOSE AEPB   Inhalation   Inhale 1 puff into the lungs every 12 (twelve) hours.         Marland Kitchen loratadine (CLARITIN) 10 MG tablet   Oral  Take 10 mg by mouth daily as needed. For allergies         . loratadine (CLARITIN) 10 MG tablet   Oral   Take 10 mg by mouth daily.         . nitroGLYCERIN (NITROSTAT) 0.4 MG SL tablet   Sublingual   Place 1 tablet (0.4 mg total) under the tongue every 5 (five) minutes as needed for chest pain.   30 tablet   0   . pantoprazole (PROTONIX) 40 MG tablet   Oral   Take 40 mg by mouth daily.         Marland Kitchen senna (SENOKOT) 8.6 MG TABS   Oral   Take 1 tablet by mouth every other day.         . tiotropium (SPIRIVA) 18 MCG inhalation capsule   Inhalation   Place 18 mcg into inhaler and inhale daily.         . traMADol (ULTRAM) 50 MG tablet   Oral   Take 50-100 mg by mouth 2 (two) times daily as needed for pain (pt will take 50 mg in the a.m. and 100 mg in the p.m. if needed for pain).         Marland Kitchen venlafaxine XR (EFFEXOR-XR) 75 MG 24 hr capsule   Oral   Take 75 mg by mouth at bedtime.          BP 136/64  Pulse 90  Temp(Src) 99.2 F (37.3 C)  (Oral)  Resp 14  SpO2 98% Physical Exam  Nursing note and vitals reviewed. Constitutional: She is oriented to person, place, and time. She appears well-developed and well-nourished. No distress.  HENT:  Head: Normocephalic and atraumatic.  Mouth/Throat: Oropharynx is clear and moist.  Eyes: EOM are normal. Pupils are equal, round, and reactive to light.  Pale conjunctiva  Neck: Normal range of motion. Neck supple.  Cardiovascular: Normal rate, regular rhythm and intact distal pulses.   No murmur heard. Pulmonary/Chest: Effort normal and breath sounds normal. No respiratory distress. She has no wheezes. She has no rales.  Abdominal: Soft. She exhibits no distension. There is no tenderness. There is no rebound and no guarding.  Musculoskeletal: Normal range of motion. She exhibits no edema and no tenderness.  Neurological: She is alert and oriented to person, place, and time.  Skin: Skin is warm and dry. No rash noted. No erythema. There is pallor.  Psychiatric: She has a normal mood and affect. Her behavior is normal.    ED Course  Procedures (including critical care time) Labs Review Labs Reviewed  CBC - Abnormal; Notable for the following:    RBC 3.35 (*)    Hemoglobin 8.0 (*)    HCT 26.3 (*)    MCH 23.9 (*)    Platelets 552 (*)    All other components within normal limits  COMPREHENSIVE METABOLIC PANEL - Abnormal; Notable for the following:    Glucose, Bld 111 (*)    Calcium 8.2 (*)    Albumin 3.2 (*)    Total Bilirubin 0.1 (*)    GFR calc non Af Amer 45 (*)    GFR calc Af Amer 53 (*)    All other components within normal limits  GLUCOSE, CAPILLARY - Abnormal; Notable for the following:    Glucose-Capillary 122 (*)    All other components within normal limits  OCCULT BLOOD X 1 CARD TO LAB, STOOL  OCCULT BLOOD, POC DEVICE  TYPE AND SCREEN  PREPARE RBC (CROSSMATCH)  Imaging Review No results found.  MDM  No diagnosis found.  The patient came in with new onset  anemia. She states for the last several weeks to one month she has been very fatigued, short of breath and cannot do anything.  She was seen by cardiology and had an echo that was abnormal and was going to be getting a catheterization next week so went to her doctor for routine blood work was called today with a hemoglobin of 7.5. Repeat here was 8 and is consistent with patient's symptoms. Patient was Hemoccult negative so unclear what is causing her anemia at this time.  We'll transfuse 2 units and admit for further evaluation.    Gwyneth Sprout, MD 06/17/13 2303

## 2013-06-17 NOTE — ED Notes (Addendum)
Pt not in room brought back to room via w/c and taken to b/r, alert, NAD, calm, interactive, speaking in clear complete sentences, resps e/u, steady on feet from w/c to commode, son remains in room, Hgb noted to be 8.0 down from 13 6 months ago, blood consent and stool card at [facility].

## 2013-06-17 NOTE — ED Notes (Addendum)
Presents with Hgb of 7, sent from [facility] physicians. Over the past few months pt reports weakness and pallor, SOB, dark stools. Denies frank blood in stool . Pt is pale, alert and oriented. Denies pain.   Takes ASA, denies use of blood thinners

## 2013-06-17 NOTE — ED Notes (Signed)
Nurse First Rounds : Nurse explained delay , process and wait time . Respirations unlabored / No pain at this time.

## 2013-06-17 NOTE — ED Notes (Signed)
Blood Consent signed by patient, son present. NT at [facility] obtaining orthostatic VS.

## 2013-06-18 ENCOUNTER — Encounter (HOSPITAL_COMMUNITY): Payer: Self-pay | Admitting: *Deleted

## 2013-06-18 DIAGNOSIS — R0609 Other forms of dyspnea: Secondary | ICD-10-CM | POA: Diagnosis present

## 2013-06-18 DIAGNOSIS — J449 Chronic obstructive pulmonary disease, unspecified: Secondary | ICD-10-CM | POA: Diagnosis present

## 2013-06-18 DIAGNOSIS — R5381 Other malaise: Secondary | ICD-10-CM | POA: Diagnosis present

## 2013-06-18 LAB — IRON AND TIBC
Iron: 16 ug/dL — ABNORMAL LOW (ref 42–135)
Saturation Ratios: 3 % — ABNORMAL LOW (ref 20–55)
TIBC: 469 ug/dL (ref 250–470)
UIBC: 453 ug/dL — ABNORMAL HIGH (ref 125–400)

## 2013-06-18 LAB — HEMOGLOBIN AND HEMATOCRIT, BLOOD
HCT: 31.5 % — ABNORMAL LOW (ref 36.0–46.0)
Hemoglobin: 10.4 g/dL — ABNORMAL LOW (ref 12.0–15.0)

## 2013-06-18 LAB — BASIC METABOLIC PANEL
CO2: 27 mEq/L (ref 19–32)
Calcium: 8.3 mg/dL — ABNORMAL LOW (ref 8.4–10.5)
GFR calc Af Amer: 64 mL/min — ABNORMAL LOW (ref >90)
GFR calc non Af Amer: 55 mL/min — ABNORMAL LOW (ref >90)
Glucose, Bld: 101 mg/dL — ABNORMAL HIGH (ref 70–99)
Sodium: 139 mEq/L (ref 135–145)

## 2013-06-18 LAB — CBC
Hemoglobin: 10.5 g/dL — ABNORMAL LOW (ref 12.0–15.0)
MCH: 25.5 pg — ABNORMAL LOW (ref 26.0–34.0)
Platelets: 448 10*3/uL — ABNORMAL HIGH (ref 150–400)
RBC: 4.12 MIL/uL (ref 3.87–5.11)
RDW: 14.1 % (ref 11.5–15.5)

## 2013-06-18 LAB — FERRITIN: Ferritin: 5 ng/mL — ABNORMAL LOW (ref 10–291)

## 2013-06-18 MED ORDER — PNEUMOCOCCAL VAC POLYVALENT 25 MCG/0.5ML IJ INJ
0.5000 mL | INJECTION | INTRAMUSCULAR | Status: AC
  Administered 2013-06-19: 0.5 mL via INTRAMUSCULAR
  Filled 2013-06-18: qty 0.5

## 2013-06-18 NOTE — Progress Notes (Signed)
[facility] PROGRESS NOTE  [name] [name] QIO:[id number] DOB: Apr 27, 1929 DOA: 06/17/2013 PCP: Gretel Acre, MD  Assessment/Plan: 1. Fatigue/DOE; counseled patient that most likely secondary to her profound anemia. -Contacted Dr.Aitesbaomo (cardiology) and he agreed that patient should have colonoscopy prior to any scheduled catheterization. -Patient will need to reschedule cardiac catheterization after discharge  2.  HTN; currently within [facility] guidelines. Continue current regimen  3. GI bleed; patient has never seen a gastroenterologist placed a call to Dr. Loreta Ave (gastroenterologist on-call) who has agreed to see patient.  4. Anemia; appears to be multifactorial; GI bleed? -Patient is severely iron deficient, off on repleting iron until GI evaluates in the a.m.  5. Dementia; continue home regimen  6. COPD; continue home regimen  Code Status: Full Family Communication: Spoke with Derrica Sieg (son) [phone]  Disposition Plan:    Consultants:  Gastroenterology (Dr. Loreta Ave)    Procedures:  EKG following repletion of PRBC 03/18/2013 Compared to EKG from 12/03/2012 no significant change ; NSR, RBBB    Antibiotics:    HPI/Subjective: [name] [name] is a 79 y.o. female PMHx HTN, dementia, anemia, hypocalcemia, depression who presents to the ED with complaints of severe SOB weakness and fatigue X 3 weeks. She had been scheduled for cardiac stress test and cardiac evaluation (cardiac catheterization) next week by Dr. Mayford Knife [facility] cardiology) and was being seen by the cardiologist today`and had blood work which revealed a hemoglobin level of 7.6, and her previous hemoglobin in 12/04/12 was 13.1 She was referred to the ED for symptomatic anemia. She denies having any hematemesis, hematochezia, or melena. She denies any ABD pain. In her remote history she reports having iron deficiency anemia many years ago and had to have Iron therapy. She does not recall having a GI workup. A  rectal examination was performed by the EDP and was found to be HEME negative. She was referred for medical admission. Transfused 2 units PRBC current hemoglobin= 10.4. Currently patient states feels greatly improved, negative fatigue, negative DOE. Patient's son states approximately 20 years ago patient was suffering from iron deficiency anemia. Patient states negative melena/hematochezia.   Objective: Filed Vitals:   06/18/13 0600 06/18/13 0700 06/18/13 1000 06/18/13 1354  BP: 135/65 130/62 118/62 126/63  Pulse: 94 88 85 89  Temp: 98.4 F (36.9 C) 98.9 F (37.2 C) 98.7 F (37.1 C) 98.5 F (36.9 C)  TempSrc:  Oral Oral Oral  Resp: 20 20 20 18   Height:      Weight:      SpO2: 98% 98% 97% 100%    Intake/Output Summary (Last 24 hours) at 06/18/13 1729 Last data filed at 06/18/13 1400  Gross per 24 hour  Intake 2742.5 ml  Output    800 ml  Net 1942.5 ml   Filed Weights   06/18/13 0458  Weight: 68.1 kg (150 lb 2.1 oz)    Exam:   General:  A./O. x4,NAD  Cardiovascular: Regular rhythm and rate, negative murmurs rubs or gallops, DP/PT one plus bilateral  Respiratory: Clear to auscultation bilateral  Abdomen: Soft nontender nondistended plus bowel sounds  Musculoskeletal: Negative pedal edema bilateral   Data Reviewed: Basic Metabolic Panel:  Recent Labs Lab 06/17/13 1904 06/18/13 0755  NA 137 139  K 4.0 4.4  CL 100 104  CO2 25 27  GLUCOSE 111* 101*  BUN 18 16  CREATININE 1.10 0.94  CALCIUM 8.2* 8.3*   Liver Function Tests:  Recent Labs Lab 06/17/13 1904  AST 14  ALT 15  ALKPHOS 116  BILITOT 0.1*  PROT 7.0  ALBUMIN 3.2*   No results found for this basename: LIPASE, AMYLASE,  in the last 168 hours No results found for this basename: AMMONIA,  in the last 168 hours CBC:  Recent Labs Lab 06/17/13 1904 06/17/13 2329 06/18/13 0755 06/18/13 1430  WBC 9.8  --  9.6  --   HGB 8.0* 8.1* 10.5* 10.4*  HCT 26.3* 25.9* 32.8* 31.5*  MCV 78.5  --  79.6   --   PLT 552*  --  448*  --    Cardiac Enzymes: No results found for this basename: CKTOTAL, CKMB, CKMBINDEX, TROPONINI,  in the last 168 hours BNP (last 3 results)  Recent Labs  12/03/12 1807  PROBNP 119.6   CBG:  Recent Labs Lab 06/17/13 1841  GLUCAP 122*    No results found for this or any previous visit (from the past 240 hour(s)).   Studies: No results found.  Scheduled Meds: . amLODipine  5 mg Oral q morning - 10a  . cholecalciferol  1,000 Units Oral q morning - 10a  . donepezil  10 mg Oral QHS  . pantoprazole (PROTONIX) IV  40 mg Intravenous Q12H  . [START ON 06/19/2013] pneumococcal 23 valent vaccine  0.5 mL Intramuscular Tomorrow-1000  . [START ON 06/19/2013] senna  1 tablet Oral QODAY  . tiotropium  18 mcg Inhalation Daily  . venlafaxine XR  75 mg Oral QHS   Continuous Infusions: . sodium chloride 75 mL/hr at 06/18/13 0830    Principal Problem:   Anemia Active Problems:   Hypertension   Dementia   Hypocalcemia    Time spent:  50 minutes   Shaunn Tackitt, J  [facility] Pager [phone]. If 7PM-7AM, please contact night-coverage at [email or web address], password [email or web address] 06/18/2013, 5:29 PM  LOS: 1 day

## 2013-06-18 NOTE — H&P (Signed)
[facility] History and Physical  [name] [name]:[id number] DOB: 03-02-1929 DOA: 06/17/2013  Referring physician: EDP PCP: Gretel Acre, MD  Specialists:   Chief Complaint: SOB and Fatigue  HPI: [name] [name] is a 79 y.o. female who presents to the ED with complaints of severe SOB weakness and fatigue X 3 weeks.   She had been sent for a cardiac stress test  And cardiac evaluation and was being seen by the cardiologist today and had blood work which revealed a hemoglobin level of 7.6, and her previous hemoglobin in 09/2012 was 13.   She was referred to the ED for symptomatic anemia.   She denies having any hematemesis, hematochezia, or melena.   She denies any ABD pain.   In her remote history she reports having iron deficiency anemia many years ago and had to have Iron therapy.   She does not recall having a GI workup.   A rectal examination was performed by the EDP and was found to be HEME negative.  She was referred for medical admission.      Review of Systems: The patient denies anorexia, fever, chills, headaches, weight loss,, vision loss, diplopia, dizziness, decreased hearing, rhinitis, hoarseness, chest pain, syncope, peripheral edema, balance deficits, cough, hemoptysis, abdominal pain, nausea, vomiting, diarrhea, constipation, hematemesis, melena, hematochezia, severe indigestion/heartburn, dysuria, hematuria, incontinence, muscle weakness, suspicious skin lesions, transient blindness, difficulty walking, depression, unusual weight change, abnormal bleeding, enlarged lymph nodes, angioedema, and breast masses.    Past Medical History  Diagnosis Date  . Hypertension   . Arthritis   . Hiatal hernia     GERD  . Iron deficiency anemia   . DJD (degenerative joint disease)     Hands  . Depression     Past Surgical History  Procedure Laterality Date  . Orif radial fracture  09/2007    Prior to Admission medications   Medication Sig Start Date End Date Taking?  Authorizing Provider  amLODipine (NORVASC) 5 MG tablet Take 5 mg by mouth every morning.    Yes Historical Provider, MD  aspirin EC 81 MG EC tablet Take 1 tablet (81 mg total) by mouth daily. 12/04/12  Yes Osvaldo Shipper, MD  cholecalciferol (VITAMIN D) 1000 UNITS tablet Take 1,000 Units by mouth every morning.    Yes Historical Provider, MD  donepezil (ARICEPT) 10 MG tablet Take 10 mg by mouth at bedtime.   Yes Historical Provider, MD  fluticasone (FLONASE) 50 MCG/ACT nasal spray Place 2 sprays into the nose daily.   Yes Historical Provider, MD  Fluticasone-Salmeterol (ADVAIR) 500-50 MCG/DOSE AEPB Inhale 1 puff into the lungs every 12 (twelve) hours.   Yes Historical Provider, MD  loratadine (CLARITIN) 10 MG tablet Take 10 mg by mouth daily as needed. For allergies   Yes Historical Provider, MD  loratadine (CLARITIN) 10 MG tablet Take 10 mg by mouth daily.   Yes Historical Provider, MD  nitroGLYCERIN (NITROSTAT) 0.4 MG SL tablet Place 1 tablet (0.4 mg total) under the tongue every 5 (five) minutes as needed for chest pain. 12/04/12  Yes Osvaldo Shipper, MD  pantoprazole (PROTONIX) 40 MG tablet Take 40 mg by mouth daily.   Yes Historical Provider, MD  senna (SENOKOT) 8.6 MG TABS Take 1 tablet by mouth every other day.   Yes Historical Provider, MD  tiotropium (SPIRIVA) 18 MCG inhalation capsule Place 18 mcg into inhaler and inhale daily.   Yes Historical Provider, MD  traMADol (ULTRAM) 50 MG tablet Take 50-100 mg by mouth 2 (two)  times daily as needed for pain (pt will take 50 mg in the a.m. and 100 mg in the p.m. if needed for pain).   Yes Historical Provider, MD  venlafaxine XR (EFFEXOR-XR) 75 MG 24 hr capsule Take 75 mg by mouth at bedtime.   Yes Historical Provider, MD    Allergies  Allergen Reactions  . Sulfa Antibiotics Itching    Social History:  reports that she has quit smoking. She does not have any smokeless tobacco history on file. She reports that she does not drink alcohol or use  illicit drugs.     Family History  Problem Relation Age of Onset  . Diabetes Father   . Stroke Father   . Atrial fibrillation Mother   . Hypertension Father   . Hypertension Mother   . CAD Maternal Grandfather   . Cancer Paternal Grandmother      Physical Exam:  GEN:  Pleasant Elderly pale Obese  79 y.o. Caucasian female  examined  and in no acute distress; cooperative with exam Filed Vitals:   06/17/13 2259 06/17/13 2300 06/17/13 2315 06/17/13 2330  BP: 130/64 130/64 123/60 121/55  Pulse: 100 101 84 84  Temp:    97.9 F (36.6 C)  TempSrc:    Oral  Resp:      SpO2:  98% 98% 100%   Blood pressure 121/55, pulse 84, temperature 97.9 F (36.6 C), temperature source Oral, resp. rate 14, SpO2 100.00%. PSYCH: She is alert and oriented x4; does not appear anxious does not appear depressed; affect is normal HEENT: Normocephalic and Atraumatic, Mucous membranes pink; PERRLA; EOM intact; Fundi:  Benign;  No scleral icterus, Nares: Patent, Oropharynx: Clear, Fair Dentition, Neck:  FROM, no cervical lymphadenopathy nor thyromegaly or carotid bruit; no JVD; Breasts:: Not examined CHEST WALL: No tenderness CHEST: Normal respiration, clear to auscultation bilaterally HEART: Regular rate and rhythm; no murmurs rubs or gallops BACK: No kyphosis or scoliosis; no CVA tenderness ABDOMEN: Positive Bowel Sounds, Obese, soft non-tender; no masses, no organomegaly, no pannus; no intertriginous candida. Rectal Exam: Not done EXTREMITIES: No cyanosis, clubbing or edema; no ulcerations. Genitalia: not examined PULSES: 2+ and symmetric SKIN: Normal hydration no rash or ulceration CNS: Cranial nerves 2-12 grossly intact no focal neurologic deficit    Labs on Admission:  Basic Metabolic Panel:  Recent Labs Lab 06/17/13 1904  NA 137  K 4.0  CL 100  CO2 25  GLUCOSE 111*  BUN 18  CREATININE 1.10  CALCIUM 8.2*   Liver Function Tests:  Recent Labs Lab 06/17/13 1904  AST 14  ALT 15   ALKPHOS 116  BILITOT 0.1*  PROT 7.0  ALBUMIN 3.2*   No results found for this basename: LIPASE, AMYLASE,  in the last 168 hours No results found for this basename: AMMONIA,  in the last 168 hours CBC:  Recent Labs Lab 06/17/13 1904 06/17/13 2329  WBC 9.8  --   HGB 8.0* 8.1*  HCT 26.3* 25.9*  MCV 78.5  --   PLT 552*  --    Cardiac Enzymes: No results found for this basename: CKTOTAL, CKMB, CKMBINDEX, TROPONINI,  in the last 168 hours  BNP (last 3 results)  Recent Labs  12/03/12 1807  PROBNP 119.6   CBG:  Recent Labs Lab 06/17/13 1841  GLUCAP 122*    Radiological Exams on Admission: No results found.     Assessment/Plan Principal Problem:   Anemia Active Problems:   Hypertension   Dementia   Hypocalcemia  1.   Anemia-  Microcytic indices, check anemia panel, and monitor H/Hs q 8 hours, and FOBT daily X 3.   IV Protonix, and Transfuse 1 unit PRBCs.  Discontinue ASA Rx for now.    2.   Hypertension-  Continue Amlodipine,  IV Hydralazine PRN.    3.   Hypocalcemia-  Replete and monitor.    4.  Dementia-  Continue Aricept Rx.    5.  SCDs for DVT prophylaxis.          Code Status:    FULL CODE Family Communication:   Son At Bedside  Disposition Plan:       Inpatient  Time spent:  48 Minutes  Ron Parker [facility] Pager [phone]  If 7PM-7AM, please contact night-coverage [email or web address] Password [email or web address] 06/18/2013, 12:00 AM

## 2013-06-19 ENCOUNTER — Encounter (HOSPITAL_COMMUNITY): Payer: Self-pay | Admitting: *Deleted

## 2013-06-19 ENCOUNTER — Encounter (HOSPITAL_COMMUNITY)
Admission: EM | Disposition: A | Discharge status: Home / Self Care | Payer: Self-pay | Source: Home / Self Care | Attending: Internal Medicine

## 2013-06-19 DIAGNOSIS — K2970 Gastritis, unspecified, without bleeding: Secondary | ICD-10-CM | POA: Diagnosis present

## 2013-06-19 HISTORY — PX: ESOPHAGOGASTRODUODENOSCOPY: SHX5428

## 2013-06-19 SURGERY — EGD (ESOPHAGOGASTRODUODENOSCOPY)
Anesthesia: Moderate Sedation

## 2013-06-19 MED ORDER — SODIUM CHLORIDE 0.9 % IV SOLN
INTRAVENOUS | Status: DC
  Administered 2013-06-20: 500 mL via INTRAVENOUS
  Administered 2013-06-21: 12:00:00 via INTRAVENOUS

## 2013-06-19 MED ORDER — FENTANYL CITRATE 0.05 MG/ML IJ SOLN
INTRAMUSCULAR | Status: DC | PRN
  Administered 2013-06-19: 12.5 ug via INTRAVENOUS

## 2013-06-19 MED ORDER — MIDAZOLAM HCL 10 MG/2ML IJ SOLN
INTRAMUSCULAR | Status: DC | PRN
  Administered 2013-06-19: 2 mg via INTRAVENOUS

## 2013-06-19 MED ORDER — LIDOCAINE VISCOUS 2 % MT SOLN
OROMUCOSAL | Status: DC | PRN
  Administered 2013-06-19: 10 mL via OROMUCOSAL

## 2013-06-19 MED ORDER — INFLUENZA VAC SPLIT QUAD 0.5 ML IM SUSP
0.5000 mL | INTRAMUSCULAR | Status: AC
  Filled 2013-06-19: qty 0.5

## 2013-06-19 MED ORDER — DIPHENHYDRAMINE HCL 50 MG/ML IJ SOLN
INTRAMUSCULAR | Status: AC
  Filled 2013-06-19: qty 1

## 2013-06-19 MED ORDER — MIDAZOLAM HCL 5 MG/ML IJ SOLN
INTRAMUSCULAR | Status: AC
  Filled 2013-06-19: qty 1

## 2013-06-19 MED ORDER — PEG 3350-KCL-NA BICARB-NACL 420 G PO SOLR
4000.0000 mL | Freq: Once | ORAL | Status: AC
  Administered 2013-06-19: 4000 mL via ORAL
  Filled 2013-06-19 (×2): qty 4000

## 2013-06-19 MED ORDER — LIDOCAINE VISCOUS 2 % MT SOLN
OROMUCOSAL | Status: AC
  Filled 2013-06-19: qty 15

## 2013-06-19 MED ORDER — FENTANYL CITRATE 0.05 MG/ML IJ SOLN
INTRAMUSCULAR | Status: AC
  Filled 2013-06-19: qty 2

## 2013-06-19 NOTE — Consult Note (Signed)
Unassigned patient: Cross cover [facility].  Reason for Consult: Anemia with heme negative stools. Referring Physician: THP-Curtis Woods, MD  [name] [name] is an 79 y.o. female.  HPI: 79 year old white female, with a 3 week history of worsening shortness of breath, scheduled to have a cardiac cath that was then cancelled as she was found to have a drop in her hemoglobin from 13 gms/dl to 7.5 gm/dl on admission. She denies a history of abdominal pain, nausea, vomiting, melena or hematochezia. She does not think she has ever had a colonoscopy. She takes laxatives occasionally to help with her constipation.  Past Medical History  Diagnosis Date  . Hypertension   . Arthritis   . Hiatal hernia     GERD  . Iron deficiency anemia   . DJD (degenerative joint disease)     Hands  . Depression        Dementia      COPD      Hypertension      Chronic fatigue  Past Surgical History  Procedure Laterality Date  . Orif radial fracture  09/2007   Family History  Problem Relation Age of Onset  . Diabetes Father   . Stroke Father   . Atrial fibrillation Mother   . Hypertension Father   . Hypertension Mother   . CAD Maternal Grandfather   . Cancer Paternal Grandmother    Social History:  reports that she has quit smoking. She does not have any smokeless tobacco history on file. She reports that she does not drink alcohol or use illicit drugs.  Allergies:  Allergies  Allergen Reactions  . Sulfa Antibiotics Itching   Medications: I have reviewed the patient's current medications.  Results for orders placed during the hospital encounter of 06/17/13 (from the past 48 hour(s))  GLUCOSE, CAPILLARY     Status: Abnormal   Collection Time    06/17/13  6:41 PM      Result Value Range   Glucose-Capillary 122 (*) 70 - 99 mg/dL  CBC     Status: Abnormal   Collection Time    06/17/13  7:04 PM      Result Value Range   WBC 9.8  4.0 - 10.5 K/uL   RBC 3.35 (*) 3.87 - 5.11 MIL/uL   Hemoglobin 8.0 (*)  12.0 - 15.0 g/dL   HCT 26.3 (*) 36.0 - 46.0 %   MCV 78.5  78.0 - 100.0 fL   MCH 23.9 (*) 26.0 - 34.0 pg   MCHC 30.4  30.0 - 36.0 g/dL   RDW 14.1  11.5 - 15.5 %   Platelets 552 (*) 150 - 400 K/uL  COMPREHENSIVE METABOLIC PANEL     Status: Abnormal   Collection Time    06/17/13  7:04 PM      Result Value Range   Sodium 137  135 - 145 mEq/L   Potassium 4.0  3.5 - 5.1 mEq/L   Chloride 100  96 - 112 mEq/L   CO2 25  19 - 32 mEq/L   Glucose, Bld 111 (*) 70 - 99 mg/dL   BUN 18  6 - 23 mg/dL   Creatinine, Ser 1.10  0.50 - 1.10 mg/dL   Calcium 8.2 (*) 8.4 - 10.5 mg/dL   Total Protein 7.0  6.0 - 8.3 g/dL   Albumin 3.2 (*) 3.5 - 5.2 g/dL   AST 14  0 - 37 U/L   ALT 15  0 - 35 U/L   Alkaline Phosphatase 116    39 - 117 U/L   Total Bilirubin 0.1 (*) 0.3 - 1.2 mg/dL   GFR calc non Af Amer 45 (*) >90 mL/min   GFR calc Af Amer 53 (*) >90 mL/min   Comment: (NOTE)     The eGFR has been calculated using the CKD EPI equation.     This calculation has not been validated in all clinical situations.     eGFR's persistently <90 mL/min signify possible Chronic Kidney     Disease.  TYPE AND SCREEN     Status: None   Collection Time    06/17/13  7:05 PM      Result Value Range   ABO/RH(D) O POS     Antibody Screen NEG     Sample Expiration 06/20/2013     Unit Number [id number]     Blood Component Type RED CELLS,LR     Unit division 00     Status of Unit ISSUED,FINAL     Transfusion Status OK TO TRANSFUSE     Crossmatch Result Compatible     Unit Number [id number]     Blood Component Type RED CELLS,LR     Unit division 00     Status of Unit ISSUED,FINAL     Transfusion Status OK TO TRANSFUSE     Crossmatch Result Compatible     Unit Number [id number]     Blood Component Type RED CELLS,LR     Unit division 00     Status of Unit ALLOCATED     Transfusion Status OK TO TRANSFUSE     Crossmatch Result Compatible    OCCULT BLOOD, POC DEVICE     Status: None   Collection Time    06/17/13  10:53 PM      Result Value Range   Fecal Occult Bld NEGATIVE  NEGATIVE  VITAMIN B12     Status: None   Collection Time    06/17/13 11:02 PM      Result Value Range   Vitamin B-12 294  211 - 911 pg/mL   Comment: Performed at [facility]  FOLATE     Status: None   Collection Time    06/17/13 11:02 PM      Result Value Range   Folate 16.4     Comment: (NOTE)     Reference Ranges            Deficient:       0.4 - 3.3 ng/mL            Indeterminate:   3.4 - 5.4 ng/mL            Normal:              > 5.4 ng/mL     Performed at [facility]  IRON AND TIBC     Status: Abnormal   Collection Time    06/17/13 11:02 PM      Result Value Range   Iron 16 (*) 42 - 135 ug/dL   TIBC 469  250 - 470 ug/dL   Saturation Ratios 3 (*) 20 - 55 %   UIBC 453 (*) 125 - 400 ug/dL   Comment: Performed at [facility]  FERRITIN     Status: Abnormal   Collection Time    06/17/13 11:02 PM      Result Value Range   Ferritin 5 (*) 10 - 291 ng/mL   Comment: Performed at [facility]  RETICULOCYTES     Status:   Abnormal   Collection Time    06/17/13 11:02 PM      Result Value Range   Retic Ct Pct 1.8  0.4 - 3.1 %   RBC. 3.32 (*) 3.87 - 5.11 MIL/uL   Retic Count, Manual 59.8  19.0 - 186.0 K/uL  HEMOGLOBIN AND HEMATOCRIT, BLOOD     Status: Abnormal   Collection Time    06/17/13 11:29 PM      Result Value Range   Hemoglobin 8.1 (*) 12.0 - 15.0 g/dL   HCT 25.9 (*) 36.0 - 46.0 %  PREPARE RBC (CROSSMATCH)     Status: None   Collection Time    06/17/13 11:30 PM      Result Value Range   Order Confirmation ORDER PROCESSED BY BLOOD BANK    PREPARE RBC (CROSSMATCH)     Status: None   Collection Time    06/17/13 11:34 PM      Result Value Range   Order Confirmation ORDER PROCESSED BY BLOOD BANK    CBC     Status: Abnormal   Collection Time    06/18/13  7:55 AM      Result Value Range   WBC 9.6  4.0 - 10.5 K/uL   RBC 4.12  3.87 - 5.11 MIL/uL   Hemoglobin 10.5 (*) 12.0  - 15.0 g/dL   Comment: POST TRANSFUSION SPECIMEN   HCT 32.8 (*) 36.0 - 46.0 %   MCV 79.6  78.0 - 100.0 fL   MCH 25.5 (*) 26.0 - 34.0 pg   MCHC 32.0  30.0 - 36.0 g/dL   RDW 14.1  11.5 - 15.5 %   Platelets 448 (*) 150 - 400 K/uL  BASIC METABOLIC PANEL     Status: Abnormal   Collection Time    06/18/13  7:55 AM      Result Value Range   Sodium 139  135 - 145 mEq/L   Potassium 4.4  3.5 - 5.1 mEq/L   Chloride 104  96 - 112 mEq/L   CO2 27  19 - 32 mEq/L   Glucose, Bld 101 (*) 70 - 99 mg/dL   BUN 16  6 - 23 mg/dL   Creatinine, Ser 0.94  0.50 - 1.10 mg/dL   Calcium 8.3 (*) 8.4 - 10.5 mg/dL   GFR calc non Af Amer 55 (*) >90 mL/min   GFR calc Af Amer 64 (*) >90 mL/min   Comment: (NOTE)     The eGFR has been calculated using the CKD EPI equation.     This calculation has not been validated in all clinical situations.     eGFR's persistently <90 mL/min signify possible Chronic Kidney     Disease.  HEMOGLOBIN AND HEMATOCRIT, BLOOD     Status: Abnormal   Collection Time    06/18/13  2:30 PM      Result Value Range   Hemoglobin 10.4 (*) 12.0 - 15.0 g/dL   HCT 31.5 (*) 36.0 - 46.0 %   No results found.  Review of Systems  Constitutional: Positive for malaise/fatigue. Negative for fever, chills and weight loss.  HENT: Negative.   Respiratory: Positive for shortness of breath. Negative for cough, hemoptysis, sputum production and wheezing.   Gastrointestinal: Positive for constipation. Negative for heartburn, nausea, vomiting, abdominal pain, blood in stool and melena.  Musculoskeletal: Positive for back pain, joint pain and falls.  Skin: Negative.   Neurological: Positive for weakness.  Psychiatric/Behavioral: Positive for depression.   Blood pressure 147/75, pulse 86,   temperature 98.1 F (36.7 C), temperature source Oral, resp. rate 18, height 5' (1.524 m), weight 68.1 kg (150 lb 2.1 oz), SpO2 97.00%. Physical Exam  Constitutional: She is oriented to person, place, and time. Vital  signs are normal. She appears well-developed and well-nourished. She is cooperative.  HENT:  Head: Normocephalic and atraumatic.  Eyes: Lids are normal.  Neck: Trachea normal and normal range of motion. Neck supple.  Cardiovascular: Normal rate and regular rhythm.   Respiratory: Effort normal and breath sounds normal.  GI: Soft. Normal appearance and normal aorta.  Neurological: She is alert and oriented to person, place, and time.   Assessment/Plan: 1) Anemia with a 5 gm drop in hemoglobin: will proceed with an EGD at this time. If this is unrevealing, a colonoscopy will be planned. 2) Hiatal hernia-rule out Cameron lesions as a cause of her anemia; she is on PPI's.  3) History of chronic fatigue. 4) Dementia on Aricept 5) COPD on multiple medications. 6) HTN on Norvasc. Kerensa Nicklas 06/19/2013, 1:02 PM      

## 2013-06-19 NOTE — Op Note (Signed)
[facility] [address] [place], [address]   OPERATIVE PROCEDURE REPORT  PATIENT :[name] [name], [name] [name]  MR#: [id number] BIRTHDATE :02/03/1929 GENDER: Female ENDOSCOPIST: Dr.  Lorenza Burton, MD ASSISTANT:   Beryle Beams, technician Harold Barban, RN PROCEDURE DATE: 06/23/2013 PRE-PROCEDURE PREPERATION: Patient fasted for 4 hours prior to procedure. PRE-PROCEDURE PHYSICAL: Patient has stable vital signs.  Neck is supple.  There is no JVD, thyromegaly or LAD.  Chest clear to auscultation.  S1 and S2 regular.  Abdomen soft, non-distended, non-tender with NABS. PROCEDURE:     EGD, diagnostic ASA CLASS:     Class III INDICATIONS:     Iron deficiency anemia. MEDICATIONS:     Fentanyl 12.5 mcg & Versed 2 mg IV TOPICAL ANESTHETIC:   Viscous xylocaine-15 cc PO.  DESCRIPTION OF PROCEDURE: After the risks benefits and alternatives of the procedure were thoroughly explained, informed consent was obtained.  The Pentax Gastroscope [id number]  was introduced through the mouth and advanced to the second portion of the duodenum , without limitations. The instrument was slowly withdrawn as the mucosa was fully examined.   The esophagus, GEJ and the proximal small bowel appeared normal. There were no ulcers, masses or polyps noted. Retroflexed views revealed a 4-5 cm hiatal hernia with NO evidence of Cameron lesions. Some patchy gastritis was noted along with specks of old heme in the midbody and antrum. A small hemocystic spot was noted in the hiatal hernia but I do not think this is the source of her anemia. The scope was then withdrawn from the patient and the procedure terminated. The patient tolerated the procedure without immediate complications.  IMPRESSION:  1) Normal appearing esophagus and GEJ. 2) Hiatal Hernia 4 cm in size with a small hemocystic spot-unclear significance. 3) Patchy gastritis with some old heme in midbody and antrum. 4) Normal appearing  proximal small bowel.  RECOMMENDATIONS:     1.  Anti-reflux regimen to be followed. 2.  Avoid NSAIDS for now. 3.  Continue PPI's. 4.  Proceed with a colonoscopy tomorrow.  REPEAT EXAM:  None planned for now.  DISCHARGE INSTRUCTIONS: standard discharge instructions given _______________________________ eSigned:  Dr. Lorenza Burton, MD 06/23/13 2:06 PM   CPT CODES:     [phone], EGD  DIAGNOSIS CODES:     280.9 Iron Deficiency Anemia 553.3 Hiatal Hernia   CC: THP  PATIENT NAME:  [name] [name], [name] [name] MR#: [id number]

## 2013-06-19 NOTE — Progress Notes (Signed)
[facility] PROGRESS NOTE  [name] [name] ZOX:[id number] DOB: 23-Dec-1928 DOA: 06/17/2013 PCP: Gretel Acre, MD  Assessment/Plan: 1. Fatigue/DOE; counseled patient that most likely secondary to her profound anemia. -Contacted Dr.Aitesbaomo (cardiology) and he agreed that patient should have colonoscopy prior to any scheduled catheterization. -Patient will need to reschedule cardiac catheterization after discharge  2.  HTN; currently within [facility] guidelines. Continue current regimen  3. GI bleed; Dr. Loreta Ave (gastroenterologist on-call) performed EGD on patient today see results below -Dr. Loreta Ave to perform colonoscopy in the a.m.  4. Anemia; appears to be multifactorial; GI bleed? -Patient is severely iron deficient, Hold off on repleting iron until GI completes colonoscopy   5. Dementia; continue home regimen  6. COPD; continue home regimen  7. Gastritis; diagnosed per EGD, continue PPI, avoid all NSAIDs  Code Status: Full Family Communication: Spoke with [name] [name] (son) [phone]  Disposition Plan:    Consultants:  Gastroenterology (Dr. Loreta Ave)    Procedures:  EKG following repletion of PRBC 03/18/2013 Compared to EKG from 12/03/2012 no significant change ; NSR, RBBB  EGD 06/19/2013 IMPRESSION:  1) Normal appearing esophagus and GEJ.  2) Hiatal Hernia 4 cm in size with a small hemocystic spot-unclear  significance.  3) Patchy gastritis with some old heme in midbody and antrum.      Antibiotics:    HPI/Subjective: [name] [name] is a 79 y.o. female PMHx HTN, dementia, anemia, hypocalcemia, depression who presents to the ED with complaints of severe SOB weakness and fatigue X 3 weeks. She had been scheduled for cardiac stress test and cardiac evaluation (cardiac catheterization) next week by Dr. Mayford Knife [name] cardiology) and was being seen by the cardiologist today`and had blood work which revealed a hemoglobin level of 7.6, and her previous hemoglobin in 12/04/12  was 13.1 She was referred to the ED for symptomatic anemia. She denies having any hematemesis, hematochezia, or melena. She denies any ABD pain. In her remote history she reports having iron deficiency anemia many years ago and had to have Iron therapy. She does not recall having a GI workup. A rectal examination was performed by the EDP and was found to be HEME negative. She was referred for medical admission. Transfused 2 units PRBC current hemoglobin= 10.4. Currently patient states feels greatly improved, negative fatigue, negative DOE. Patient's son states approximately 20 years ago patient was suffering from iron deficiency anemia. Patient states negative melena/hematochezia. TODAY patient states doing well in request to go home.    Objective: Filed Vitals:   06/19/13 1355 06/19/13 1400 06/19/13 1405 06/19/13 1500  BP: 136/80 121/82 129/70 140/64  Pulse:      Temp:    98.1 F (36.7 C)  TempSrc:    Oral  Resp: 17 19 24 18   Height:      Weight:      SpO2: 97% 94% 93% 94%    Intake/Output Summary (Last 24 hours) at 06/19/13 1626 Last data filed at 06/19/13 1042  Gross per 24 hour  Intake   1200 ml  Output      0 ml  Net   1200 ml   Filed Weights   06/18/13 0458  Weight: 68.1 kg (150 lb 2.1 oz)    Exam:   General:  A./O. x4,NAD  Cardiovascular: Regular rhythm and rate, negative murmurs rubs or gallops, DP/PT one plus bilateral  Respiratory: Clear to auscultation bilateral  Abdomen: Soft, mild left lower quadrant tenderness outpatient, nondistended plus bowel sounds  Musculoskeletal: Negative pedal edema bilateral   Data  Reviewed: Basic Metabolic Panel:  Recent Labs Lab 06/17/13 1904 06/18/13 0755  NA 137 139  K 4.0 4.4  CL 100 104  CO2 25 27  GLUCOSE 111* 101*  BUN 18 16  CREATININE 1.10 0.94  CALCIUM 8.2* 8.3*   Liver Function Tests:  Recent Labs Lab 06/17/13 1904  AST 14  ALT 15  ALKPHOS 116  BILITOT 0.1*  PROT 7.0  ALBUMIN 3.2*   No results  found for this basename: LIPASE, AMYLASE,  in the last 168 hours No results found for this basename: AMMONIA,  in the last 168 hours CBC:  Recent Labs Lab 06/17/13 1904 06/17/13 2329 06/18/13 0755 06/18/13 1430  WBC 9.8  --  9.6  --   HGB 8.0* 8.1* 10.5* 10.4*  HCT 26.3* 25.9* 32.8* 31.5*  MCV 78.5  --  79.6  --   PLT 552*  --  448*  --    Cardiac Enzymes: No results found for this basename: CKTOTAL, CKMB, CKMBINDEX, TROPONINI,  in the last 168 hours BNP (last 3 results)  Recent Labs  12/03/12 1807  PROBNP 119.6   CBG:  Recent Labs Lab 06/17/13 1841  GLUCAP 122*    No results found for this or any previous visit (from the past 240 hour(s)).   Studies: No results found.  Scheduled Meds: . amLODipine  5 mg Oral q morning - 10a  . cholecalciferol  1,000 Units Oral q morning - 10a  . donepezil  10 mg Oral QHS  . pantoprazole (PROTONIX) IV  40 mg Intravenous Q12H  . polyethylene glycol-electrolytes  4,000 mL Oral Once  . senna  1 tablet Oral QODAY  . tiotropium  18 mcg Inhalation Daily  . venlafaxine XR  75 mg Oral QHS   Continuous Infusions: . sodium chloride 75 mL/hr at 06/19/13 1042  . sodium chloride      Principal Problem:   Anemia Active Problems:   Hypertension   Dementia   Hypocalcemia   Chronic obstructive pulmonary disease (COPD)   Chronic fatigue   DOE (dyspnea on exertion)   Unspecified gastritis and gastroduodenitis without mention of hemorrhage    Time spent:  30 minutes   [name], J  [facility] Pager [phone]. If 7PM-7AM, please contact night-coverage at [email or web address], password [facility] 06/19/2013, 4:26 PM  LOS: 2 days

## 2013-06-19 NOTE — Progress Notes (Signed)
[name] [name]'s O2 sats have remained above 95% on room air throughout 7pm-7am.

## 2013-06-20 ENCOUNTER — Encounter (HOSPITAL_COMMUNITY)
Admission: EM | Disposition: A | Discharge status: Home / Self Care | Payer: Self-pay | Source: Home / Self Care | Attending: Internal Medicine

## 2013-06-20 ENCOUNTER — Encounter (HOSPITAL_COMMUNITY): Payer: Self-pay | Admitting: Gastroenterology

## 2013-06-20 DIAGNOSIS — K5731 Diverticulosis of large intestine without perforation or abscess with bleeding: Secondary | ICD-10-CM

## 2013-06-20 DIAGNOSIS — K5520 Angiodysplasia of colon without hemorrhage: Secondary | ICD-10-CM | POA: Diagnosis present

## 2013-06-20 HISTORY — PX: HOT HEMOSTASIS: SHX5433

## 2013-06-20 HISTORY — PX: COLONOSCOPY: SHX5424

## 2013-06-20 SURGERY — COLONOSCOPY
Anesthesia: Moderate Sedation

## 2013-06-20 MED ORDER — MIDAZOLAM HCL 5 MG/ML IJ SOLN
INTRAMUSCULAR | Status: AC
  Filled 2013-06-20: qty 2

## 2013-06-20 MED ORDER — FERROUS GLUCONATE 324 (38 FE) MG PO TABS
324.0000 mg | ORAL_TABLET | Freq: Three times a day (TID) | ORAL | Status: DC
  Administered 2013-06-20 – 2013-06-21 (×3): 324 mg via ORAL
  Filled 2013-06-20 (×5): qty 1

## 2013-06-20 MED ORDER — VITAMIN C 250 MG PO TABS
250.0000 mg | ORAL_TABLET | Freq: Two times a day (BID) | ORAL | Status: DC
  Administered 2013-06-20 – 2013-06-21 (×2): 250 mg via ORAL
  Filled 2013-06-20 (×3): qty 1

## 2013-06-20 MED ORDER — FENTANYL CITRATE 0.05 MG/ML IJ SOLN
INTRAMUSCULAR | Status: AC
  Filled 2013-06-20: qty 2

## 2013-06-20 MED ORDER — PANTOPRAZOLE SODIUM 40 MG PO TBEC
40.0000 mg | DELAYED_RELEASE_TABLET | Freq: Every day | ORAL | Status: DC
  Administered 2013-06-20 – 2013-06-21 (×2): 40 mg via ORAL
  Filled 2013-06-20: qty 1

## 2013-06-20 MED ORDER — MIDAZOLAM HCL 5 MG/5ML IJ SOLN
INTRAMUSCULAR | Status: DC | PRN
  Administered 2013-06-20: 2 mg via INTRAVENOUS
  Administered 2013-06-20 (×2): 1 mg via INTRAVENOUS

## 2013-06-20 MED ORDER — FERROUS GLUCONATE 324 (38 FE) MG PO TABS
324.0000 mg | ORAL_TABLET | Freq: Two times a day (BID) | ORAL | Status: DC
  Filled 2013-06-20 (×2): qty 1

## 2013-06-20 MED ORDER — FERROUS FUMARATE 325 (106 FE) MG PO TABS
1.0000 | ORAL_TABLET | Freq: Two times a day (BID) | ORAL | Status: DC
  Filled 2013-06-20: qty 1

## 2013-06-20 MED ORDER — FENTANYL CITRATE 0.05 MG/ML IJ SOLN
INTRAMUSCULAR | Status: DC | PRN
  Administered 2013-06-20 (×2): 12.5 ug via INTRAVENOUS
  Administered 2013-06-20: 25 ug via INTRAVENOUS
  Administered 2013-06-20: 12.5 ug via INTRAVENOUS

## 2013-06-20 MED ORDER — POLYSACCHARIDE IRON COMPLEX 150 MG PO CAPS
150.0000 mg | ORAL_CAPSULE | Freq: Two times a day (BID) | ORAL | Status: DC
  Administered 2013-06-20: 150 mg via ORAL
  Filled 2013-06-20 (×2): qty 1

## 2013-06-20 NOTE — Op Note (Signed)
[facility] [address] [place], [address]   COLONOSCOPY PROCEDURE REPORT  PATIENT: [name], [name]  MR#: [id number] BIRTHDATE: [birth year] , 84  yrs. old GENDER: Female ENDOSCOPIST: Beverley Fiedler, MD REFERRED XB:JYNWG Hospitalist PROCEDURE DATE:  06/20/2013 PROCEDURE:   Colonoscopy, diagnostic and Colonoscopy with control of bleeding First Screening Colonoscopy - Avg.  risk and is 50 yrs.  old or older - No.  Prior Negative Screening - Now for repeat screening. N/A  History of Adenoma - Now for follow-up colonoscopy & has been > or = to 3 yrs.  N/A  Polyps Removed Today? No.  Recommend repeat exam, <10 yrs? No. ASA CLASS:   Class III INDICATIONS:Iron Deficiency Anemia. MEDICATIONS: These medications were titrated to patient response per physician's verbal order, Fentanyl 62.5 mcg IV, and Versed 4 mg IV   DESCRIPTION OF PROCEDURE:   After the risks benefits and alternatives of the procedure were thoroughly explained, informed consent was obtained.  A digital rectal exam revealed no rectal mass.   The Pentax Ped Colon [id number]  endoscope was introduced through the anus and advanced to the cecum, which was identified by both the appendix and ileocecal valve. No adverse events experienced.   The quality of the prep was good, using MoviPrep The instrument was then slowly withdrawn as the colon was fully examined.    COLON FINDINGS: Small and medium-sized angiodysplastic lesions were found in the cecum and in the ascending colon.  Destruction of the lesions via APC ablation was performed to control bleeding (1L/min, 30 W).  Care was given to ensure that the lumen was suctioned well. The treatment was successful.  There was severe diverticulosis noted in the descending colon and sigmoid colon with associated angulation, tortuosity and muscular hypertrophy making examination of this area somewhat difficult. Time was spent with careful withdrawal of the  colonoscope, no polyps or masses seen. Retroflexed views revealed no abnormalities.       The scope was withdrawn and the procedure complete.  COMPLICATIONS: There were no complications.  ENDOSCOPIC IMPRESSION: 1.   Cecal and ascending colon angioectasias; successful APC ablation 2.   There was severe diverticulosis noted in the descending colon and sigmoid colon 3.   No colonic polyps or masses found.  RECOMMENDATIONS: 1.  Daily oral iron replacement with Integra (or similar product). Follow iron stores to ensure normalization 2.  Monitor hemoglobin and hematocrit, transfuse if necessary 3.  Check H.  pylori serum antibody, given gastritis seen yesterday, treat if positive   eSigned:  Beverley Fiedler, MD 06/20/2013 11:25 AM   cc: The Patient and Della Goo, MD   PATIENT NAME:  [name], [name] MR#: [id number]

## 2013-06-20 NOTE — Progress Notes (Signed)
[facility] PROGRESS NOTE  [name] [name] WUJ:[id number] DOB: [birth year] DOA: 06/17/2013 PCP: Gretel Acre, MD  Assessment/Plan: 1. Fatigue/DOE; counseled patient that most likely secondary to her profound anemia. -Contacted Dr.Aitesbaomo (cardiology) and he agreed that patient should have colonoscopy prior to any scheduled catheterization. -Patient will need to reschedule cardiac catheterization after discharge  2.  HTN; currently within [facility] guidelines. Continue current regimen  3. GI bleed; Dr. Loreta Ave (gastroenterologist on-call) performed EGD on patient today see results below -Dr. Loreta Ave completed Colonoscopy; see results below -Obtain Helicobacter pylori labs (blood test, stool test) -Start Ferous Gluconate 324mg  TID + vitamin C 250 mg BID  4. Anemia; appears to be multifactorial; GI bleed - see #3  5. Dementia; continue home regimen  6. COPD; continue home regimen  7. Gastritis; diagnosed per EGD, continue PPI, avoid all NSAIDs  Code Status: Full Family Communication: Spoke with Haset Oaxaca (son) [phone]  Disposition Plan:    Consultants:  Gastroenterology (Dr. Loreta Ave)    Procedures:  EKG following repletion of PRBC 03/18/2013 Compared to EKG from 12/03/2012 no significant change ; NSR, RBBB  06/20/2013 colonoscopy 1. Cecal and ascending colon angioectasias; successful APC  ablation  2. There was severe diverticulosis noted in the descending colon  and sigmoid colon  3. No colonic polyps or masses found.   EGD 06/19/2013 IMPRESSION: 1) Normal appearing esophagus and GEJ.  2) Hiatal Hernia 4 cm in size with a small hemocystic spot-unclear  significance.  3) Patchy gastritis with some old heme in midbody and antrum.  4) Normal appearing proximal small bowel.        EGD 06/19/2013 IMPRESSION:  1) Normal appearing esophagus and GEJ.  2) Hiatal Hernia 4 cm in size with a small hemocystic spot-unclear  significance.  3) Patchy gastritis with some old  heme in midbody and antrum.      Antibiotics:    HPI/Subjective: [name] [name] is a 79 y.o. female PMHx HTN, dementia, anemia, hypocalcemia, depression who presents to the ED with complaints of severe SOB weakness and fatigue X 3 weeks. She had been scheduled for cardiac stress test and cardiac evaluation (cardiac catheterization) next week by Dr. Mayford Knife [facility] cardiology) and was being seen by the cardiologist today`and had blood work which revealed a hemoglobin level of 7.6, and her previous hemoglobin in 12/04/12 was 13.1 She was referred to the ED for symptomatic anemia. She denies having any hematemesis, hematochezia, or melena. She denies any ABD pain. In her remote history she reports having iron deficiency anemia many years ago and had to have Iron therapy. She does not recall having a GI workup. A rectal examination was performed by the EDP and was found to be HEME negative. She was referred for medical admission. Transfused 2 units PRBC current hemoglobin= 10.4. Currently patient states feels greatly improved, negative fatigue, negative DOE. Patient's son states approximately 20 years ago patient was suffering from iron deficiency anemia. Patient states negative melena/hematochezia. 06/19/2013 patient states doing well and request to go home. TODAY just returned from colonoscopy slightly groggy reports no results   Objective: Filed Vitals:   06/20/13 1150 06/20/13 1200 06/20/13 1210 06/20/13 1400  BP: 137/68 136/77 125/86 113/63  Pulse:    90  Temp:    97.7 F (36.5 C)  TempSrc:    Oral  Resp: 17 16 20    Height:      Weight:      SpO2: 100% 95% 92% 99%    Intake/Output Summary (Last 24 hours) at 06/20/13  1748 Last data filed at 06/20/13 1400  Gross per 24 hour  Intake   2665 ml  Output      0 ml  Net   2665 ml   Filed Weights   06/18/13 0458  Weight: 68.1 kg (150 lb 2.1 oz)    Exam:   General:  A./O. x4,NAD  Cardiovascular: Regular rhythm and rate, negative  murmurs rubs or gallops, DP/PT one plus bilateral  Respiratory: Clear to auscultation bilateral  Abdomen: Soft, mild left lower quadrant tenderness outpatient, nondistended plus bowel sounds  Musculoskeletal: Negative pedal edema bilateral   Data Reviewed: Basic Metabolic Panel:  Recent Labs Lab 06/17/13 1904 06/18/13 0755  NA 137 139  K 4.0 4.4  CL 100 104  CO2 25 27  GLUCOSE 111* 101*  BUN 18 16  CREATININE 1.10 0.94  CALCIUM 8.2* 8.3*   Liver Function Tests:  Recent Labs Lab 06/17/13 1904  AST 14  ALT 15  ALKPHOS 116  BILITOT 0.1*  PROT 7.0  ALBUMIN 3.2*   No results found for this basename: LIPASE, AMYLASE,  in the last 168 hours No results found for this basename: AMMONIA,  in the last 168 hours CBC:  Recent Labs Lab 06/17/13 1904 06/17/13 2329 06/18/13 0755 06/18/13 1430  WBC 9.8  --  9.6  --   HGB 8.0* 8.1* 10.5* 10.4*  HCT 26.3* 25.9* 32.8* 31.5*  MCV 78.5  --  79.6  --   PLT 552*  --  448*  --    Cardiac Enzymes: No results found for this basename: CKTOTAL, CKMB, CKMBINDEX, TROPONINI,  in the last 168 hours BNP (last 3 results)  Recent Labs  12/03/12 1807  PROBNP 119.6   CBG:  Recent Labs Lab 06/17/13 1841  GLUCAP 122*    No results found for this or any previous visit (from the past 240 hour(s)).   Studies: No results found.  Scheduled Meds: . amLODipine  5 mg Oral q morning - 10a  . cholecalciferol  1,000 Units Oral q morning - 10a  . donepezil  10 mg Oral QHS  . ferrous gluconate  324 mg Oral TID WC  . influenza vac split quadrivalent PF  0.5 mL Intramuscular Tomorrow-1000  . pantoprazole  40 mg Oral Q0600  . senna  1 tablet Oral QODAY  . tiotropium  18 mcg Inhalation Daily  . venlafaxine XR  75 mg Oral QHS  . vitamin C  250 mg Oral BID   Continuous Infusions: . sodium chloride 75 mL/hr at 06/20/13 1247  . sodium chloride 500 mL (06/20/13 2130)    Principal Problem:   Anemia Active Problems:   Hypertension    Dementia   Hypocalcemia   Chronic obstructive pulmonary disease (COPD)   Chronic fatigue   DOE (dyspnea on exertion)   Unspecified gastritis and gastroduodenitis without mention of hemorrhage   Angiodysplasia of colon   Diverticulosis of colon with hemorrhage    Time spent:  30 minutes   Rhylen Shaheen, J  [facility] Pager [phone]. If 7PM-7AM, please contact night-coverage at [email or web address], password [facility] 06/20/2013, 5:48 PM  LOS: 3 days

## 2013-06-20 NOTE — H&P (View-Only) (Signed)
Unassigned patient: [name] [name].  Reason for Consult: Anemia with heme negative stools. Referring Physician: THP-Curtis Joseph Art, MD  [name] [name] is an 79 y.o. female.  HPI: 79 year old white female, with a 3 week history of worsening shortness of breath, scheduled to have a cardiac cath that was then cancelled as she was found to have a drop in her hemoglobin from 13 gms/dl to 7.5 gm/dl on admission. She denies a history of abdominal pain, nausea, vomiting, melena or hematochezia. She does not think she has ever had a colonoscopy. She takes laxatives occasionally to help with her constipation.  Past Medical History  Diagnosis Date  . Hypertension   . Arthritis   . Hiatal hernia     GERD  . Iron deficiency anemia   . DJD (degenerative joint disease)     Hands  . Depression        Dementia      COPD      Hypertension      Chronic fatigue  Past Surgical History  Procedure Laterality Date  . Orif radial fracture  09/2007   Family History  Problem Relation Age of Onset  . Diabetes Father   . Stroke Father   . Atrial fibrillation Mother   . Hypertension Father   . Hypertension Mother   . CAD Maternal Grandfather   . Cancer Paternal Grandmother    Social History:  reports that she has quit smoking. She does not have any smokeless tobacco history on file. She reports that she does not drink alcohol or use illicit drugs.  Allergies:  Allergies  Allergen Reactions  . Sulfa Antibiotics Itching   Medications: I have reviewed the patient's current medications.  Results for orders placed during the hospital encounter of 06/17/13 (from the past 48 hour(s))  GLUCOSE, CAPILLARY     Status: Abnormal   Collection Time    06/17/13  6:41 PM      Result Value Range   Glucose-Capillary 122 (*) 70 - 99 mg/dL  CBC     Status: Abnormal   Collection Time    06/17/13  7:04 PM      Result Value Range   WBC 9.8  4.0 - 10.5 K/uL   RBC 3.35 (*) 3.87 - 5.11 MIL/uL   Hemoglobin 8.0 (*)  12.0 - 15.0 g/dL   HCT 14.7 (*) 82.9 - 56.2 %   MCV 78.5  78.0 - 100.0 fL   MCH 23.9 (*) 26.0 - 34.0 pg   MCHC 30.4  30.0 - 36.0 g/dL   RDW 13.0  86.5 - 78.4 %   Platelets 552 (*) 150 - 400 K/uL  COMPREHENSIVE METABOLIC PANEL     Status: Abnormal   Collection Time    06/17/13  7:04 PM      Result Value Range   Sodium 137  135 - 145 mEq/L   Potassium 4.0  3.5 - 5.1 mEq/L   Chloride 100  96 - 112 mEq/L   CO2 25  19 - 32 mEq/L   Glucose, Bld 111 (*) 70 - 99 mg/dL   BUN 18  6 - 23 mg/dL   Creatinine, Ser 6.96  0.50 - 1.10 mg/dL   Calcium 8.2 (*) 8.4 - 10.5 mg/dL   Total Protein 7.0  6.0 - 8.3 g/dL   Albumin 3.2 (*) 3.5 - 5.2 g/dL   AST 14  0 - 37 U/L   ALT 15  0 - 35 U/L   Alkaline Phosphatase 116  39 - 117 U/L   Total Bilirubin 0.1 (*) 0.3 - 1.2 mg/dL   GFR calc non Af Amer 45 (*) >90 mL/min   GFR calc Af Amer 53 (*) >90 mL/min   Comment: (NOTE)     The eGFR has been calculated using the CKD EPI equation.     This calculation has not been validated in all clinical situations.     eGFR's persistently <90 mL/min signify possible Chronic Kidney     Disease.  TYPE AND SCREEN     Status: None   Collection Time    06/17/13  7:05 PM      Result Value Range   ABO/RH(D) O POS     Antibody Screen NEG     Sample Expiration 06/20/2013     Unit Number [id number]     Blood Component Type RED CELLS,LR     Unit division 00     Status of Unit ISSUED,FINAL     Transfusion Status OK TO TRANSFUSE     Crossmatch Result Compatible     Unit Number [id number]     Blood Component Type RED CELLS,LR     Unit division 00     Status of Unit ISSUED,FINAL     Transfusion Status OK TO TRANSFUSE     Crossmatch Result Compatible     Unit Number [id number]     Blood Component Type RED CELLS,LR     Unit division 00     Status of Unit ALLOCATED     Transfusion Status OK TO TRANSFUSE     Crossmatch Result Compatible    OCCULT BLOOD, POC DEVICE     Status: None   Collection Time    06/17/13  10:53 PM      Result Value Range   Fecal Occult Bld NEGATIVE  NEGATIVE  VITAMIN B12     Status: None   Collection Time    06/17/13 11:02 PM      Result Value Range   Vitamin B-12 294  211 - 911 pg/mL   Comment: Performed at [facility]  FOLATE     Status: None   Collection Time    06/17/13 11:02 PM      Result Value Range   Folate 16.4     Comment: (NOTE)     Reference Ranges            Deficient:       0.4 - 3.3 ng/mL            Indeterminate:   3.4 - 5.4 ng/mL            Normal:              > 5.4 ng/mL     Performed at [facility]  IRON AND TIBC     Status: Abnormal   Collection Time    06/17/13 11:02 PM      Result Value Range   Iron 16 (*) 42 - 135 ug/dL   TIBC 010  272 - 536 ug/dL   Saturation Ratios 3 (*) 20 - 55 %   UIBC 453 (*) 125 - 400 ug/dL   Comment: Performed at [facility]  FERRITIN     Status: Abnormal   Collection Time    06/17/13 11:02 PM      Result Value Range   Ferritin 5 (*) 10 - 291 ng/mL   Comment: Performed at [facility]  RETICULOCYTES     Status:  Abnormal   Collection Time    06/17/13 11:02 PM      Result Value Range   Retic Ct Pct 1.8  0.4 - 3.1 %   RBC. 3.32 (*) 3.87 - 5.11 MIL/uL   Retic Count, Manual 59.8  19.0 - 186.0 K/uL  HEMOGLOBIN AND HEMATOCRIT, BLOOD     Status: Abnormal   Collection Time    06/17/13 11:29 PM      Result Value Range   Hemoglobin 8.1 (*) 12.0 - 15.0 g/dL   HCT 16.1 (*) 09.6 - 04.5 %  PREPARE RBC (CROSSMATCH)     Status: None   Collection Time    06/17/13 11:30 PM      Result Value Range   Order Confirmation ORDER PROCESSED BY BLOOD BANK    PREPARE RBC (CROSSMATCH)     Status: None   Collection Time    06/17/13 11:34 PM      Result Value Range   Order Confirmation ORDER PROCESSED BY BLOOD BANK    CBC     Status: Abnormal   Collection Time    06/18/13  7:55 AM      Result Value Range   WBC 9.6  4.0 - 10.5 K/uL   RBC 4.12  3.87 - 5.11 MIL/uL   Hemoglobin 10.5 (*) 12.0  - 15.0 g/dL   Comment: POST TRANSFUSION SPECIMEN   HCT 32.8 (*) 36.0 - 46.0 %   MCV 79.6  78.0 - 100.0 fL   MCH 25.5 (*) 26.0 - 34.0 pg   MCHC 32.0  30.0 - 36.0 g/dL   RDW 40.9  81.1 - 91.4 %   Platelets 448 (*) 150 - 400 K/uL  BASIC METABOLIC PANEL     Status: Abnormal   Collection Time    06/18/13  7:55 AM      Result Value Range   Sodium 139  135 - 145 mEq/L   Potassium 4.4  3.5 - 5.1 mEq/L   Chloride 104  96 - 112 mEq/L   CO2 27  19 - 32 mEq/L   Glucose, Bld 101 (*) 70 - 99 mg/dL   BUN 16  6 - 23 mg/dL   Creatinine, Ser 7.82  0.50 - 1.10 mg/dL   Calcium 8.3 (*) 8.4 - 10.5 mg/dL   GFR calc non Af Amer 55 (*) >90 mL/min   GFR calc Af Amer 64 (*) >90 mL/min   Comment: (NOTE)     The eGFR has been calculated using the CKD EPI equation.     This calculation has not been validated in all clinical situations.     eGFR's persistently <90 mL/min signify possible Chronic Kidney     Disease.  HEMOGLOBIN AND HEMATOCRIT, BLOOD     Status: Abnormal   Collection Time    06/18/13  2:30 PM      Result Value Range   Hemoglobin 10.4 (*) 12.0 - 15.0 g/dL   HCT 95.6 (*) 21.3 - 08.6 %   No results found.  Review of Systems  Constitutional: Positive for malaise/fatigue. Negative for fever, chills and weight loss.  HENT: Negative.   Respiratory: Positive for shortness of breath. Negative for cough, hemoptysis, sputum production and wheezing.   Gastrointestinal: Positive for constipation. Negative for heartburn, nausea, vomiting, abdominal pain, blood in stool and melena.  Musculoskeletal: Positive for back pain, joint pain and falls.  Skin: Negative.   Neurological: Positive for weakness.  Psychiatric/Behavioral: Positive for depression.   Blood pressure 147/75, pulse 86,  temperature 98.1 F (36.7 C), temperature source Oral, resp. rate 18, height 5' (1.524 m), weight 68.1 kg (150 lb 2.1 oz), SpO2 97.00%. Physical Exam  Constitutional: She is oriented to person, place, and time. Vital  signs are normal. She appears well-developed and well-nourished. She is cooperative.  HENT:  Head: Normocephalic and atraumatic.  Eyes: Lids are normal.  Neck: Trachea normal and normal range of motion. Neck supple.  Cardiovascular: Normal rate and regular rhythm.   Respiratory: Effort normal and breath sounds normal.  GI: Soft. Normal appearance and normal aorta.  Neurological: She is alert and oriented to person, place, and time.   Assessment/Plan: 1) Anemia with a 5 gm drop in hemoglobin: will proceed with an EGD at this time. If this is unrevealing, a colonoscopy will be planned. 2) Hiatal hernia-rule out Cameron lesions as a cause of her anemia; she is on PPI's.  3) History of chronic fatigue. 4) Dementia on Aricept 5) COPD on multiple medications. 6) HTN on Norvasc. Karuna Balducci 06/19/2013, 1:02 PM

## 2013-06-20 NOTE — Interval H&P Note (Signed)
History and Physical Interval Note: Patient admitted with new anemia, with iron deficiency Transfusion now stable hemoglobin though she remains anemic Upper endoscopy yesterday revealed large hiatal hernia, one possible angiectasia not felt to be the source of blood loss.  Mild gastritis versus gastropathy Plan for colonoscopy today for further investigation of iron deficiency anemia, no known previous colonoscopy CT scan from last year unremarkable from the colon standpoint other than left-sided diverticulosis The nature of the procedure, as well as the risks, benefits, and alternatives were carefully and thoroughly reviewed with the patient. Ample time for discussion and questions allowed. The patient understood, was satisfied, and agreed to proceed.     06/20/2013 10:24 AM  [name] [name]  has presented today for surgery, with the diagnosis of anemia  The various methods of treatment have been discussed with the patient and family. After consideration of risks, benefits and other options for treatment, the patient has consented to  Procedure(s): COLONOSCOPY (N/A) as a surgical intervention .  The patient's history has been reviewed, patient examined, no change in status, stable for surgery.  I have reviewed the patient's chart and labs.  Questions were answered to the patient's satisfaction.     Martha Soltys M

## 2013-06-20 NOTE — Interval H&P Note (Signed)
History and Physical Interval Note: I spoke with her son, Loraine Leriche, by phone. We discussed the procedure he understanding and agrees to proceed. He feels she has previously had a colonoscopy, but it has been many years ago. Proceed with plan as previously documented  06/20/2013 10:36 AM  [name] [name]  has presented today for surgery, with the diagnosis of anemia  The various methods of treatment have been discussed with the patient and family. After consideration of risks, benefits and other options for treatment, the patient has consented to  Procedure(s): COLONOSCOPY (N/A) as a surgical intervention .  The patient's history has been reviewed, patient examined, no change in status, stable for surgery.  I have reviewed the patient's chart and labs.  Questions were answered to the patient's satisfaction.     Demontae Antunes M

## 2013-06-21 DIAGNOSIS — D509 Iron deficiency anemia, unspecified: Principal | ICD-10-CM

## 2013-06-21 LAB — TYPE AND SCREEN: Unit division: 0

## 2013-06-21 LAB — CBC
MCHC: 32.8 g/dL (ref 30.0–36.0)
Platelets: 475 10*3/uL — ABNORMAL HIGH (ref 150–400)
RBC: 4.04 MIL/uL (ref 3.87–5.11)
RDW: 15.1 % (ref 11.5–15.5)
WBC: 10.2 10*3/uL (ref 4.0–10.5)

## 2013-06-21 MED ORDER — ASCORBIC ACID 250 MG PO TABS: 250.0000 mg | ORAL_TABLET | Freq: Two times a day (BID) | ORAL | Status: DC

## 2013-06-21 MED ORDER — FERROUS GLUCONATE 324 (38 FE) MG PO TABS
324.0000 mg | ORAL_TABLET | Freq: Two times a day (BID) | ORAL | Status: DC
  Administered 2013-06-21: 324 mg via ORAL
  Filled 2013-06-21 (×2): qty 1

## 2013-06-21 MED ORDER — PANTOPRAZOLE SODIUM 40 MG PO TBEC: 40.0000 mg | DELAYED_RELEASE_TABLET | Freq: Every day | ORAL | Status: DC

## 2013-06-21 MED ORDER — FERROUS GLUCONATE 324 (38 FE) MG PO TABS: 324.0000 mg | ORAL_TABLET | Freq: Two times a day (BID) | ORAL | Status: DC

## 2013-06-21 NOTE — Clinical Social Work Note (Signed)
Clinical Social Worker was informed that patient was from facility, [facility]. CSW spoke with patient who confirmed she was from [facility]. CSW spoke with facility and they reported that [facility] is [facility] and will be able to return at discharge. CSW will sign off, as social work intervention is no longer needed.   Rozetta Nunnery MSW, [employer] [phone]

## 2013-06-21 NOTE — Progress Notes (Signed)
Patient discharged to home with instructions. 

## 2013-06-21 NOTE — Progress Notes (Signed)
Patient seen, examined, and I agree with the above documentation, including the assessment and plan. Pt with IDA, gastritis found at EGD.  Agree with once daily PPI.  Await H. Pylori serum AB and treat if positive Cecal AVMs yesterday s/p APC ablation Follow Hgb, replace iron orally and follow iron stores Has outpatient follow-up and can see GI if needed

## 2013-06-21 NOTE — Progress Notes (Signed)
     [facility] Daily Rounding Note 06/21/2013, 11:48 AM  SUBJECTIVE:       C/o of her nails breaking , loss of hair.  No stools since bowel prep, was on laxative PTA.  Not clear she was taking PPI PTA, though listed as a med.  OBJECTIVE:         Vital signs in last 24 hours:    Temp:  [97.2 F (36.2 C)-97.7 F (36.5 C)] 97.2 F (36.2 C) (09/30 0442) Pulse Rate:  [77-95] 95 (09/30 0442) Resp:  [16-20] 18 (09/30 0442) BP: (113-153)/(63-100) 150/71 mmHg (09/30 0442) SpO2:  [92 %-100 %] 97 % (09/30 0442) Last BM Date: 06/20/13 General: looks ok, somewhat pale but alert   Heart: RRR Chest: clear bil Abdomen: soft, NT, ND, no  mass  Extremities: no CCE Neuro/Psych:  Pleasant, engaged, no gross deficits. Not anxious or depressed.   Intake/Output from previous day: 09/29 0701 - 09/30 0700 In: 1910 [P.O.:480; I.V.:1430] Out: 1125 [Urine:1125]  Intake/Output this shift: Total I/O In: -  Out: 200 [Urine:200]  Lab Results:  Recent Labs  06/18/13 1430  HGB 10.4*  HCT 31.5*    ASSESMENT: *  IDA.  Started on po Iron 9/29.  No hgb since 9/27 EGD 9/28:  HH with small "hemocystic spot of unclear significance", patchy gastritis, old blood in stomach. . 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  Left sided dense diverticulosis.    PLAN: *  If H pylori is +,  Will need abx  Regimen added to PPI *  i ordered CBC  For now.  *  Any PPI is sufficient give Rx  For whatever is  Least costly.  *  She should have H & H monitored in 2 weeks, q  6 weeks thereafter, can prolong time between labs if H & H stable.  I got appt with [facility] with Dr Corliss Blacker for Oct 9th *  GI will sign off.  Will notify pt if she needs treatment of H pylori.    LOS: 4 days   Jennye Moccasin  06/21/2013, 11:48 AM Pager: [phone]

## 2013-06-21 NOTE — Discharge Summary (Signed)
Physician Discharge Summary  [name] [name] NUU:[id number] DOB: 08-04-1929 DOA: 06/17/2013  PCP: Gretel Acre, MD  Admit date: 06/17/2013 Discharge date: 06/21/2013  Time spent:  Recommendations for Outpatient Follow-up:  1. Fatigue/DOE; counseled patient that most likely secondary to her profound anemia. -Contacted Dr.Aitesbaomo (cardiology) and he agreed that patient should have colonoscopy prior to any scheduled catheterization.  -Patient will need to reschedule cardiac catheterization after discharge   2. HTN; currently within [facility] guidelines. Continue current regimen  3. GI bleed; Dr. Loreta Ave (gastroenterologist on-call) performed EGD on patient today see results below -Dr. Loreta Ave completed Colonoscopy; see results below  -Obtain Helicobacter pylori labs (blood test, stool test)  -Start Ferous Gluconate 324mg  TID + vitamin C 250 mg BID   4. Anemia; appears to be multifactorial; GI bleed- see #3   5. Dementia; continue home regimen  6. COPD; continue home regimen   7. Gastritis; diagnosed per EGD, continue PPI, avoid all NSAIDs    Discharge Diagnoses:  Principal Problem:   Anemia Active Problems:   Hypertension   Dementia   Hypocalcemia   Chronic obstructive pulmonary disease (COPD)   Chronic fatigue   DOE (dyspnea on exertion)   Unspecified gastritis and gastroduodenitis without mention of hemorrhage   Angiodysplasia of colon   Diverticulosis of colon with hemorrhage   Discharge Condition: Stable  Diet recommendation: Regular  Filed Weights   06/18/13 0458  Weight: 68.1 kg (150 lb 2.1 oz)    History of present illness:  [name] [name] is a 79 y.o. female PMHx HTN, dementia, anemia, hypocalcemia, depression who presents to the ED with complaints of severe SOB weakness and fatigue X 3 weeks. She had been scheduled for cardiac stress test and cardiac evaluation (cardiac catheterization) next week by Dr. Mayford Knife [facility] cardiology) and was being seen by the  cardiologist today`and had blood work which revealed a hemoglobin level of 7.6, and her previous hemoglobin in 12/04/12 was 13.1 She was referred to the ED for symptomatic anemia. She denies having any hematemesis, hematochezia, or melena. She denies any ABD pain. In her remote history she reports having iron deficiency anemia many years ago and had to have Iron therapy. She does not recall having a GI workup. A rectal examination was performed by the EDP and was found to be HEME negative. She was referred for medical admission. Transfused 2 units PRBC current hemoglobin= 10.4. Currently patient states feels greatly improved, negative fatigue, negative DOE. Patient's son states approximately 20 years ago patient was suffering from iron deficiency anemia. Patient states negative melena/hematochezia. 06/19/2013 patient states doing well and request to go home. 06/20/2013 just returned from colonoscopy slightly groggy reports no results TODAY ready for discharge .   Hospital Course:    Procedures: EKG following repletion of PRBC 03/18/2013 Compared to EKG from 12/03/2012 no significant change ; NSR, RBBB  06/20/2013 colonoscopy  1. Cecal and ascending colon angioectasias; successful APC  ablation  2. There was severe diverticulosis noted in the descending colon  and sigmoid colon  3. No colonic polyps or masses found.   EGD 06/19/2013  IMPRESSION: 1) Normal appearing esophagus and GEJ.  2) Hiatal Hernia 4 cm in size with a small hemocystic spot-unclear  significance.   3) Patchy gastritis with some old heme in midbody and antrum.  4) Normal appearing proximal small bowel.    Consultations:  Gastroenterology  Discharge Exam: Filed Vitals:   06/20/13 1400 06/20/13 2103 06/21/13 0442 06/21/13 1337  BP: 113/63 153/100 150/71 135/62  Pulse:  90 77 95 82  Temp: 97.7 F (36.5 C) 97.2 F (36.2 C) 97.2 F (36.2 C) 97.4 F (36.3 C)  TempSrc: Oral Oral Oral Oral  Resp:  20 18 18   Height:       Weight:      SpO2: 99% 98% 97% 98%   General: A./O. x4,NAD  Cardiovascular: Regular rhythm and rate, negative murmurs rubs or gallops, DP/PT one plus bilateral  Respiratory: Clear to auscultation bilateral  Abdomen: Soft, mild left lower quadrant tenderness outpatient, nondistended plus bowel sounds  Musculoskeletal: Negative pedal edema bilateral   Discharge Instructions     Medication List    ASK your doctor about these medications       amLODipine 5 MG tablet  Commonly known as:  NORVASC  Take 5 mg by mouth every morning.     aspirin 81 MG EC tablet  Take 1 tablet (81 mg total) by mouth daily.     cholecalciferol 1000 UNITS tablet  Commonly known as:  VITAMIN D  Take 1,000 Units by mouth every morning.     donepezil 10 MG tablet  Commonly known as:  ARICEPT  Take 10 mg by mouth at bedtime.     fluticasone 50 MCG/ACT nasal spray  Commonly known as:  FLONASE  Place 2 sprays into the nose daily.     Fluticasone-Salmeterol 500-50 MCG/DOSE Aepb  Commonly known as:  ADVAIR  Inhale 1 puff into the lungs every 12 (twelve) hours.     loratadine 10 MG tablet  Commonly known as:  CLARITIN  Take 10 mg by mouth daily as needed. For allergies     loratadine 10 MG tablet  Commonly known as:  CLARITIN  Take 10 mg by mouth daily.     nitroGLYCERIN 0.4 MG SL tablet  Commonly known as:  NITROSTAT  Place 1 tablet (0.4 mg total) under the tongue every 5 (five) minutes as needed for chest pain.     pantoprazole 40 MG tablet  Commonly known as:  PROTONIX  Take 40 mg by mouth daily.     senna 8.6 MG Tabs tablet  Commonly known as:  SENOKOT  Take 1 tablet by mouth every other day.     tiotropium 18 MCG inhalation capsule  Commonly known as:  SPIRIVA  Place 18 mcg into inhaler and inhale daily.     traMADol 50 MG tablet  Commonly known as:  ULTRAM  Take 50-100 mg by mouth 2 (two) times daily as needed for pain (pt will take 50 mg in the a.m. and 100 mg in the p.m. if  needed for pain).     venlafaxine XR 75 MG 24 hr capsule  Commonly known as:  EFFEXOR-XR  Take 75 mg by mouth at bedtime.       Allergies  Allergen Reactions  . Sulfa Antibiotics Itching      The results of significant diagnostics from this hospitalization (including imaging, microbiology, ancillary and laboratory) are listed below for reference.    Significant Diagnostic Studies: No results found.  Microbiology: No results found for this or any previous visit (from the past 240 hour(s)).   Labs: Basic Metabolic Panel:  Recent Labs Lab 06/17/13 1904 06/18/13 0755  NA 137 139  K 4.0 4.4  CL 100 104  CO2 25 27  GLUCOSE 111* 101*  BUN 18 16  CREATININE 1.10 0.94  CALCIUM 8.2* 8.3*   Liver Function Tests:  Recent Labs Lab 06/17/13 1904  AST 14  ALT  15  ALKPHOS 116  BILITOT 0.1*  PROT 7.0  ALBUMIN 3.2*   No results found for this basename: LIPASE, AMYLASE,  in the last 168 hours No results found for this basename: AMMONIA,  in the last 168 hours CBC:  Recent Labs Lab 06/17/13 1904 06/17/13 2329 06/18/13 0755 06/18/13 1430 06/21/13 1300  WBC 9.8  --  9.6  --  10.2  HGB 8.0* 8.1* 10.5* 10.4* 10.5*  HCT 26.3* 25.9* 32.8* 31.5* 32.0*  MCV 78.5  --  79.6  --  79.2  PLT 552*  --  448*  --  475*   Cardiac Enzymes: No results found for this basename: CKTOTAL, CKMB, CKMBINDEX, TROPONINI,  in the last 168 hours BNP: BNP (last 3 results)  Recent Labs  12/03/12 1807  PROBNP 119.6   CBG:  Recent Labs Lab 06/17/13 1841  GLUCAP 122*       Signed:  Carolyne Littles, MD [facility] [phone] pager

## 2013-06-22 ENCOUNTER — Encounter (HOSPITAL_COMMUNITY): Payer: Self-pay | Admitting: Internal Medicine

## 2013-06-22 LAB — HELICOBACTER PYLORI ABS-IGG+IGA, BLD: H Pylori IgA: 3.8 U/mL (ref <9.0)

## 2013-06-23 ENCOUNTER — Telehealth: Payer: Self-pay | Admitting: Cardiology

## 2013-06-23 ENCOUNTER — Encounter: Payer: Self-pay | Admitting: Internal Medicine

## 2013-06-23 NOTE — Telephone Encounter (Signed)
Pt's sister was given our number and address, with a doctor's name she couldn't pronounce but not a dr here, sister just checking to see if pt needs fu with turner since she had a cath set up Friday that was cancelled due to her being in the hospital

## 2013-06-23 NOTE — Telephone Encounter (Signed)
Pt IS SEEING A GI AND THAT IS WHO THEY ARE SCHEDULED WITH. DR TURNER DOES WANT TO SEE THE PT WITH IN THE NEXT WEEK. CAN YOU PLEASE SCHEDULE THE PT TO SEE DR TURNER?

## 2013-06-24 ENCOUNTER — Encounter (HOSPITAL_COMMUNITY): Payer: Self-pay

## 2013-06-24 ENCOUNTER — Ambulatory Visit (HOSPITAL_COMMUNITY): Admit: 2013-06-24 | Payer: Medicare Other | Admitting: Cardiology

## 2013-06-24 SURGERY — LEFT HEART CATHETERIZATION WITH CORONARY ANGIOGRAM
Anesthesia: LOCAL

## 2013-06-27 ENCOUNTER — Telehealth: Payer: Self-pay | Admitting: Cardiology

## 2013-06-27 ENCOUNTER — Encounter: Payer: Self-pay | Admitting: Cardiology

## 2013-06-27 ENCOUNTER — Ambulatory Visit (INDEPENDENT_AMBULATORY_CARE_PROVIDER_SITE_OTHER): Payer: Medicare Other | Admitting: Cardiology

## 2013-06-27 VITALS — BP 122/62 | HR 76 | Ht 61.0 in | Wt 151.0 lb

## 2013-06-27 DIAGNOSIS — R0602 Shortness of breath: Secondary | ICD-10-CM

## 2013-06-27 DIAGNOSIS — R079 Chest pain, unspecified: Secondary | ICD-10-CM

## 2013-06-27 DIAGNOSIS — D649 Anemia, unspecified: Secondary | ICD-10-CM

## 2013-06-27 DIAGNOSIS — I10 Essential (primary) hypertension: Secondary | ICD-10-CM

## 2013-06-27 LAB — CBC
HCT: 32.7 % — ABNORMAL LOW (ref 36.0–46.0)
MCV: 80.8 fl (ref 78.0–100.0)
Platelets: 427 10*3/uL — ABNORMAL HIGH (ref 150.0–400.0)
RBC: 4.05 Mil/uL (ref 3.87–5.11)
WBC: 10 10*3/uL (ref 4.5–10.5)

## 2013-06-27 NOTE — Telephone Encounter (Signed)
Spoke with son. He is aware or pts lab results we ordered.

## 2013-06-27 NOTE — Telephone Encounter (Signed)
Follow UP   Pt returning call about results.

## 2013-06-27 NOTE — Patient Instructions (Addendum)
Please go to the lab for your CBC.  Your physician recommends that you schedule a follow-up appointment in: 4 weeks with Dr. Mayford Knife

## 2013-06-27 NOTE — Progress Notes (Signed)
[address] [place], [place]  [address] Phone: [phone] Fax:  [phone]  Date:  06/27/2013   ID:  [name] [name], DOB 1929-02-20, MRN [id number]  PCP:  Gretel Acre, MD  Cardiologist:  Armanda Magic, MD     History of Present Illness: [name] [name] is a 79 y.o. female with a recent history of SOB.  She had a complete cardiac workup done including a nuclear stress test which showed a very small inferoapical defect felt due to breast attenuation.  She has a very large hiatal hernia and was started on Protonix but had no improvement in her SOB or chest pain.  She was set up for a cath but during her preop workup she was found to have significant anemia with a Hbg of 7.1.  She was sent to the hospital and admitted.  She underwent colonoscopy and EGD which revealed small and medium angiodysplastic lesions in the cecum and ascending colon as well as severe diverticulosis of the descending and sigmoid colon.  She underwent APC ablation.  She was placed on iron.  She still feels tired but her SOB has significantly improved although she still has some with exertion.    Wt Readings from Last 3 Encounters:  06/27/13 151 lb (68.493 kg)  06/18/13 150 lb 2.1 oz (68.1 kg)  06/18/13 150 lb 2.1 oz (68.1 kg)     Past Medical History  Diagnosis Date  . Hypertension   . Arthritis   . Hiatal hernia     GERD  . Iron deficiency anemia   . DJD (degenerative joint disease)     Hands  . Depression   . Anxiety   . Allergic rhinitis   . Vitamin B deficiency   . Vitamin D deficiency   . GERD (gastroesophageal reflux disease)   . Osteoporosis   . Hemorrhoids   . Gastritis   . Vertigo   . Dementia   . COPD (chronic obstructive pulmonary disease)   . Subdural hemorrhage   . Angiodysplasia of colon with hemorrhage 05/2013    Current Outpatient Prescriptions  Medication Sig Dispense Refill  . amLODipine (NORVASC) 5 MG tablet Take 5 mg by mouth every morning.       Marland Kitchen aspirin EC 81 MG  EC tablet Take 1 tablet (81 mg total) by mouth daily.  30 tablet  1  . cholecalciferol (VITAMIN D) 1000 UNITS tablet Take 1,000 Units by mouth every morning.       . donepezil (ARICEPT) 10 MG tablet Take 10 mg by mouth at bedtime.      . ferrous gluconate (FERGON) 324 MG tablet Take 1 tablet (324 mg total) by mouth 2 (two) times daily with a meal.  120 tablet  0  . fluticasone (FLONASE) 50 MCG/ACT nasal spray Place 2 sprays into the nose daily.      . Fluticasone-Salmeterol (ADVAIR) 500-50 MCG/DOSE AEPB Inhale 1 puff into the lungs every 12 (twelve) hours.      Marland Kitchen loratadine (CLARITIN) 10 MG tablet Take 10 mg by mouth daily as needed. For allergies      . loratadine (CLARITIN) 10 MG tablet Take 10 mg by mouth daily.      . nitroGLYCERIN (NITROSTAT) 0.4 MG SL tablet Place 1 tablet (0.4 mg total) under the tongue every 5 (five) minutes as needed for chest pain.  30 tablet  0  . pantoprazole (PROTONIX) 40 MG tablet Take 40 mg by mouth daily.      Marland Kitchen  pantoprazole (PROTONIX) 40 MG tablet Take 1 tablet (40 mg total) by mouth daily at 6 (six) AM.  30 tablet  0  . senna (SENOKOT) 8.6 MG TABS Take 1 tablet by mouth every other day.      . tiotropium (SPIRIVA) 18 MCG inhalation capsule Place 18 mcg into inhaler and inhale daily.      . traMADol (ULTRAM) 50 MG tablet Take 50-100 mg by mouth 2 (two) times daily as needed for pain (pt will take 50 mg in the a.m. and 100 mg in the p.m. if needed for pain).      Marland Kitchen venlafaxine XR (EFFEXOR-XR) 75 MG 24 hr capsule Take 75 mg by mouth at bedtime.      . vitamin C (VITAMIN C) 250 MG tablet Take 1 tablet (250 mg total) by mouth 2 (two) times daily.  60 tablet  0   No current facility-administered medications for this visit.    Allergies:    Allergies  Allergen Reactions  . Sulfa Antibiotics Itching    Social History:  The patient  reports that she has quit smoking. She does not have any smokeless tobacco history on file. She reports that she does not drink  alcohol or use illicit drugs.   Family History:  The patient's family history includes Atrial fibrillation in her mother; CAD in her maternal grandfather; Cancer in her paternal grandmother; Diabetes in her father; Hypertension in her father and mother; Stroke in her father.   ROS:  Please see the history of present illness.      All other systems reviewed and negative.   PHYSICAL EXAM: VS:  BP 122/62  Pulse 76  Ht 5\' 1"  (1.549 m)  Wt 151 lb (68.493 kg)  BMI 28.55 kg/m2 Well nourished, well developed, in no acute distress HEENT: normal Neck: no JVD Cardiac:  normal S1, S2; RRR; no murmur Lungs:  clear to auscultation bilaterally, no wheezing, rhonchi or rales Abd: soft, nontender, no hepatomegaly Ext: no edema Skin: warm and dry Neuro:  CNs 2-12 intact, no focal abnormalities noted      ASSESSMENT AND PLAN:  1. SOB - most likely secondary to severe anemia.  We had planned a cardiac cath despite a nuclear stress test showing no ischemia because no other etiology at the time of last OV could be found.  Precath workup showed severe anemia which is most likely etiology of SOB.  I had a long talk with patient and her son.  Since her nuclear stress test was low risk and we have another etiology for SOB I think we should hold off on cath.  Her SOB is her main complaint which has significantly improved after colon procedure and blood transfusion.  I will check her CBC today.  I am going to see her back in 4 weeks and if her SOB has completely resolved then we will not proceed with cath.  Signed, Armanda Magic, MD 06/27/2013 12:55 PM

## 2013-07-28 ENCOUNTER — Ambulatory Visit (INDEPENDENT_AMBULATORY_CARE_PROVIDER_SITE_OTHER): Payer: Medicare Other | Admitting: Cardiology

## 2013-07-28 ENCOUNTER — Other Ambulatory Visit: Payer: Self-pay

## 2013-07-28 ENCOUNTER — Encounter: Payer: Self-pay | Admitting: Cardiology

## 2013-07-28 VITALS — BP 110/58 | HR 96 | Ht 64.0 in | Wt 142.8 lb

## 2013-07-28 DIAGNOSIS — R0602 Shortness of breath: Secondary | ICD-10-CM

## 2013-07-28 DIAGNOSIS — I10 Essential (primary) hypertension: Secondary | ICD-10-CM

## 2013-07-28 DIAGNOSIS — I208 Other forms of angina pectoris: Secondary | ICD-10-CM

## 2013-07-28 LAB — BASIC METABOLIC PANEL
BUN: 13 mg/dL (ref 6–23)
CO2: 30 mEq/L (ref 19–32)
Creatinine, Ser: 1 mg/dL (ref 0.4–1.2)
GFR: 56.37 mL/min — ABNORMAL LOW (ref >60.00)
Glucose, Bld: 109 mg/dL — ABNORMAL HIGH (ref 70–99)
Potassium: 4.5 mEq/L (ref 3.5–5.1)

## 2013-07-28 NOTE — Patient Instructions (Addendum)
Your physician recommends that you schedule a follow-up appointment in 4 weeks.  Non-Cardiac CT Angiography (CTA), is a special type of CT scan that uses a computer to produce multi-dimensional views of major blood vessels throughout the body. In CT angiography, a contrast material is injected through an IV to help visualize the blood vessels  Please go to the lab today for a Basic Metabolic Panel

## 2013-07-28 NOTE — Progress Notes (Signed)
[address] [place], [place]  [address] Phone: [phone] Fax:  [phone]  Date:  07/28/2013   ID:  [name] [name], DOB Aug 02, 1929, MRN [id number]  PCP:  Gretel Acre, MD  Cardiologist:  Armanda Magic, MD     History of Present Illness: [name] [name] is a 79 y.o. female with a recent history of SOB. She had a complete cardiac workup done including a nuclear stress test which showed a very small inferoapical defect felt due to breast attenuation. She has a very large hiatal hernia and was started on Protonix but had no improvement in her SOB or chest pain. She was set up for a cath but during her preop workup she was found to have significant anemia with a Hbg of 7.1. She was sent to the hospital and admitted. She underwent colonoscopy and EGD which revealed small and medium angiodysplastic lesions in the cecum and ascending colon as well as severe diverticulosis of the descending and sigmoid colon. She underwent APC ablation. She was placed on iron. She saw me recently and her SOB had improved but over the past week or so she has had worsening SOB and she went to see her PCP to see if she was anemic.  A CBC was done and the Hbg was 11.4.  She says that she is very SOB even when sitting down but worse with activity.  She is very sedentary and her sisters are here today and say that she does not participate in any of the activities that are offered at her [facility] Living Facility.  She recently started having some discomfort in her chest in the midsternal region.  This is nonexertional.She also complains that she has a lot of mucous that she spits up from her stomach.  She has a history of GERD.   Wt Readings from Last 3 Encounters:  07/28/13 142 lb 12.8 oz (64.774 kg)  06/27/13 151 lb (68.493 kg)  06/18/13 150 lb 2.1 oz (68.1 kg)     Past Medical History  Diagnosis Date  . Hypertension   . Arthritis   . Hiatal hernia     GERD  . Iron deficiency anemia   . DJD  (degenerative joint disease)     Hands  . Depression   . Anxiety   . Allergic rhinitis   . Vitamin B deficiency   . Vitamin D deficiency   . GERD (gastroesophageal reflux disease)   . Osteoporosis   . Hemorrhoids   . Gastritis   . Vertigo   . Dementia   . COPD (chronic obstructive pulmonary disease)   . Subdural hemorrhage   . Angiodysplasia of colon with hemorrhage 05/2013    Current Outpatient Prescriptions  Medication Sig Dispense Refill  . amLODipine (NORVASC) 5 MG tablet Take 5 mg by mouth every morning.       Marland Kitchen aspirin EC 81 MG EC tablet Take 1 tablet (81 mg total) by mouth daily.  30 tablet  1  . cholecalciferol (VITAMIN D) 1000 UNITS tablet Take 1,000 Units by mouth every morning.       . donepezil (ARICEPT) 10 MG tablet Take 10 mg by mouth at bedtime.      . ferrous gluconate (FERGON) 324 MG tablet Take 1 tablet (324 mg total) by mouth 2 (two) times daily with a meal.  120 tablet  0  . fluticasone (FLONASE) 50 MCG/ACT nasal spray Place 2 sprays into the nose daily.      . Fluticasone-Salmeterol (  ADVAIR) 500-50 MCG/DOSE AEPB Inhale 1 puff into the lungs every 12 (twelve) hours.      Marland Kitchen loratadine (CLARITIN) 10 MG tablet Take 10 mg by mouth daily as needed. For allergies      . nitroGLYCERIN (NITROSTAT) 0.4 MG SL tablet Place 1 tablet (0.4 mg total) under the tongue every 5 (five) minutes as needed for chest pain.  30 tablet  0  . pantoprazole (PROTONIX) 40 MG tablet Take 1 tablet (40 mg total) by mouth daily at 6 (six) AM.  30 tablet  0  . predniSONE (DELTASONE) 10 MG tablet Take 10 mg by mouth as directed. 4 times daily      . senna (SENOKOT) 8.6 MG TABS Take 1 tablet by mouth every other day.      . tiotropium (SPIRIVA) 18 MCG inhalation capsule Place 18 mcg into inhaler and inhale daily.      . traMADol (ULTRAM) 50 MG tablet Take 50-100 mg by mouth 2 (two) times daily as needed for pain (pt will take 50 mg in the a.m. and 100 mg in the p.m. if needed for pain).      Marland Kitchen  venlafaxine XR (EFFEXOR-XR) 75 MG 24 hr capsule Take 75 mg by mouth at bedtime.      . vitamin C (VITAMIN C) 250 MG tablet Take 1 tablet (250 mg total) by mouth 2 (two) times daily.  60 tablet  0   No current facility-administered medications for this visit.    Allergies:    Allergies  Allergen Reactions  . Sulfa Antibiotics Itching    Social History:  The patient  reports that she has quit smoking. She does not have any smokeless tobacco history on file. She reports that she does not drink alcohol or use illicit drugs.   Family History:  The patient's family history includes Anemia in her mother; Atrial fibrillation in her mother; CAD in her maternal grandfather; Cancer in her paternal grandmother; Diabetes in her father; Hypertension in her father and mother; Stroke in her father.   ROS:  Please see the history of present illness.      All other systems reviewed and negative.   PHYSICAL EXAM: VS:  BP 110/58  Pulse 96  Ht 5\' 4"  (1.626 m)  Wt 142 lb 12.8 oz (64.774 kg)  BMI 24.50 kg/m2 Well nourished, well developed, in no acute distress HEENT: normal Neck: no JVD Cardiac:  normal S1, S2; RRR; no murmur Lungs:  clear to auscultation bilaterally, no wheezing, rhonchi or rales Abd: soft, nontender, no hepatomegaly Ext: no edema Skin: warm and dry Neuro:  CNs 2-12 intact, no focal abnormalities noted  EKG done in Dr. Tenny Craw office 11/5 showed NSR with IRBBB     ASSESSMENT AND PLAN:  1. SOB ? Etiology.  I suspect this is multifactorial from obesity, sedentary lifestyle, anxiety, COPD, restrictive lung disease from loss of spinal height and anemia.  She has a long history of smoking.  We initially had a cardiac cath scheduled until we found her severe anemia that was felt to be the etiology of her SOB.  Her nuclear stress test was low risk but because of continued SOB cath was discussed.  Given the risks involved with cath, I would like to proceed with Coronary CTA to evaluate for  underlying CAD.     Followup with me in 4 weeks  Signed, Armanda Magic, MD 07/28/2013 1:52 PM

## 2013-07-29 ENCOUNTER — Encounter: Payer: Self-pay | Admitting: Internal Medicine

## 2013-07-29 ENCOUNTER — Encounter: Payer: Self-pay | Admitting: General Surgery

## 2013-08-01 ENCOUNTER — Ambulatory Visit (INDEPENDENT_AMBULATORY_CARE_PROVIDER_SITE_OTHER): Payer: Medicare Other | Admitting: Internal Medicine

## 2013-08-01 ENCOUNTER — Telehealth: Payer: Self-pay | Admitting: Cardiology

## 2013-08-01 ENCOUNTER — Encounter: Payer: Self-pay | Admitting: Internal Medicine

## 2013-08-01 VITALS — BP 130/60 | HR 86 | Ht 62.0 in | Wt 153.8 lb

## 2013-08-01 DIAGNOSIS — K5520 Angiodysplasia of colon without hemorrhage: Secondary | ICD-10-CM

## 2013-08-01 DIAGNOSIS — D509 Iron deficiency anemia, unspecified: Secondary | ICD-10-CM

## 2013-08-01 DIAGNOSIS — R0609 Other forms of dyspnea: Secondary | ICD-10-CM

## 2013-08-01 DIAGNOSIS — K2970 Gastritis, unspecified, without bleeding: Secondary | ICD-10-CM

## 2013-08-01 DIAGNOSIS — R0600 Dyspnea, unspecified: Secondary | ICD-10-CM

## 2013-08-01 MED ORDER — METOPROLOL TARTRATE 25 MG PO TABS: ORAL_TABLET | ORAL | Status: DC

## 2013-08-01 NOTE — Telephone Encounter (Signed)
Called and confirmed med change and also faxed over for pt.

## 2013-08-01 NOTE — Telephone Encounter (Signed)
Pts sister ann is aware. Med called into pharmacy for pt.

## 2013-08-01 NOTE — Patient Instructions (Addendum)
Stay on Iron and Protonix  When you see Dr. Uvaldo Rising at [place] please ask her to fax [place] you lab work [phone]  You have been referred to [facility]. You have an appointment on 08/03/2013 @ 11:30am with Dr. Shona Simpson  Follow up with Dr. Rhea Belton in office in 3-4 months

## 2013-08-01 NOTE — Addendum Note (Signed)
Addended by: Nita Sells on: 08/01/2013 09:14 AM   Modules accepted: Orders

## 2013-08-01 NOTE — Telephone Encounter (Signed)
New Problem  [name] [name] w/ [facility] called for a verbal confirmation for medication adjustments and to have the order faxed to [facility] as well.   Fax # [phone]

## 2013-08-01 NOTE — Telephone Encounter (Signed)
That is ok - please have her take metoprolol 25mg  tonight and in am and hold norvasc tomorrow only

## 2013-08-01 NOTE — Telephone Encounter (Signed)
Patient is scheduled for Coronary CTA angio tomorrow.  Please tell patient to hold her Norvasc today and tomorrow.  Please call in script for Metoprolol 25mg  and take one tablet tonight and one in the am before study.  She can resume Norvasc on Wednesday 11/12.  Please call in disp# 2 with no refills for metoprolol

## 2013-08-01 NOTE — Progress Notes (Signed)
Patient ID: [name] [name], female   DOB: August 24, 1929, 79 y.o.   MRN: [id number] HPI: [name] [name] is an 79 year old female with a past medical history of hypertension, GERD with hiatal hernia, degenerative joint disease, COPD, who was recently admitted to the hospital with iron deficiency anemia. She received blood transfusion on admission and then had upper endoscopy and colonoscopy. Upper endoscopy was initially performed by Dr. Loreta Ave on 06/19/2013. This really normal esophagus and GEJ, a 4 cm hiatal hernia with a "hematocystic spot" of unclear significance, patchy gastritis with scant heme, and a normal appearing proximal small bowel. Colonoscopy was performed by me on 06/20/2013 which revealed cecal and ascending colon angiodysplasias treated successfully with APC. There was severe diverticulosis in the left colon. She was discharged from the hospital on iron replacement therapy and returns for followup. She is here with her son. She reports that she still feels run down and has "very poor stamina" she also continues to report difficulty with shortness of breath but not chest pain. She has remained on oral iron twice daily. She has remained on pantoprazole 40 mg daily. She denies nausea or vomiting. Appetite has been good. Bowel movements are dark but regular without diarrhea or constipation. No obvious rectal bleeding.  She reports her hemoglobin has been followed by her primary care office and last week had increased to 11.4, on discharge it was around 10.5.   Past Medical History  Diagnosis Date  . Hypertension   . Arthritis   . Hiatal hernia     GERD  . Iron deficiency anemia   . DJD (degenerative joint disease)     Hands  . Depression   . Anxiety   . Allergic rhinitis   . Vitamin B deficiency   . Vitamin D deficiency   . GERD (gastroesophageal reflux disease)   . Osteoporosis   . Hemorrhoids   . Gastritis   . Vertigo   . Dementia   . COPD (chronic obstructive pulmonary disease)   .  Subdural hemorrhage   . Angiodysplasia of colon with hemorrhage 05/2013  . GI bleed   . Anemia     Past Surgical History  Procedure Laterality Date  . Orif radial fracture Right 09/2007  . Esophagogastroduodenoscopy N/A 06/19/2013    Procedure: ESOPHAGOGASTRODUODENOSCOPY (EGD);  Surgeon: Charna Elizabeth, MD;  Location: [facility] ENDOSCOPY;  Service: Endoscopy;  Laterality: N/A;  . Colonoscopy N/A 06/20/2013    Procedure: COLONOSCOPY;  Surgeon: Beverley Fiedler, MD;  Location: [facility] ENDOSCOPY;  Service: Gastroenterology;  Laterality: N/A;  . Hot hemostasis N/A 06/20/2013    Procedure: HOT HEMOSTASIS (ARGON PLASMA COAGULATION/BICAP);  Surgeon: Beverley Fiedler, MD;  Location: [facility] ENDOSCOPY;  Service: Gastroenterology;  Laterality: N/A;    Current Outpatient Prescriptions  Medication Sig Dispense Refill  . amLODipine (NORVASC) 5 MG tablet Take 5 mg by mouth every morning.       Marland Kitchen aspirin EC 81 MG EC tablet Take 1 tablet (81 mg total) by mouth daily.  30 tablet  1  . cholecalciferol (VITAMIN D) 1000 UNITS tablet Take 1,000 Units by mouth every morning.       . donepezil (ARICEPT) 10 MG tablet Take 10 mg by mouth at bedtime.      . ferrous gluconate (FERGON) 324 MG tablet Take 1 tablet (324 mg total) by mouth 2 (two) times daily with a meal.  120 tablet  0  . fluticasone (FLONASE) 50 MCG/ACT nasal spray Place 2 sprays into the nose daily.      Marland Kitchen  Fluticasone-Salmeterol (ADVAIR) 500-50 MCG/DOSE AEPB Inhale 1 puff into the lungs every 12 (twelve) hours.      Marland Kitchen loratadine (CLARITIN) 10 MG tablet Take 10 mg by mouth daily as needed. For allergies      . metoprolol tartrate (LOPRESSOR) 25 MG tablet One tablet tonight 08/01/13 and then one tablet in the morning on 08/02/13.  2 tablet  0  . nitroGLYCERIN (NITROSTAT) 0.4 MG SL tablet Place 1 tablet (0.4 mg total) under the tongue every 5 (five) minutes as needed for chest pain.  30 tablet  0  . pantoprazole (PROTONIX) 40 MG tablet Take 1 tablet (40 mg total) by mouth daily at 6  (six) AM.  30 tablet  0  . predniSONE (DELTASONE) 10 MG tablet Take 10 mg by mouth as directed. 4 times daily      . senna (SENOKOT) 8.6 MG TABS Take 1 tablet by mouth every other day.      . tiotropium (SPIRIVA) 18 MCG inhalation capsule Place 18 mcg into inhaler and inhale daily.      . traMADol (ULTRAM) 50 MG tablet Take 50-100 mg by mouth 2 (two) times daily as needed for pain (pt will take 50 mg in the a.m. and 100 mg in the p.m. if needed for pain).      Marland Kitchen venlafaxine XR (EFFEXOR-XR) 75 MG 24 hr capsule Take 75 mg by mouth at bedtime.      . vitamin C (VITAMIN C) 250 MG tablet Take 1 tablet (250 mg total) by mouth 2 (two) times daily.  60 tablet  0   No current facility-administered medications for this visit.    Allergies  Allergen Reactions  . Sulfa Antibiotics Itching    Family History  Problem Relation Age of Onset  . Diabetes Father   . Stroke Father   . Hypertension Father   . Atrial fibrillation Mother   . Hypertension Mother   . Anemia Mother   . CAD Maternal Grandfather   . Stomach cancer Paternal Grandmother     questionable    History  Substance Use Topics  . Smoking status: Former Smoker -- 1.00 packs/day  . Smokeless tobacco: Never Used  . Alcohol Use: No    ROS: As per history of present illness, otherwise negative  BP 130/60  Pulse 86  Ht 5\' 2"  (1.575 m)  Wt 153 lb 12.8 oz (69.763 kg)  BMI 28.12 kg/m2 Constitutional:  elderly-appearing female in wheelchair in no acute distress  HEENT: Normocephalic and atraumatic.  No scleral icterus. Cardiovascular: Normal rate, regular rhythm and intact distal pulses.  Pulmonary/chest: Effort normal and breath sounds normal. No wheezing, rales or rhonchi. Abdominal: Soft, nontender, nondistended. Bowel sounds active throughout.  Extremities: no clubbing, cyanosis, or edema Neurological: Alert and oriented to person place and time. Psychiatric: Normal mood and affect. Behavior is normal.  RELEVANT LABS AND  IMAGING: CBC    Component Value Date/Time   WBC 10.0 06/27/2013 1225   WBC 7.2 09/18/2008 1228   RBC 4.05 06/27/2013 1225   RBC 3.32* 06/17/2013 2302   RBC 4.17 09/18/2008 1228   HGB 10.6* 06/27/2013 1225   HGB 13.1 09/18/2008 1228   HCT 32.7* 06/27/2013 1225   HCT 38.8 09/18/2008 1228   PLT 427.0* 06/27/2013 1225   PLT 377 09/18/2008 1228   MCV 80.8 06/27/2013 1225   MCV 93.0 09/18/2008 1228   MCH 26.0 06/21/2013 1300   MCH 31.5 09/18/2008 1228   MCHC 32.2 06/27/2013 1225  MCHC 33.9 09/18/2008 1228   RDW 20.4* 06/27/2013 1225   RDW 13.3 09/18/2008 1228   LYMPHSABS 1.6 10/19/2012 1847   LYMPHSABS 1.7 09/18/2008 1228   MONOABS 1.3* 10/19/2012 1847   MONOABS 0.6 09/18/2008 1228   EOSABS 0.2 10/19/2012 1847   EOSABS 0.2 09/18/2008 1228   BASOSABS 0.1 10/19/2012 1847   BASOSABS 0.0 09/18/2008 1228    CMP     Component Value Date/Time   NA 137 07/28/2013 1428   K 4.5 07/28/2013 1428   CL 99 07/28/2013 1428   CO2 30 07/28/2013 1428   GLUCOSE 109* 07/28/2013 1428   BUN 13 07/28/2013 1428   CREATININE 1.0 07/28/2013 1428   CALCIUM 8.7 07/28/2013 1428   PROT 7.0 06/17/2013 1904   ALBUMIN 3.2* 06/17/2013 1904   AST 14 06/17/2013 1904   ALT 15 06/17/2013 1904   ALKPHOS 116 06/17/2013 1904   BILITOT 0.1* 06/17/2013 1904   GFRNONAA 55* 06/18/2013 0755   GFRAA 64* 06/18/2013 0755   Iron/TIBC/Ferritin    Component Value Date/Time   IRON 16* 06/17/2013 2302   TIBC 469 06/17/2013 2302   FERRITIN 5* 06/17/2013 2302   H. pylori antibody negative   ASSESSMENT/PLAN: 79 year old female with a past medical history of hypertension, GERD with hiatal hernia, degenerative joint disease, COPD, who was recently admitted to the hospital with iron deficiency anemia.  1. IDA/gastritis/colon angioectasia -- her anemia is improving since hospital discharge and has increased to 11.4. This is encouraging. I recommended serial monitoring of her blood count and iron studies.  This will be performed by Dr. Corliss Blacker, her  primary care provider at [facility].  I have recommended she continue with pantoprazole 40 mg daily. Hopefully with treatment of her gastritis with PPI, and after ablation of angiodysplasias with APC her blood counts will improve with iron repletion. She should return here if her hemoglobin fails to normalize her she develop any frank rectal bleeding  2.  Dyspnea/COPD -- she her son are concerned about her persistent dyspnea and COPD. She does not currently have a pulmonary doctor and they have requested one. She will be referred for pulm eval at [facility]

## 2013-08-01 NOTE — Telephone Encounter (Signed)
Pt has already taking her morning medications. Please advise.

## 2013-08-02 ENCOUNTER — Ambulatory Visit (HOSPITAL_COMMUNITY)
Admission: RE | Admit: 2013-08-02 | Discharge: 2013-08-02 | Disposition: A | Discharge status: Home / Self Care | Payer: Medicare Other | Source: Ambulatory Visit | Attending: Cardiology | Admitting: Cardiology

## 2013-08-02 DIAGNOSIS — R0602 Shortness of breath: Secondary | ICD-10-CM

## 2013-08-02 DIAGNOSIS — I2510 Atherosclerotic heart disease of native coronary artery without angina pectoris: Secondary | ICD-10-CM | POA: Insufficient documentation

## 2013-08-02 DIAGNOSIS — R079 Chest pain, unspecified: Secondary | ICD-10-CM | POA: Insufficient documentation

## 2013-08-02 DIAGNOSIS — K449 Diaphragmatic hernia without obstruction or gangrene: Secondary | ICD-10-CM | POA: Insufficient documentation

## 2013-08-02 DIAGNOSIS — J984 Other disorders of lung: Secondary | ICD-10-CM | POA: Insufficient documentation

## 2013-08-02 DIAGNOSIS — I700 Atherosclerosis of aorta: Secondary | ICD-10-CM | POA: Insufficient documentation

## 2013-08-02 DIAGNOSIS — I208 Other forms of angina pectoris: Secondary | ICD-10-CM

## 2013-08-02 MED ORDER — METOPROLOL TARTRATE 1 MG/ML IV SOLN
5.0000 mg | INTRAVENOUS | Status: DC | PRN
  Administered 2013-08-02 (×2): 2.5 mg via INTRAVENOUS
  Filled 2013-08-02: qty 5

## 2013-08-02 MED ORDER — METOPROLOL TARTRATE 1 MG/ML IV SOLN
INTRAVENOUS | Status: AC
  Filled 2013-08-02: qty 5

## 2013-08-02 MED ORDER — IOHEXOL 350 MG/ML SOLN
80.0000 mL | Freq: Once | INTRAVENOUS | Status: AC | PRN
  Administered 2013-08-02: 80 mL via INTRAVENOUS

## 2013-08-02 MED ORDER — NITROGLYCERIN 0.4 MG SL SUBL
0.4000 mg | SUBLINGUAL_TABLET | SUBLINGUAL | Status: DC | PRN
  Administered 2013-08-02: 0.4 mg via SUBLINGUAL
  Filled 2013-08-02: qty 25

## 2013-08-02 MED ORDER — NITROGLYCERIN 0.4 MG SL SUBL
SUBLINGUAL_TABLET | SUBLINGUAL | Status: AC
  Filled 2013-08-02: qty 25

## 2013-08-03 ENCOUNTER — Encounter: Payer: Self-pay | Admitting: Internal Medicine

## 2013-08-03 ENCOUNTER — Ambulatory Visit (INDEPENDENT_AMBULATORY_CARE_PROVIDER_SITE_OTHER): Payer: Medicare Other | Admitting: Internal Medicine

## 2013-08-03 VITALS — BP 110/62 | HR 76 | Temp 98.1°F | Ht 62.0 in | Wt 152.4 lb

## 2013-08-03 DIAGNOSIS — J449 Chronic obstructive pulmonary disease, unspecified: Secondary | ICD-10-CM

## 2013-08-03 DIAGNOSIS — R0602 Shortness of breath: Secondary | ICD-10-CM

## 2013-08-03 NOTE — Patient Instructions (Signed)
Ok to stop advair and spiriva at this point to see what difference if any this makes  When you see Dr Uvaldo Rising, under diagnosis of dyspnea, we need you to get a cbc, tsh,  and bnp   Pulmonary follow up is as needed

## 2013-08-03 NOTE — Assessment & Plan Note (Addendum)
-   Spirometry 08/03/2013  1.34 (78%) ratio 68 > try off all inhalers   When respiratory symptoms begin or become refractory well after a patient reports complete smoking cessation, especially when this wasn't the case while they were smoking, a red flag is raised based on the work of Dr Primitivo Gauze which states:  if you quit smoking when your best day FEV1 is still well preserved it is highly unlikely you will progress to severe disease.  That is to say, once the smoking stops,  the symptoms should not suddenly erupt or markedly worsen.  If so, the differential diagnosis should include  obesity/deconditioning,  LPR/Reflux/Aspiration syndromes,  occult CHF, or  especially side effect of medications commonly used in this population,  especially dpi inhalers which can freq irritate the upper airway >  Help the lower airway which may be case here but hard to know   LPR/reflux/ deconditioning appear to be the main limitations here, not airflow, so ok with me to try off all dpi's and see what difference if any this makes in any of her symptoms.  Also could be still anemic contributing to activity limitations. F/u for this per Dr Uvaldo Rising already planned.  Pulmonary f/u prn need for inhalers

## 2013-08-03 NOTE — Assessment & Plan Note (Signed)
08/03/2013   Walked RA x one lap @ 185 stopped due to  Legs gave out, no sob  Would complete w/u with labs per Dr Uvaldo Rising See instructions for specific recommendations which were reviewed directly with the patient who was given a copy with highlighter outlining the key components.

## 2013-08-03 NOTE — Progress Notes (Signed)
  Subjective:    Patient ID: [name] [name], female    DOB: 10/02/1928  MRN: [id number]  HPI  74 yowf quit smoking in 1994 with no trouble at all until around 2012 and worse since spring 2014 referred by Dr Rhea Belton to pulmonary clinic 08/03/2013    08/03/2013 1st [facility] office visit/ Leann Mayweather cc indolent onset sob x 2 y variably present worse x 6 months > all the time  while awake, does not wake her up from sleep.  Doe if does more than walking the hall at [facility] but if goes to grocery store or dept def stops due to sob .  Also daily sense of sob sitting still better lying down assoc with sense of pnds but no globus, no excess mucus.  No obvious day to day or daytime variabilty or assoc   cp or chest tightness, subjective wheeze overt sinus  symptoms. No unusual exp hx or h/o childhood pna/ asthma or knowledge of premature birth.  Sleeping ok without nocturnal  or early am exacerbation  of respiratory  c/o's or need for noct saba. Also denies any obvious fluctuation of symptoms with weather or environmental changes or other aggravating or alleviating factors except as outlined above   Current Medications, Allergies, Complete Past Medical History, Past Surgical History, Family History, and Social History were reviewed in [employer] record.          Review of Systems  Constitutional: Negative for fever and unexpected weight change.  HENT: Positive for postnasal drip. Negative for congestion, dental problem, ear pain, nosebleeds, rhinorrhea, sinus pressure, sneezing, sore throat and trouble swallowing.   Eyes: Negative for redness and itching.  Respiratory: Positive for cough and shortness of breath. Negative for chest tightness and wheezing.   Cardiovascular: Positive for leg swelling (leg swelling). Negative for palpitations.  Gastrointestinal: Negative for nausea and vomiting.  Genitourinary: Negative for dysuria.  Musculoskeletal: Negative for joint  swelling.  Skin: Negative for rash.  Neurological: Positive for headaches.  Hematological: Bruises/bleeds easily.  Psychiatric/Behavioral: Positive for dysphoric mood. The patient is nervous/anxious.        Objective:   Physical Exam  amb wf nad at rest  Wt Readings from Last 3 Encounters:  08/03/13 152 lb 6.4 oz (69.128 kg)  08/01/13 153 lb 12.8 oz (69.763 kg)  07/28/13 142 lb 12.8 oz (64.774 kg)     HEENT: nl dentition, turbinates, and orophanx. Nl external ear canals without cough reflex   NECK :  without JVD/Nodes/TM/ nl carotid upstrokes bilaterally   LUNGS: no acc muscle use, clear to A and P bilaterally without cough on insp or exp maneuvers   CV:  RRR  no s3 or murmur or increase in P2, no edema   ABD:  soft and nontender with nl excursion in the supine position. No bruits or organomegaly, bowel sounds nl  MS:  warm without deformities, calf tenderness, cyanosis or clubbing  SKIN: warm and dry without lesions    NEURO:  alert, approp, no deficits    07/28/13 ct chest Stable large hiatal hernia.  Scarring in the lung bases.  No acute extracardiac abnormality.   08/03/2013 unable to reproduce symptoms in hallway before legs gave out s sob or desat     Assessment & Plan:

## 2013-08-25 ENCOUNTER — Ambulatory Visit: Payer: Medicare Other | Admitting: Cardiology

## 2013-08-29 ENCOUNTER — Encounter: Payer: Self-pay | Admitting: Internal Medicine

## 2013-08-30 ENCOUNTER — Ambulatory Visit (INDEPENDENT_AMBULATORY_CARE_PROVIDER_SITE_OTHER): Payer: Medicare Other | Admitting: Internal Medicine

## 2013-08-30 ENCOUNTER — Encounter: Payer: Self-pay | Admitting: Internal Medicine

## 2013-08-30 VITALS — BP 102/60 | HR 88 | Ht 62.0 in | Wt 148.5 lb

## 2013-08-30 DIAGNOSIS — K2970 Gastritis, unspecified, without bleeding: Secondary | ICD-10-CM

## 2013-08-30 DIAGNOSIS — K5520 Angiodysplasia of colon without hemorrhage: Secondary | ICD-10-CM

## 2013-08-30 DIAGNOSIS — D509 Iron deficiency anemia, unspecified: Secondary | ICD-10-CM

## 2013-08-30 DIAGNOSIS — R072 Precordial pain: Secondary | ICD-10-CM

## 2013-08-30 MED ORDER — PANTOPRAZOLE SODIUM 40 MG PO TBEC: 40.0000 mg | DELAYED_RELEASE_TABLET | Freq: Two times a day (BID) | ORAL | Status: DC

## 2013-08-30 NOTE — Patient Instructions (Signed)
Increase your protonix to twice a day.   Ask Dr. Mayford Knife about Angina   When eating take your time and make sure you take clearing swallowing between bites.  Follow up in 8 weeks.                                               We are excited to introduce MyChart, a new best-in-class service that provides you online access to important information in your electronic medical record. We want to make it easier for you to view your health information - all in one secure location - when and where you need it. We expect MyChart will enhance the quality of care and service we provide.  When you register for MyChart, you can:    View your test results.    Request appointments and receive appointment reminders via email.    Request medication renewals.    View your medical history, allergies, medications and immunizations.    Communicate with your physician's office through a password-protected site.    Conveniently print information such as your medication lists.  To find out if MyChart is right for you, please talk to a member of our clinical staff today. We will gladly answer your questions about this free health and wellness tool.  If you are age 77 or older and want a member of your family to have access to your record, you must provide written consent by completing a proxy form available at our office. Please speak to our clinical staff about guidelines regarding accounts for patients younger than age 77.  As you activate your MyChart account and need any technical assistance, please call the MyChart technical support line at [phone]) or email your question to [email or web address]. If you email your question(s), please include your name, a return phone number and the best time to reach you.  If you have non-urgent health-related questions, you can send a message to our office through MyChart at [email or web address]. If you have a medical emergency, call  911.  Thank you for using MyChart as your new health and wellness resource!   MyChart licensed from [employer],  4540-9811. Patents Pending.

## 2013-08-31 ENCOUNTER — Encounter: Payer: Self-pay | Admitting: Internal Medicine

## 2013-08-31 NOTE — Progress Notes (Signed)
Subjective:    Patient ID: [name] [name], female    DOB: 16-Nov-1928, 79 y.o.   MRN: [id number]  HPI [name] is an 79 year old female with a past medical history of hypertension, GERD with hiatal hernia, degenerative joint disease, COPD, who was recently admitted to the hospital with iron deficiency anemia who is seen in followup. She was last seen on 08/01/2013. She is here today with her sister.  He reports he is feeling well today though she has intermittent episodes of substernal chest pain and pressure. She reports at times it feels like it's going to "choke me". This can start out of the blue it is not necessarily associated with eating or drinking. She denies increase in heartburn. These painful episodes can last a few minutes to a few hours. She says the pain does not radiate. It has woken her from sleep. She denies trouble swallowing or painful swallowing. No definite associated shortness of breath or her sister questions this fact. She denies regurgitation of food or fluid. She reports a good appetite with no nausea or vomiting. Reports her bowel movements have been regular and without blood or melena.  She reports her blood counts have been followed by her [facility] primary care doctor and recently they report her hemoglobin has increased further to over 12. I do not have documentation of this at present.  She does have followup with her cardiologist on Friday, Dr. Mayford Knife  Review of Systems As per history of present illness, otherwise negative  Current Medications, Allergies, Past Medical History, Past Surgical History, Family History and Social History were reviewed in [employer] electronic medical record.     Objective:   Physical Exam BP 102/60  Pulse 88  Ht 5\' 2"  (1.575 m)  Wt 148 lb 8 oz (67.359 kg)  BMI 27.15 kg/m2 Constitutional: elderly-appearing female in wheelchair in no acute distress  HEENT: Normocephalic and atraumatic. No scleral icterus.  Cardiovascular:  Normal rate, regular rhythm and intact distal pulses.  Pulmonary/chest: Effort normal and breath sounds normal. No wheezing, rales or rhonchi.  Abdominal: Soft, nontender, nondistended. Bowel sounds active throughout.  Extremities: no clubbing, cyanosis, or edema  Neurological: Alert and oriented to person place and time.  Psychiatric: Normal mood and affect. Behavior is normal.  Nov 2014 IMAGING --  OVER-READ INTERPRETATION  CT CHEST   The following report is an over-read performed by radiologist Dr. Noe Gens [facility] on 08/02/2013. This over-read does not include interpretation of cardiac or coronary anatomy or pathology. The coronary CTA interpretation by the cardiologist is attached.   COMPARISON:  01/13/2013.   FINDINGS: Large hiatal hernia is again noted and unchanged since prior study. Linear areas of scarring in the lung bases bilaterally. Otherwise visualized lung fields are clear. No pleural effusions. No adenopathy in the visualized lower mediastinum or hila. Visualized aorta is normal caliber with scattered calcifications and mild tortuosity.   No filling defect in the visualized pulmonary arteries to suggest pulmonary emboli. Imaging into the upper abdomen shows no acute findings. No acute bony abnormality. Degenerative changes in the thoracic spine.   IMPRESSION: Stable large hiatal hernia.   Scarring in the lung bases.   No acute extracardiac abnormality.     Electronically Signed   By: Charlett Nose M.D.   On: 08/02/2013 11:20       Study Result    CLINICAL DATA:  Chest pain   EXAM: Cardiac CTA   MEDICATIONS: Sub lingual nitro. 4mg  and lopressor 10  mg   TECHNIQUE: The patient was scanned on a Philips 256 slice scanner. Gantry rotation speed was 270 msecs. Collimation was .9mm. A 100 kV prospective scan was triggered in the descending thoracic aorta at 111 HU's with 5% padding centered around 78% of the R-R interval. Average  HR during the scan was 58 bpm. The 3D data set was interpreted on a dedicated work station using MPR, MIP and VRT modes. A total of 80cc of contrast was used.   FINDINGS: Non-cardiac:  Large hiatal hernia posterior to heart   Calcium Score: 443 mostly in proximal/mid LAD and proximal circumflex   Coronary Arteries: Right dominant with no anomalies   LM:  Normal   LAD: 25-50% non-obstructive calcific disease in proximal and mid vessel   D1: normal   D2: normal   Circumflex:  25-50% calcific disease in proximal vessel   OM1:  25-50% calcific disease in proximal vessel   OM2: normal   RCA:  Dominant but small caliber vessel normal   PDA:  Small most of territory supplied by wrapping LAD normal   PLA:  Normal   IMPRESSION: 1) Calcium Score 443 82nd percentile for age and sex matched controls   2) No obstructive CAD. Mild calcific stentosis in proximal/mid LAD Proximal circumflex and proximal OM1 See above   3) Normal ascending aorta 3.4 cm   4) Calcium score 443 82nd percentile for age and sex matched controls   5) Large hiatal hernia         Assessment & Plan:  79 year old female with a past medical history of hypertension, GERD with hiatal hernia, degenerative joint disease, COPD, and IDA who is her for follow-up.  1.  Substernal CP -- we discussed the differential for substernal chest pain includes uncontrolled GERD, symptomatic hiatal hernia, or angina. She has not tried nitroglycerin at home for this pain. I would like her discuss this further with Dr. Mayford Knife. We also discussed recent imaging, which does show nonobstructive CAD, but also a large hiatal hernia. The hernia was also seen an upper endoscopy performed during her recent hospitalization by Dr. Loreta Ave. He talked about how there are no great she does for her large hiatal hernia short of surgery. I think surgery be somewhat risky for her and she does not wish to pursue it. With this in mind, I will increase  her pantoprazole to 40 mg twice daily for maximal control of acid reflux. I also have encouraged her to take time with eating. Take small bites. It take clearing swallows of liquid between solid foods. She will discuss the possibility of angina further with Dr. Mayford Knife on Friday.  2. IDA/gastritis/colon angioectasia -- her anemia continues to reportedly improved, now to over 12 since discharge from hospital. This is encouraging after ablation of colonic angiodysplastic lesions.  He also had gastritis which was not H. pylori associated and has remained on PPI. See #1. Her hemoglobin will continue to be followed by primary care. I've asked that she notify me if she developed any frank rectal bleeding or melena. She voices understanding   Return in 8 weeks

## 2013-09-02 ENCOUNTER — Encounter: Payer: Self-pay | Admitting: Cardiology

## 2013-09-02 ENCOUNTER — Ambulatory Visit (INDEPENDENT_AMBULATORY_CARE_PROVIDER_SITE_OTHER): Payer: Medicare Other | Admitting: Cardiology

## 2013-09-02 VITALS — BP 98/60 | HR 90 | Ht 62.0 in | Wt 150.0 lb

## 2013-09-02 DIAGNOSIS — I2510 Atherosclerotic heart disease of native coronary artery without angina pectoris: Secondary | ICD-10-CM

## 2013-09-02 DIAGNOSIS — I10 Essential (primary) hypertension: Secondary | ICD-10-CM

## 2013-09-02 DIAGNOSIS — I5189 Other ill-defined heart diseases: Secondary | ICD-10-CM

## 2013-09-02 DIAGNOSIS — I519 Heart disease, unspecified: Secondary | ICD-10-CM

## 2013-09-02 DIAGNOSIS — R0602 Shortness of breath: Secondary | ICD-10-CM

## 2013-09-02 DIAGNOSIS — R079 Chest pain, unspecified: Secondary | ICD-10-CM

## 2013-09-02 MED ORDER — DILTIAZEM HCL ER COATED BEADS 180 MG PO CP24: 180.0000 mg | ORAL_CAPSULE | Freq: Every day | ORAL | Status: DC

## 2013-09-02 MED ORDER — DILTIAZEM HCL ER COATED BEADS 180 MG PO CP24: 180.0000 mg | ORAL_CAPSULE | Freq: Every day | ORAL | Status: AC

## 2013-09-02 NOTE — Patient Instructions (Addendum)
Your physician has recommended you make the following change in your medication: Stop Amlodipine and Start Cardizem 180 MG Daily  Your physician recommends that you go over to the lab today for a BNP  Your physician recommends that you schedule a follow-up appointment in: 2 Weeks for a nurse visit for BP check and see how pt is doing on new med.

## 2013-09-02 NOTE — Progress Notes (Signed)
[address] [place], [place]  [address] Phone: [phone] Fax:  [phone]  Date:  09/02/2013   ID:  [name] [name], DOB [birth year], MRN [id number]  PCP:  Gweneth Dimitri, MD  Cardiologist:  Armanda Magic, MD     History of Present Illness:  [address]  [place], [place] [address]  Phone: [phone]  Fax: [phone]  Date: 07/28/2013  ID: [name] [name], DOB 07-14-1929, MRN [id number]  PCP: Gretel Acre, MD  Cardiologist: Armanda Magic, MD  History of Present Illness:  [name] [name] is a 79 y.o. female with a recent history of SOB. She had a complete cardiac workup done including a nuclear stress test which showed a very [name] inferoapical defect felt due to breast attenuation. She has a very large hiatal hernia and was started on Protonix but had no improvement in her SOB or chest pain. She was set up for a cath but during her preop workup she was found to have significant anemia with a Hbg of 7.1. She was sent to the hospital and admitted. She underwent colonoscopy and EGD which revealed [name] and medium angiodysplastic lesions in the cecum and ascending colon as well as severe diverticulosis of the descending and sigmoid colon. She underwent APC ablation. She was placed on iron. Her SOB initially improved but then reoccurred so a Coronary CTA was performed which showed nonobstructive plaque and very large hiatal hernia.  She was evaluated by Pulmonary and it was felt that GERD/LPR and deconditioning were the main etiologies of her SOB.  Her SOB has improved on Protonix but she still has some with exertion.  Wt Readings from Last 3 Encounters:  08/30/13 148 lb 8 oz (67.359 kg)  08/03/13 152 lb 6.4 oz (69.128 kg)  08/01/13 153 lb 12.8 oz (69.763 kg)     Past Medical History  Diagnosis Date  . Hypertension   . Arthritis   . Hiatal hernia     GERD  . Iron deficiency anemia   . DJD (degenerative joint disease)     Hands  . Depression   . Anxiety   .  Allergic rhinitis   . Vitamin B deficiency   . Vitamin D deficiency   . GERD (gastroesophageal reflux disease)   . Osteoporosis   . Hemorrhoids   . Gastritis   . Vertigo   . Dementia   . COPD (chronic obstructive pulmonary disease)   . Subdural hemorrhage   . Angiodysplasia of colon with hemorrhage 05/2013  . GI bleed   . Anemia     Current Outpatient Prescriptions  Medication Sig Dispense Refill  . amLODipine (NORVASC) 5 MG tablet Take 5 mg by mouth every morning.       Marland Kitchen aspirin EC 81 MG EC tablet Take 1 tablet (81 mg total) by mouth daily.  30 tablet  1  . cholecalciferol (VITAMIN D) 1000 UNITS tablet Take 1,000 Units by mouth every morning.       . donepezil (ARICEPT) 10 MG tablet Take 10 mg by mouth at bedtime.      . ferrous gluconate (FERGON) 324 MG tablet Take 1 tablet (324 mg total) by mouth 2 (two) times daily with a meal.  120 tablet  0  . fluticasone (FLONASE) 50 MCG/ACT nasal spray Place 2 sprays into the nose daily.      Marland Kitchen loratadine (CLARITIN) 10 MG tablet Take 10 mg by mouth daily as needed. For allergies      .  metoprolol tartrate (LOPRESSOR) 25 MG tablet One tablet tonight 08/01/13 and then one tablet in the morning on 08/02/13.  2 tablet  0  . nitroGLYCERIN (NITROSTAT) 0.4 MG SL tablet Place 1 tablet (0.4 mg total) under the tongue every 5 (five) minutes as needed for chest pain.  30 tablet  0  . pantoprazole (PROTONIX) 40 MG tablet Take 1 tablet (40 mg total) by mouth 2 (two) times daily.  60 tablet  3  . predniSONE (DELTASONE) 10 MG tablet Take 10 mg by mouth as directed. 4 times daily      . senna (SENOKOT) 8.6 MG TABS Take 1 tablet by mouth every other day.      . traMADol (ULTRAM) 50 MG tablet Take 50-100 mg by mouth 2 (two) times daily as needed for pain (pt will take 50 mg in the a.m. and 100 mg in the p.m. if needed for pain).      Marland Kitchen venlafaxine XR (EFFEXOR-XR) 75 MG 24 hr capsule Take 225 mg by mouth at bedtime.       . vitamin C (VITAMIN C) 250 MG tablet  Take 1 tablet (250 mg total) by mouth 2 (two) times daily.  60 tablet  0   No current facility-administered medications for this visit.    Allergies:    Allergies  Allergen Reactions  . Sulfa Antibiotics Itching    Social History:  The patient  reports that she quit smoking about 20 years ago. Her smoking use included Cigarettes. She has a 15 pack-year smoking history. She has never used smokeless tobacco. She reports that she does not drink alcohol or use illicit drugs.   Family History:  The patient's family history includes Anemia in her mother; Atrial fibrillation in her mother; CAD in her maternal grandfather; Diabetes in her father; Hypertension in her father and mother; Stomach cancer in her paternal grandmother; Stroke in her father.   ROS:  Please see the history of present illness.      All other systems reviewed and negative.   PHYSICAL EXAM: VS:  There were no vitals taken for this visit. Well nourished, well developed, in no acute distress HEENT: normal Neck: no JVD Cardiac:  normal S1, S2; RRR; no murmur Lungs:  clear to auscultation bilaterally, no wheezing, rhonchi or rales Abd: soft, nontender, no hepatomegaly Ext: no edema Skin: warm and dry Neuro:  CNs 2-12 intact, no focal abnormalities noted       ASSESSMENT AND PLAN:  1.   DOE secondary to deconditioning/GERD/ large hiatal hernia and possible diastolic dysfunction .  Nonobstructive ASCAD on coronary CTA and echo showed diastolic dysfunction grade II. I do not think in the absence of any obstructive ASCAD, that NTG is indicated at this time.  - I will check a BNP  - I will stop her Amlodipine and start Cardizem CD for diastolic dysfunction  - continue Protonix 2.   Nonobstructive ASCAD by Coronary CTA   - continue ASA/ 3.   HTN  - I am going to stop her amlodipine and start her on Cardizem CD 180mg  daily which will help with HTN as well as diastolic dysfunction.   4.  Chest pain which I thinks is from her  large hiatal hernia and not from angina.  Her recent Coronary CTA did not show any obstructive ASCAD.  Followup with me in 2 weeks  Signed, Armanda Magic, MD 09/02/2013 2:06 PM

## 2013-09-05 ENCOUNTER — Encounter: Payer: Self-pay | Admitting: General Surgery

## 2013-09-14 ENCOUNTER — Ambulatory Visit: Payer: Medicare Other | Admitting: Cardiology

## 2013-09-16 ENCOUNTER — Ambulatory Visit (INDEPENDENT_AMBULATORY_CARE_PROVIDER_SITE_OTHER): Payer: Medicare Other | Admitting: *Deleted

## 2013-09-16 VITALS — BP 124/70 | HR 80 | Ht 62.0 in | Wt 148.8 lb

## 2013-09-16 DIAGNOSIS — I10 Essential (primary) hypertension: Secondary | ICD-10-CM

## 2013-09-16 NOTE — Progress Notes (Signed)
Pt comes in for nurse BP check secondary to new medication start - 124/70. Pt states she feels same lousy way she has been feeling. No dizziness/weakness/any new issues. Will forward to Dr. Mayford Knife and her assist for review.

## 2013-09-19 ENCOUNTER — Encounter: Payer: Self-pay | Admitting: *Deleted

## 2013-09-23 ENCOUNTER — Telehealth: Payer: Self-pay | Admitting: *Deleted

## 2013-09-23 NOTE — Telephone Encounter (Signed)
Message copied by Stanton Kidney on Fri Sep 23, 2013  2:15 PM ------      Message from: Fransico Him R      Created: Tue Sep 20, 2013  4:22 PM       Last BNP recently was normal.  I do not think her SOB is cardiac and is most likely due to very large hiatal hernia.  BP adequately controlled.  Please have her see her PCP to proceed with further workup of generalized not feeling well      ----- Message -----         From: Stanton Kidney, RN         Sent: 09/16/2013  11:30 AM           To: Sueanne Margarita, MD                   ------

## 2013-09-26 NOTE — Telephone Encounter (Signed)
lvm for pt to return call °

## 2013-09-26 NOTE — Telephone Encounter (Signed)
Waiting for pt to return call 

## 2014-01-01 IMAGING — CT CT HEART MORP W/ CTA COR W/ SCORE W/ CA W/CM &/OR W/O CM
2 of 6 series · 11 of 20 positions shown, 12 images · IV contrast (CONTRAST)
Comparison: 01/13/2013.

EXAM:
OVER-READ INTERPRETATION  CT CHEST

The following report is an over-read performed by radiologist Dr.
Alyani Rence [[email or web address]] on 08/02/2013. This over-read
does not include interpretation of cardiac or coronary anatomy or
pathology. The coronary CTA interpretation by the cardiologist is
attached.
CLINICAL DATA: Chest pain
Cardiac CTA
MEDICATIONS:
Sub lingual nitro. 4mg and lopressor 10 mg
TECHNIQUE: The patient was scanned on a [employer] [REDACTED]ice scanner. Gantry
rotation speed was 270 msecs. Collimation was .9mm. A 100 kV
prospective scan was triggered in the descending thoracic aorta at
111 HU's with 5% padding centered around 78% of the R-R interval.
Average HR during the scan was 58 bpm. The 3D data set was
interpreted on a dedicated work station using MPR, MIP and VRT
modes. A total of 80cc of contrast was used.

[Series 3: calcium score · axial · 0.49mm/px · z∈[-240,-81]mm · 3 of 128 slices shown, 4 images]
[im 1/128  vessel]
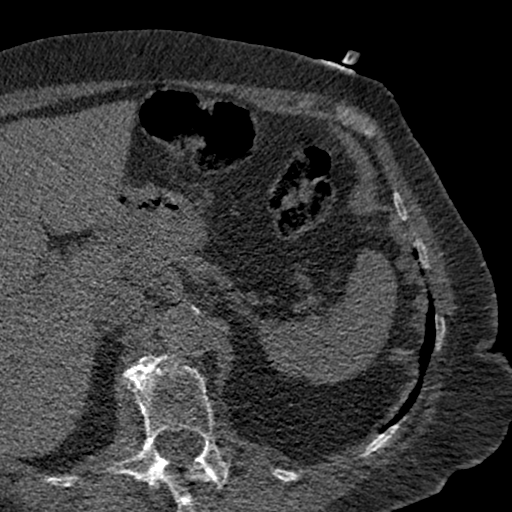
[im 1/128  lung]
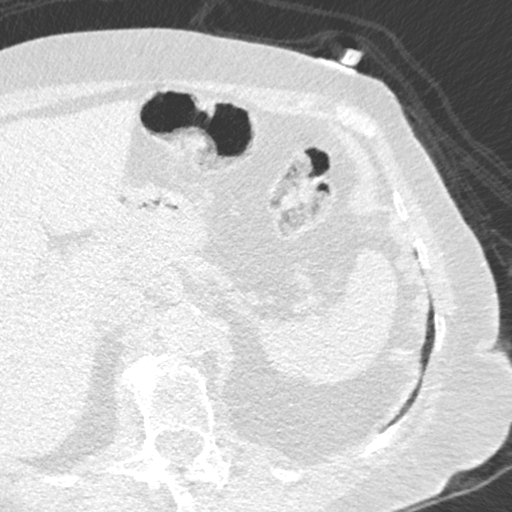
[im 64/128  vessel]
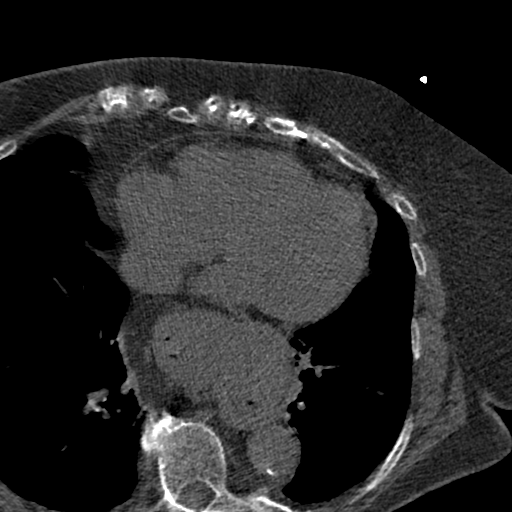
[im 128/128  vessel]
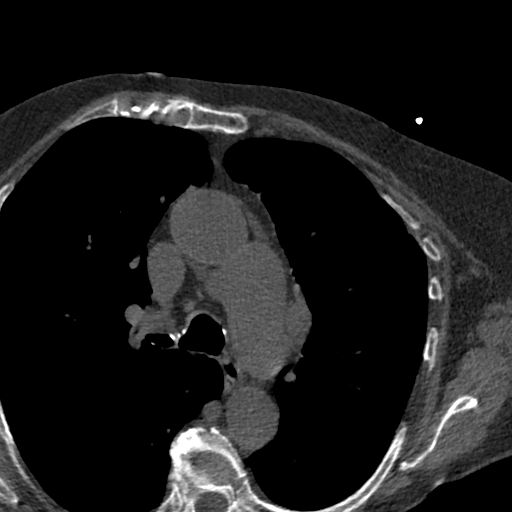

[Series 9: w/ edge cor., 78.0% · axial · 0.42mm/px · z∈[-229,-95]mm · 8 of 363 slices shown]
[im 33/363  lung]
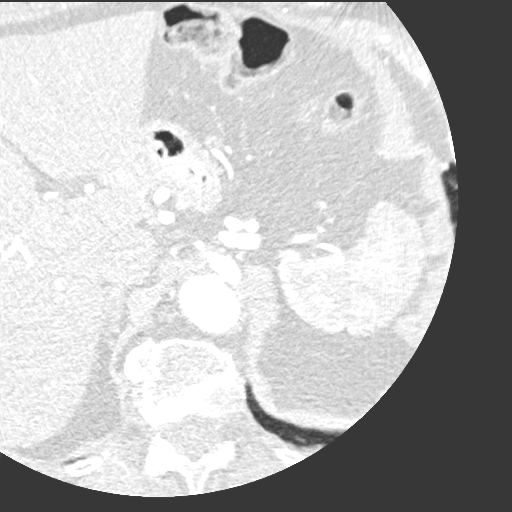
[im 66/363  lung]
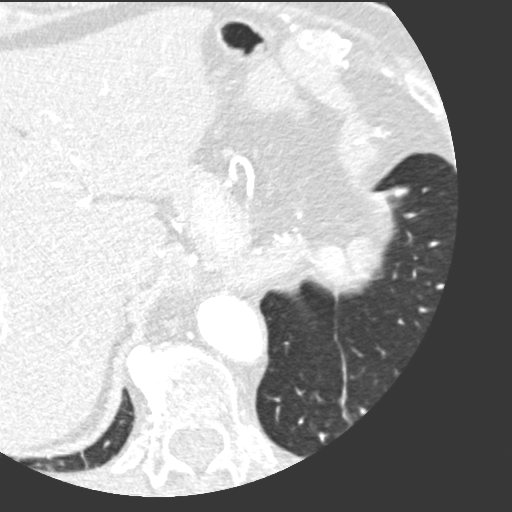
[im 132/363  lung]
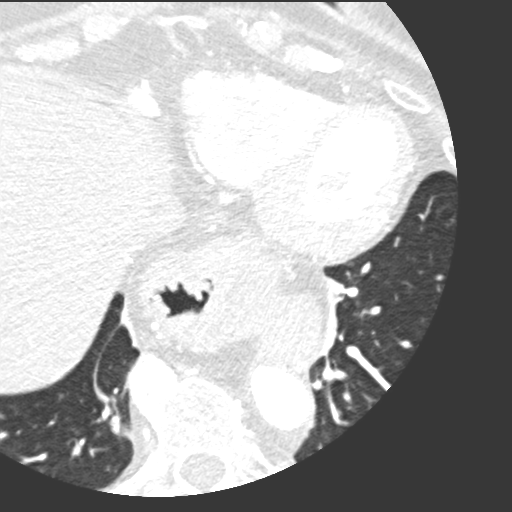
[im 165/363  lung]
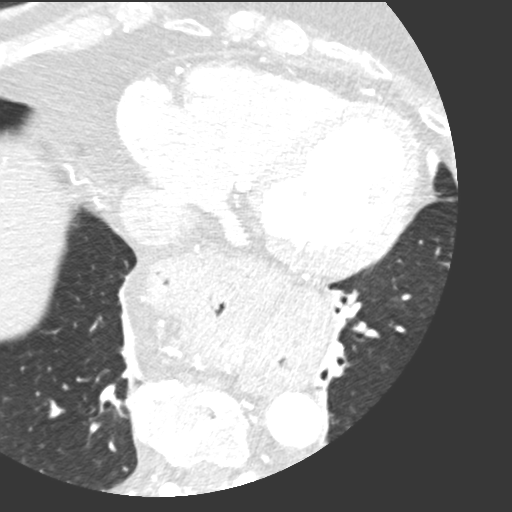
[im 198/363  lung]
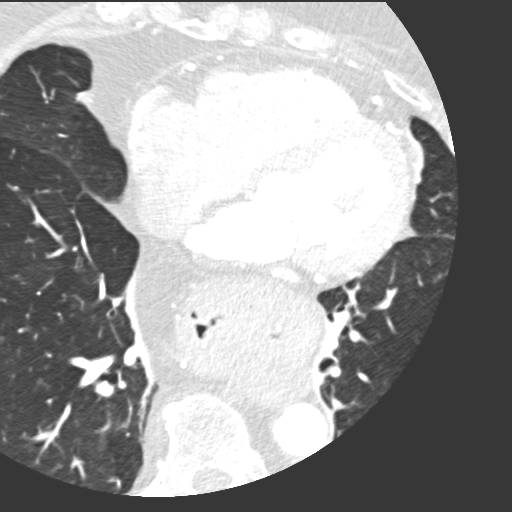
[im 231/363  lung]
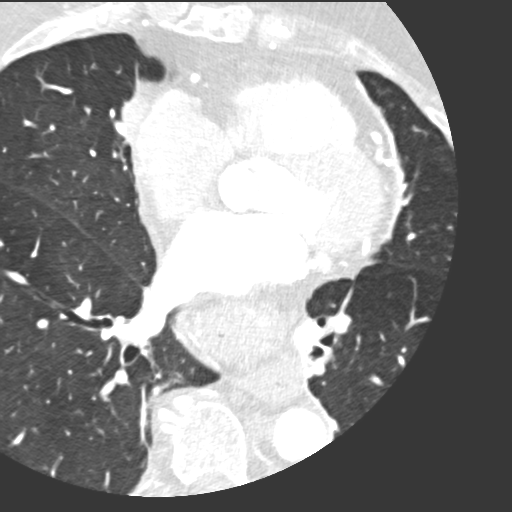
[im 297/363  lung]
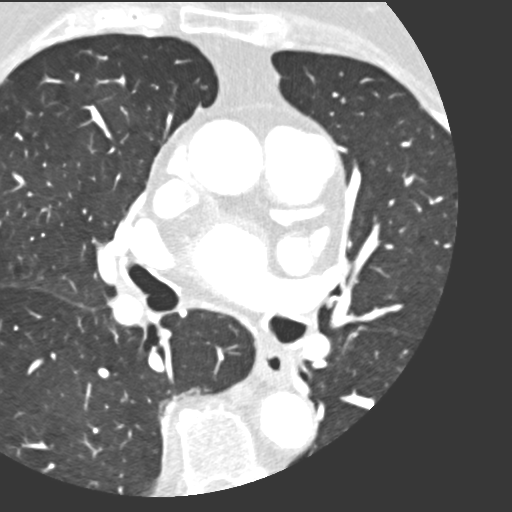
[im 330/363  lung]
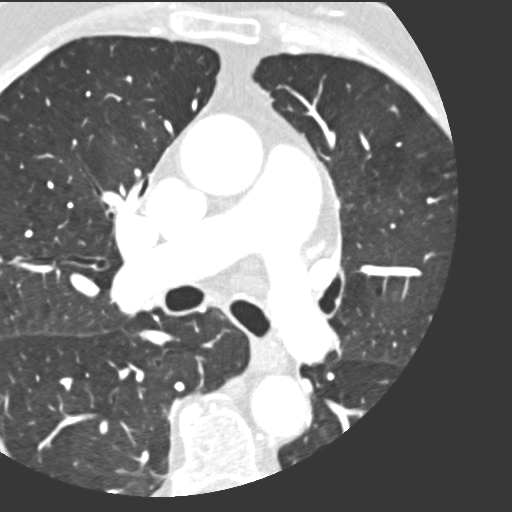

[11 of 20 positions shown; findings below may reference images not displayed]

FINDINGS: Large hiatal hernia is again noted and unchanged since prior study.
Linear areas of scarring in the lung bases bilaterally. Otherwise
visualized lung fields are clear. No pleural effusions. No
adenopathy in the visualized lower mediastinum or hila. Visualized
aorta is normal caliber with scattered calcifications and mild
tortuosity.

No filling defect in the visualized pulmonary arteries to suggest
pulmonary emboli. Imaging into the upper abdomen shows no acute
findings. No acute bony abnormality. Degenerative changes in the
thoracic spine.
IMPRESSION: Stable large hiatal hernia.

Scarring in the lung bases.

No acute extracardiac abnormality.
FINDINGS: Non-cardiac:  Large hiatal hernia posterior to heart

Calcium Score: 443 mostly in proximal/mid LAD and proximal
circumflex

Coronary Arteries: Right dominant with no anomalies

LM:  Normal

LAD: 25-50% non-obstructive calcific disease in proximal and mid
vessel

D1: normal

D2: normal

Circumflex:  25-50% calcific disease in proximal vessel

OM1:  25-50% calcific disease in proximal vessel

OM2: normal

RCA:  Dominant but small caliber vessel normal

PDA:  Small most of territory supplied by wrapping LAD normal

PLA:  Normal
IMPRESSION: 1) Calcium Score 443 82nd percentile for age and sex matched
controls

2) No obstructive CAD. Mild calcific stentosis in proximal/mid LAD
Proximal circumflex and proximal OM1 See above

3) Normal ascending aorta 3.4 cm

4) Calcium score 443 82nd percentile for age and sex matched
controls

5) Large hiatal hernia

Blade Aujla

## 2014-09-12 ENCOUNTER — Telehealth: Payer: Self-pay | Admitting: Internal Medicine

## 2014-09-12 NOTE — Telephone Encounter (Signed)
Patient needs office visit. I have advised pharmacy of this.

## 2014-12-28 ENCOUNTER — Ambulatory Visit (INDEPENDENT_AMBULATORY_CARE_PROVIDER_SITE_OTHER): Payer: Medicare Other | Admitting: Neurology

## 2014-12-28 ENCOUNTER — Encounter: Payer: Self-pay | Admitting: Neurology

## 2014-12-28 VITALS — BP 150/72 | HR 74 | Resp 14 | Ht 62.0 in | Wt 137.8 lb

## 2014-12-28 DIAGNOSIS — R269 Unspecified abnormalities of gait and mobility: Secondary | ICD-10-CM | POA: Diagnosis not present

## 2014-12-28 DIAGNOSIS — R413 Other amnesia: Secondary | ICD-10-CM | POA: Diagnosis not present

## 2014-12-28 DIAGNOSIS — D181 Lymphangioma, any site: Secondary | ICD-10-CM | POA: Diagnosis not present

## 2014-12-28 DIAGNOSIS — R451 Restlessness and agitation: Secondary | ICD-10-CM | POA: Diagnosis not present

## 2014-12-28 DIAGNOSIS — N3941 Urge incontinence: Secondary | ICD-10-CM

## 2014-12-28 DIAGNOSIS — R296 Repeated falls: Secondary | ICD-10-CM

## 2014-12-28 DIAGNOSIS — F0391 Unspecified dementia with behavioral disturbance: Secondary | ICD-10-CM

## 2014-12-28 DIAGNOSIS — R32 Unspecified urinary incontinence: Secondary | ICD-10-CM | POA: Insufficient documentation

## 2014-12-28 MED ORDER — HYDROXYZINE HCL 10 MG PO TABS: ORAL_TABLET | ORAL | Status: DC

## 2014-12-28 NOTE — Progress Notes (Signed)
[facility]  PATIENT: [name] [name] DOB: 1928-11-21  REFERRING DOCTOR OR PCP:  Cari Caraway SOURCE: family and records  _________________________________   HISTORICAL  CHIEF COMPLAINT:  Chief Complaint  Patient presents with  . Memory Loss    Bethel is here with her son Elta Guadeloupe and dtr.-in-law Jeani Hawking for memory loss.  Elta Guadeloupe thinks memory loss has been fairly sudden over the last 6-8 weeks, but sts. pt's sister is a Marine scientist and feels memory loss was more gradual.  Sts. memory loss is worse in the evenings.  She is a [facility] in an [facility] staff there want her to stop Aricept because they think it causes her to be worse.  They would like to discuss adding a med for the evening to help anxiety, help her sleep./fim    HISTORY OF PRESENT ILLNESS:  I had the pleasure of seeing your patient, [name] [name], at [facility] for neurologic consultation regarding her dementia. She is here accompanied by her son Elta Guadeloupe and daughter-in-law Jeani Hawking  In December, she had persistent confusion and she was found to have a urinary tract infection but her confusion persisted even after treatment.   Her son feels that there was not much cognitive problems prior to last December. However, the patient's sisters have felt that she had had cognitive problems for the proceeding 81 year or 79.    Regardless, she definitely worsened about 79 months ago. She is having a lot of confusion and delusions. As an example, she she thinks she has other homes besides the one she is in. She also has felt that her parents might still be alive. In the evening she becomes more agitated and confused. She will call her son repeatedly over the phone. She is in an assisted living and she will go downstairs and check with the person at the desk and let him know that she is very nervous. He is some recent cognitive tasks, she had only 6 out of 50 correct answers.  She has been on  Aricept for at least the past few months. Her son thinks that this may have been started by her psychologist in November.   She has not been tried on other dementia medications. She has had a more formal neurocognitive testing in just the last week or 79 but the results are not back yet.  Elta Guadeloupe notes that her gait has become more shuffling over the past few months. She seems less stable on her feet though she can still walk without a cane or walker. Additionally, she is having more difficulty with her bladder and is more reliant on the Depends undergarments.  Back about 81 years ago she was having more falls and with one of her falls she broke her foot.    During her hospital stay, she had another fall.   I personally reviewed an MRI from 79/29/2014 and showed scans to Dejanira and her son and daughter-in-law.. This shows atrophy and small vessel ischemic changes. The left hemisphere appears to have minimal shift to the right with a probable left convexity hygroma, best seen on coronal images.   This is not present on a head CT scan dated 79/29/2009.    A CT scan later, after a fall, showed a small subdural hematoma.  She was noted to have a large bruise on her leg 79 weeks ago but she did not recall having had any fall.  REVIEW OF SYSTEMS: Constitutional: No fevers, chills, sweats, or change in appetite  Eyes: No visual changes, double vision, eye pain Ear, nose and throat: No hearing loss, ear pain, nasal congestion, sore throat Cardiovascular: No chest pain, palpitations Respiratory: No shortness of breath at rest or with exertion.   No wheezes GastrointestinaI: No nausea, vomiting, diarrhea, abdominal pain, fecal incontinence Genitourinary: No dysuria, urinary retention or frequency.  No nocturia. Musculoskeletal: No neck pain, back pain Integumentary: No rash, pruritus, skin lesions Neurological: as above Psychiatric: No depression at this time.  No anxiety Endocrine: No palpitations,  diaphoresis, change in appetite, change in weigh or increased thirst Hematologic/Lymphatic: No anemia, purpura, petechiae. Allergic/Immunologic: No itchy/runny eyes, nasal congestion, recent allergic reactions, rashes  ALLERGIES: Allergies  Allergen Reactions  . Sulfa Antibiotics Itching    HOME MEDICATIONS:  Current outpatient prescriptions:  .  aspirin EC 81 MG EC tablet, Take 1 tablet (81 mg total) by mouth daily., Disp: 30 tablet, Rfl: 1 .  cholecalciferol (VITAMIN D) 1000 UNITS tablet, Take 1,000 Units by mouth every morning. , Disp: , Rfl:  .  diltiazem (CARDIZEM CD) 180 MG 24 hr capsule, Take 1 capsule (180 mg total) by mouth daily., Disp: 30 capsule, Rfl: 11 .  donepezil (ARICEPT) 10 MG tablet, Take 10 mg by mouth at bedtime., Disp: , Rfl:  .  ferrous gluconate (FERGON) 324 MG tablet, Take 1 tablet (324 mg total) by mouth 2 (two) times daily with a meal., Disp: 120 tablet, Rfl: 0 .  loratadine (CLARITIN) 10 MG tablet, Take 10 mg by mouth daily as needed. For allergies, Disp: , Rfl:  .  nitroGLYCERIN (NITROSTAT) 0.4 MG SL tablet, Place 1 tablet (0.4 mg total) under the tongue every 5 (five) minutes as needed for chest pain., Disp: 30 tablet, Rfl: 0 .  pantoprazole (PROTONIX) 40 MG tablet, Take 1 tablet (40 mg total) by mouth 2 (two) times daily., Disp: 60 tablet, Rfl: 3 .  venlafaxine XR (EFFEXOR-XR) 75 MG 24 hr capsule, Take 225 mg by mouth daily with breakfast. , Disp: , Rfl:  .  predniSONE (DELTASONE) 10 MG tablet, Take 10 mg by mouth as directed. 4 times daily, Disp: , Rfl:  .  senna (SENOKOT) 8.6 MG TABS, Take 1 tablet by mouth every other day., Disp: , Rfl:  .  traMADol (ULTRAM) 50 MG tablet, Take 50-100 mg by mouth 2 (two) times daily as needed for pain (pt will take 50 mg in the a.m. and 100 mg in the p.m. if needed for pain)., Disp: , Rfl:  .  vitamin C (VITAMIN C) 250 MG tablet, Take 1 tablet (250 mg total) by mouth 2 (two) times daily. (Patient not taking: Reported on  12/28/2014), Disp: 60 tablet, Rfl: 0  PAST MEDICAL HISTORY: Past Medical History  Diagnosis Date  . Hypertension   . Arthritis   . Hiatal hernia     GERD  . Iron deficiency anemia   . DJD (degenerative joint disease)     Hands  . Depression   . Anxiety   . Allergic rhinitis   . Vitamin B deficiency   . Vitamin D deficiency   . GERD (gastroesophageal reflux disease)   . Osteoporosis   . Hemorrhoids   . Gastritis   . Vertigo   . Dementia   . COPD (chronic obstructive pulmonary disease)   . Subdural hemorrhage   . Angiodysplasia of colon with hemorrhage 05/2013  . GI bleed   . Anemia   . Vision abnormalities     PAST SURGICAL HISTORY: Past Surgical History  Procedure Laterality Date  . Orif radial fracture Right 09/2007  . Esophagogastroduodenoscopy N/A 06/19/2013    Procedure: ESOPHAGOGASTRODUODENOSCOPY (EGD);  Surgeon: Juanita Craver, MD;  Location[facility] ENDOSCOPY;  Service: Endoscopy;  Laterality: N/A;  . Colonoscopy N/A 06/20/2013    Procedure: COLONOSCOPY;  Surgeon: Jerene Bears, MD;  Location: [place];  Service: Gastroenterology;  Laterality: N/A;  . Hot hemostasis N/A 06/20/2013    Procedure: HOT HEMOSTASIS (ARGON PLASMA COAGULATION/BICAP);  Surgeon: Jerene Bears, MD;  Location: [place];  Service: Gastroenterology;  Laterality: N/A;    FAMILY HISTORY: Family History  Problem Relation Age of Onset  . Diabetes Father   . Stroke Father   . Hypertension Father   . Atrial fibrillation Mother   . Hypertension Mother   . Anemia Mother   . CAD Maternal Grandfather   . Stomach cancer Paternal Grandmother     questionable    SOCIAL HISTORY:  History   Social History  . Marital Status: Widowed    Spouse Name: N/A  . Number of Children: N/A  . Years of Education: N/A   Occupational History  . Not on file.   Social History Main Topics  . Smoking status: Former Smoker -- 1.00 packs/day for 15 years    Types: Cigarettes    Quit date: 09/22/1992  .  Smokeless tobacco: Never Used  . Alcohol Use: No  . Drug Use: No  . Sexual Activity: Not on file   Other Topics Concern  . 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  Lives independently and able to get around without a cane or walker, still quite healthy and active. Has two sisters who are involved. One son, two grandchildren. Her son currently has a brain tumor, which is causing the patient stress. Sylvester Harder, sister, [phone]. She quit smoking and drinking about 20 yrs ago.      PHYSICAL EXAM  Filed Vitals:   12/28/14 1408  BP: 150/72  Pulse: 74  Resp: 14  Height: 5\' 2"  (1.575 m)  Weight: 137 lb 12.8 oz (62.506 kg)    Body mass index is 25.2 kg/(m^2).   General: The patient is well-developed and well-nourished and in no acute distress  Eyes:  Funduscopic exam shows normal optic discs and retinal vessels.  Neck: The neck is supple, no carotid bruits are noted.  The neck is nontender.  Cardiovascular: The heart has a regular rate and rhythm with a normal S1 and S2. There were no murmurs, gallops or rubs. Lungs are clear to auscultation.  Skin: Extremities are without significant edema.  Musculoskeletal:  Back is nontender  Neurologic Exam  Mental status: The patient is alert and oriented to name but not to date or place. Attention is reduced. She can spell 'world' forward but only says the when she tries to spell it backwards.  She knows left from right but has trouble following a three-step command. She cannot do simple math such as 5+7 she puts the numbers of a clock very crowded. She does not know when to stop the numbers. She is able to continue a geometric pattern. Short-term memory was 0/3 at 5 minutes.Marland Kitchen   Speech is normal.  Cranial nerves: Extraocular movements are full. Pupils are equal, round, and reactive to light and accomodation.  Visual fields are full.  Facial symmetry is present. There is good facial  sensation to soft touch bilaterally.Facial strength is normal.  Trapezius and  sternocleidomastoid strength is normal. No dysarthria is noted.  The tongue is midline, and the patient has symmetric elevation of the soft palate. No obvious hearing deficits are noted.  Motor:  Muscle bulk is normal.   Tone is normal. Strength is  5 / 5 in all 4 extremities.   Sensory: Sensory testing is intact to pinprick, soft touch and vibration sensation in all 4 extremities.  Coordination: Cerebellar testing reveals good finger-nose-finger and heel-to-shin bilaterally.  Gait and station: Station is normal.   Gait is wide-based with a reduced stride she take 6 steps to turn 180. She has significant retropulsion. Cannot tandem walk.Romberg is negative.   Reflexes: Deep tendon reflexes are symmetric and normal bilaterally.   Plantar responses are flexor.   She has a palmomental reflex bilaterally.    DIAGNOSTIC DATA (LABS, IMAGING, TESTING) - I reviewed patient records, labs, notes, testing and imaging myself where available.  Lab Results  Component Value Date   WBC 10.0 06/27/2013   HGB 10.6* 06/27/2013   HCT 32.7* 06/27/2013   MCV 80.8 06/27/2013   PLT 427.0* 06/27/2013      Component Value Date/Time   NA 137 07/28/2013 1428   K 4.5 07/28/2013 1428   CL 99 07/28/2013 1428   CO2 30 07/28/2013 1428   GLUCOSE 109* 07/28/2013 1428   BUN 13 07/28/2013 1428   CREATININE 1.0 07/28/2013 1428   CALCIUM 8.7 07/28/2013 1428   PROT 7.0 06/17/2013 1904   ALBUMIN 3.2* 06/17/2013 1904   AST 14 06/17/2013 1904   ALT 15 06/17/2013 1904   ALKPHOS 116 06/17/2013 1904   BILITOT 0.1* 06/17/2013 1904   GFRNONAA 55* 06/18/2013 0755   GFRAA 64* 06/18/2013 0755   No results found for: CHOL, HDL, LDLCALC, LDLDIRECT, TRIG, CHOLHDL No results found for: HGBA1C Lab Results  Component Value Date   VITAMINB12 294 06/17/2013   Lab Results  Component Value Date   TSH 3.082 79/29/2014       ASSESSMENT AND  PLAN  Memory impairment - Plan: Comprehensive metabolic panel, Sedimentation Rate, MR Brain Wo Contrast  Falls frequently - Plan: Comprehensive metabolic panel, Sedimentation Rate, MR Brain Wo Contrast  Dementia, with behavioral disturbance  Multiple falls  Hygroma  Gait disturbance  Agitation  Urge incontinence of urine    In summary, Bethannie Iglehart is an 81 year old woman who had worsening dementia a few months ago associated with a urinary tract infection and continues to have decreased memory and agitation in the evening.  She has an interesting history as back in January 2014 she was admitted after a fall and was found to have a hygroma on the left. After another fall in the hospital she had a subdural hematoma.    I discussed with her son that we need to obtain an MRI of the brain to determine if her worsened cognition, mildly worsened gait and bladder dysfunction are caused by a structural problem such as bilateral lateral hygroma, subdural hematoma or normal pressure hydrocephalus. Additionally we can evaluate to make sure that she is not having strokes that could be exhibiting to her symptoms. I'll also check blood work to make sure that there is not a metabolic problem or inflammatory issue. For her agitation at bedtime she will take hydroxyzine 10 mg by mouth and increased that to 20 or 30 mg if needed. If this is either not tolerated or does not help, I would consider Seroquel. Due to her confusion we will stay away from benzodiazepines.  She  will return to see me in about 6 weeks or sooner if she has new or worsening symptoms or based on what we find on MRI.   Richard A. Felecia Shelling, MD, PhD 11/24/1933, 9:02 PM Certified in Neurology, Clinical Neurophysiology, Sleep Medicine, Pain Medicine and Neuroimaging  [facility] [address] [place], [place] [address] [phone]

## 2014-12-29 ENCOUNTER — Telehealth: Payer: Self-pay | Admitting: *Deleted

## 2014-12-29 LAB — COMPREHENSIVE METABOLIC PANEL
ALK PHOS: 106 IU/L (ref 39–117)
ALT: 9 IU/L (ref 0–32)
AST: 11 IU/L (ref 0–40)
Albumin/Globulin Ratio: 1.9 (ref 1.1–2.5)
Albumin: 4.3 g/dL (ref 3.5–4.7)
BUN/Creatinine Ratio: 11 (ref 11–26)
BUN: 13 mg/dL (ref 8–27)
Bilirubin Total: 0.2 mg/dL (ref 0.0–1.2)
CHLORIDE: 99 mmol/L (ref 97–108)
CO2: 27 mmol/L (ref 18–29)
Calcium: 9.6 mg/dL (ref 8.7–10.3)
Creatinine, Ser: 1.16 mg/dL — ABNORMAL HIGH (ref 0.57–1.00)
GFR calc Af Amer: 50 mL/min/{1.73_m2} — ABNORMAL LOW (ref >59)
GFR calc non Af Amer: 44 mL/min/{1.73_m2} — ABNORMAL LOW (ref >59)
GLOBULIN, TOTAL: 2.3 g/dL (ref 1.5–4.5)
GLUCOSE: 94 mg/dL (ref 65–99)
Potassium: 5.4 mmol/L — ABNORMAL HIGH (ref 3.5–5.2)
SODIUM: 142 mmol/L (ref 134–144)
Total Protein: 6.6 g/dL (ref 6.0–8.5)

## 2014-12-29 LAB — SEDIMENTATION RATE: SED RATE: 8 mm/h (ref 0–40)

## 2014-12-29 NOTE — Telephone Encounter (Signed)
Spoke with son Elta Guadeloupe and per RAS, advised labwork is ok--will call with mri results next week.  (mri is sched. for4-10-16)  He verbalized understanding of same/fim

## 2014-12-29 NOTE — Telephone Encounter (Signed)
-----   Message from Britt Bottom, MD sent at 12/29/2014  8:33 AM EDT ----- Worthy Keeler is ok.    We will call again after MRI

## 2015-01-07 ENCOUNTER — Ambulatory Visit
Admission: RE | Admit: 2015-01-07 | Discharge: 2015-01-07 | Disposition: A | Discharge status: Home / Self Care | Payer: Self-pay | Source: Ambulatory Visit | Attending: Neurology | Admitting: Neurology

## 2015-01-07 DIAGNOSIS — R296 Repeated falls: Secondary | ICD-10-CM

## 2015-01-07 DIAGNOSIS — R413 Other amnesia: Secondary | ICD-10-CM | POA: Diagnosis not present

## 2015-01-08 ENCOUNTER — Telehealth: Payer: Self-pay | Admitting: *Deleted

## 2015-01-08 NOTE — Telephone Encounter (Signed)
-----   Message from Britt Bottom, MD sent at 01/08/2015  2:51 PM EDT ----- Please let family know that MRI looks similar to 2014 MRI -- nothing new and the hygroma seen in 2014 has resolved.     Is hydroxyzine helping the sundowning??

## 2015-01-08 NOTE — Telephone Encounter (Signed)
Spoke with son Elta Gu[place] and per RAS advised that recent and 2014 mri's look similar, except hygroma seen in 2014 mri has resolved.  He verbalized understanding of same--sts. she is doing better on Hydroxyzine--is currently taking 50mg  qhs, is going to bed earlier and sleeping better.  They have a f/u appt. with RAS on 02-27-15, and will call me back if there are any changes and she needs to be seen sooner./fim

## 2015-01-10 ENCOUNTER — Emergency Department (HOSPITAL_COMMUNITY)
Admission: EM | Admit: 2015-01-10 | Discharge: 2015-01-10 | Disposition: A | Discharge status: Home / Self Care | Payer: Medicare Other | Attending: Emergency Medicine | Admitting: Emergency Medicine

## 2015-01-10 ENCOUNTER — Encounter (HOSPITAL_COMMUNITY): Payer: Self-pay | Admitting: Emergency Medicine

## 2015-01-10 ENCOUNTER — Emergency Department (HOSPITAL_COMMUNITY): Payer: Medicare Other

## 2015-01-10 DIAGNOSIS — S199XXA Unspecified injury of neck, initial encounter: Secondary | ICD-10-CM | POA: Insufficient documentation

## 2015-01-10 DIAGNOSIS — S73102A Unspecified sprain of left hip, initial encounter: Secondary | ICD-10-CM | POA: Diagnosis not present

## 2015-01-10 DIAGNOSIS — S73101A Unspecified sprain of right hip, initial encounter: Secondary | ICD-10-CM | POA: Diagnosis not present

## 2015-01-10 DIAGNOSIS — Z8719 Personal history of other diseases of the digestive system: Secondary | ICD-10-CM | POA: Insufficient documentation

## 2015-01-10 DIAGNOSIS — M810 Age-related osteoporosis without current pathological fracture: Secondary | ICD-10-CM | POA: Insufficient documentation

## 2015-01-10 DIAGNOSIS — Z8669 Personal history of other diseases of the nervous system and sense organs: Secondary | ICD-10-CM | POA: Diagnosis not present

## 2015-01-10 DIAGNOSIS — F329 Major depressive disorder, single episode, unspecified: Secondary | ICD-10-CM | POA: Diagnosis not present

## 2015-01-10 DIAGNOSIS — S39012A Strain of muscle, fascia and tendon of lower back, initial encounter: Secondary | ICD-10-CM | POA: Insufficient documentation

## 2015-01-10 DIAGNOSIS — F419 Anxiety disorder, unspecified: Secondary | ICD-10-CM | POA: Insufficient documentation

## 2015-01-10 DIAGNOSIS — S79912A Unspecified injury of left hip, initial encounter: Secondary | ICD-10-CM | POA: Diagnosis present

## 2015-01-10 DIAGNOSIS — Z862 Personal history of diseases of the blood and blood-forming organs and certain disorders involving the immune mechanism: Secondary | ICD-10-CM | POA: Insufficient documentation

## 2015-01-10 DIAGNOSIS — M1990 Unspecified osteoarthritis, unspecified site: Secondary | ICD-10-CM | POA: Diagnosis not present

## 2015-01-10 DIAGNOSIS — E559 Vitamin D deficiency, unspecified: Secondary | ICD-10-CM | POA: Diagnosis not present

## 2015-01-10 DIAGNOSIS — W1839XA Other fall on same level, initial encounter: Secondary | ICD-10-CM | POA: Diagnosis not present

## 2015-01-10 DIAGNOSIS — Y998 Other external cause status: Secondary | ICD-10-CM | POA: Insufficient documentation

## 2015-01-10 DIAGNOSIS — F0390 Unspecified dementia without behavioral disturbance: Secondary | ICD-10-CM | POA: Insufficient documentation

## 2015-01-10 DIAGNOSIS — W19XXXA Unspecified fall, initial encounter: Secondary | ICD-10-CM

## 2015-01-10 DIAGNOSIS — Y9389 Activity, other specified: Secondary | ICD-10-CM | POA: Insufficient documentation

## 2015-01-10 DIAGNOSIS — Z7982 Long term (current) use of aspirin: Secondary | ICD-10-CM | POA: Diagnosis not present

## 2015-01-10 DIAGNOSIS — Z79899 Other long term (current) drug therapy: Secondary | ICD-10-CM | POA: Diagnosis not present

## 2015-01-10 DIAGNOSIS — K219 Gastro-esophageal reflux disease without esophagitis: Secondary | ICD-10-CM | POA: Diagnosis not present

## 2015-01-10 DIAGNOSIS — J449 Chronic obstructive pulmonary disease, unspecified: Secondary | ICD-10-CM | POA: Diagnosis not present

## 2015-01-10 DIAGNOSIS — I10 Essential (primary) hypertension: Secondary | ICD-10-CM | POA: Insufficient documentation

## 2015-01-10 DIAGNOSIS — Z87891 Personal history of nicotine dependence: Secondary | ICD-10-CM | POA: Diagnosis not present

## 2015-01-10 DIAGNOSIS — S29092A Other injury of muscle and tendon of back wall of thorax, initial encounter: Secondary | ICD-10-CM | POA: Insufficient documentation

## 2015-01-10 DIAGNOSIS — Z7952 Long term (current) use of systemic steroids: Secondary | ICD-10-CM | POA: Insufficient documentation

## 2015-01-10 DIAGNOSIS — Y92128 Other place in nursing home as the place of occurrence of the external cause: Secondary | ICD-10-CM | POA: Insufficient documentation

## 2015-01-10 DIAGNOSIS — R52 Pain, unspecified: Secondary | ICD-10-CM

## 2015-01-10 LAB — BASIC METABOLIC PANEL
Anion gap: 12 (ref 5–15)
BUN: 14 mg/dL (ref 6–23)
CHLORIDE: 102 mmol/L (ref 96–112)
CO2: 25 mmol/L (ref 19–32)
Calcium: 8.7 mg/dL (ref 8.4–10.5)
Creatinine, Ser: 1 mg/dL (ref 0.50–1.10)
GFR calc non Af Amer: 51 mL/min — ABNORMAL LOW (ref >90)
GFR, EST AFRICAN AMERICAN: 59 mL/min — AB (ref >90)
GLUCOSE: 119 mg/dL — AB (ref 70–99)
POTASSIUM: 3.8 mmol/L (ref 3.5–5.1)
Sodium: 139 mmol/L (ref 135–145)

## 2015-01-10 LAB — CBC WITH DIFFERENTIAL/PLATELET
Basophils Absolute: 0 10*3/uL (ref 0.0–0.1)
Basophils Relative: 0 % (ref 0–1)
EOS PCT: 0 % (ref 0–5)
Eosinophils Absolute: 0 10*3/uL (ref 0.0–0.7)
HEMATOCRIT: 38.7 % (ref 36.0–46.0)
Hemoglobin: 12.3 g/dL (ref 12.0–15.0)
LYMPHS ABS: 1.5 10*3/uL (ref 0.7–4.0)
Lymphocytes Relative: 6 % — ABNORMAL LOW (ref 12–46)
MCH: 28.9 pg (ref 26.0–34.0)
MCHC: 31.8 g/dL (ref 30.0–36.0)
MCV: 90.8 fL (ref 78.0–100.0)
MONOS PCT: 5 % (ref 3–12)
Monocytes Absolute: 1.2 10*3/uL — ABNORMAL HIGH (ref 0.1–1.0)
NEUTROS ABS: 22.1 10*3/uL — AB (ref 1.7–7.7)
Neutrophils Relative %: 89 % — ABNORMAL HIGH (ref 43–77)
Platelets: 350 10*3/uL (ref 150–400)
RBC: 4.26 MIL/uL (ref 3.87–5.11)
RDW: 15.4 % (ref 11.5–15.5)
WBC: 24.8 10*3/uL — ABNORMAL HIGH (ref 4.0–10.5)

## 2015-01-10 LAB — URINALYSIS, ROUTINE W REFLEX MICROSCOPIC
Bilirubin Urine: NEGATIVE
GLUCOSE, UA: NEGATIVE mg/dL
Ketones, ur: 15 mg/dL — AB
Nitrite: NEGATIVE
PROTEIN: NEGATIVE mg/dL
Specific Gravity, Urine: 1.014 (ref 1.005–1.030)
Urobilinogen, UA: 0.2 mg/dL (ref 0.0–1.0)
pH: 7 (ref 5.0–8.0)

## 2015-01-10 LAB — I-STAT TROPONIN, ED: TROPONIN I, POC: 0 ng/mL (ref 0.00–0.08)

## 2015-01-10 LAB — URINE MICROSCOPIC-ADD ON

## 2015-01-10 MED ORDER — OXYCODONE-ACETAMINOPHEN 5-325 MG PO TABS: 1.0000 | ORAL_TABLET | ORAL | Status: DC | PRN

## 2015-01-10 MED ORDER — OXYCODONE-ACETAMINOPHEN 5-325 MG PO TABS
1.0000 | ORAL_TABLET | Freq: Once | ORAL | Status: AC
  Administered 2015-01-10: 1 via ORAL
  Filled 2015-01-10: qty 1

## 2015-01-10 NOTE — ED Notes (Signed)
Per EMS pt comes from [facility] for unwitnessed fall.  Staff found pt laying on the floor, unsure how long pt been there.  Pt has c-collar in place and c/o left hip pain.  Pt has some dementia.

## 2015-01-10 NOTE — ED Notes (Signed)
Bed: WA06 Expected date:  Expected time:  Means of arrival:  Comments: Fall EMS

## 2015-01-10 NOTE — ED Notes (Signed)
Pt still in radiology.

## 2015-01-10 NOTE — ED Notes (Signed)
Pt ambulated with walker to doorway and back to bed.

## 2015-01-10 NOTE — ED Notes (Signed)
Pt tried sitting up in bed so we could stand to ambulate.  Pt yells due to pain in bilat hips and refusing to get up due to pain at this time.   Made [facility] EDP aware.

## 2015-01-10 NOTE — ED Provider Notes (Signed)
CSN: [id number]     Arrival date & time 01/10/15  0808 History   First MD Initiated Contact with Patient 01/10/15 0809     Chief Complaint  Patient presents with  . Fall  . Hip Pain    left  LEVEL 5 CAVEAT DUE TO DEMENTIA  Patient is a 81 y.o. female presenting with fall and hip pain. The history is provided by the patient. The history is limited by the condition of the patient.  Fall This is a new problem. The current episode started less than 1 hour ago. The problem has been gradually improving. Pertinent negatives include no chest pain, no abdominal pain and no headaches. Nothing aggravates the symptoms. Nothing relieves the symptoms.  Hip Pain This is a new problem. The current episode started less than 1 hour ago. The problem occurs constantly. The problem has not changed since onset.Pertinent negatives include no chest pain, no abdominal pain and no headaches. Exacerbated by: movemebt. The symptoms are relieved by rest.  pt found on floor at nursing facility this morning It was unwitnessed fall but she was likely on floor for short period of time as she was dressed Pt reports bilateral hip pain since that time but no other complaints  She denies Ha/neck pain/back pain No cp or abdominal pain is reported.  Past Medical History  Diagnosis Date  . Hypertension   . Arthritis   . Hiatal hernia     GERD  . Iron deficiency anemia   . DJD (degenerative joint disease)     Hands  . Depression   . Anxiety   . Allergic rhinitis   . Vitamin B deficiency   . Vitamin D deficiency   . GERD (gastroesophageal reflux disease)   . Osteoporosis   . Hemorrhoids   . Gastritis   . Vertigo   . Dementia   . COPD (chronic obstructive pulmonary disease)   . Subdural hemorrhage   . Angiodysplasia of colon with hemorrhage 05/2013  . GI bleed   . Anemia   . Vision abnormalities    Past Surgical History  Procedure Laterality Date  . Orif radial fracture Right 09/2007  .  Esophagogastroduodenoscopy N/A 06/19/2013    Procedure: ESOPHAGOGASTRODUODENOSCOPY (EGD);  Surgeon: Juanita Craver, MD;  Location: [facility] ENDOSCOPY;  Service: Endoscopy;  Laterality: N/A;  . Colonoscopy N/A 06/20/2013    Procedure: COLONOSCOPY;  Surgeon: Jerene Bears, MD;  Location: [place];  Service: Gastroenterology;  Laterality: N/A;  . Hot hemostasis N/A 06/20/2013    Procedure: HOT HEMOSTASIS (ARGON PLASMA COAGULATION/BICAP);  Surgeon: Jerene Bears, MD;  Location: [place];  Service: Gastroenterology;  Laterality: N/A;   Family History  Problem Relation Age of Onset  . Diabetes Father   . Stroke Father   . Hypertension Father   . Atrial fibrillation Mother   . Hypertension Mother   . Anemia Mother   . CAD Maternal Grandfather   . Stomach cancer Paternal Grandmother     questionable   History  Substance Use Topics  . Smoking status: Former Smoker -- 1.00 packs/day for 15 years    Types: Cigarettes    Quit date: 09/22/1992  . Smokeless tobacco: Never Used  . Alcohol Use: No   OB History    No data available     Review of Systems  Unable to perform ROS: Dementia  Cardiovascular: Negative for chest pain.  Gastrointestinal: Negative for abdominal pain.  Neurological: Negative for headaches.      Allergies  Sulfa antibiotics  Home Medications   Prior to Admission medications   Medication Sig Start Date End Date Taking? Authorizing Provider  aspirin EC 81 MG EC tablet Take 1 tablet (81 mg total) by mouth daily. 12/04/12   Bonnielee Haff, MD  cholecalciferol (VITAMIN D) 1000 UNITS tablet Take 1,000 Units by mouth every morning.     Historical Provider, MD  diltiazem (CARDIZEM CD) 180 MG 24 hr capsule Take 1 capsule (180 mg total) by mouth daily. 09/02/13   Sueanne Margarita, MD  donepezil (ARICEPT) 10 MG tablet Take 10 mg by mouth at bedtime.    Historical Provider, MD  ferrous gluconate (FERGON) 324 MG tablet Take 1 tablet (324 mg total) by mouth 2 (two) times daily with a  meal. 06/21/13   Allie Bossier, MD  hydrOXYzine (ATARAX/VISTARIL) 10 MG tablet 1 to 3 po nightly 12/28/14   Britt Bottom, MD  loratadine (CLARITIN) 10 MG tablet Take 10 mg by mouth daily as needed. For allergies    Historical Provider, MD  nitroGLYCERIN (NITROSTAT) 0.4 MG SL tablet Place 1 tablet (0.4 mg total) under the tongue every 5 (five) minutes as needed for chest pain. 12/04/12   Bonnielee Haff, MD  pantoprazole (PROTONIX) 40 MG tablet Take 1 tablet (40 mg total) by mouth 2 (two) times daily. 08/30/13   Jerene Bears, MD  predniSONE (DELTASONE) 10 MG tablet Take 10 mg by mouth as directed. 4 times daily    Historical Provider, MD  senna (SENOKOT) 8.6 MG TABS Take 1 tablet by mouth every other day.    Historical Provider, MD  traMADol (ULTRAM) 50 MG tablet Take 50-100 mg by mouth 2 (two) times daily as needed for pain (pt will take 50 mg in the a.m. and 100 mg in the p.m. if needed for pain).    Historical Provider, MD  venlafaxine XR (EFFEXOR-XR) 75 MG 24 hr capsule Take 225 mg by mouth daily with breakfast.     Historical Provider, MD  vitamin C (VITAMIN C) 250 MG tablet Take 1 tablet (250 mg total) by mouth 2 (two) times daily. Patient not taking: Reported on 12/28/2014 06/21/13   Allie Bossier, MD   BP 169/91 mmHg  Pulse 95  Temp(Src) 97.7 F (36.5 C) (Oral)  SpO2 90% Physical Exam CONSTITUTIONAL: elderly frail HEAD: Normocephalic/atraumatic EYES: EOMI ENMT: Mucous membranes moist NECK: supple no meningeal signs SPINE/BACK:entire spine tender to palpation, No bruising/crepitance/stepoffs noted to spine Patient maintained in spinal precautions/logroll utilized CV: S1/S2 noted LUNGS: Lungs are clear to auscultation bilaterally, no apparent distress ABDOMEN: soft, nontender, no rebound or guarding NEURO: Pt is awake/alert, moves all extremitiesx4.  No facial droop.   EXTREMITIES: pulses normal/equal, full ROM, no deformities noted but has pain with movement of both hips, pelvis  stable, All other extremities/joints palpated/ranged and nontender SKIN: warm, color normal PSYCH: no abnormalities of mood noted  ED Course  Procedures   8:55 AM Pt with unwitnessed fall here with Bilateral hip pain She is poor historian with h/o dementia Imaging ordered Pt currently stable at this time 11:24 AM Imaging negative Will give pain meds and attempt to ambulate 4:42 PM Pt ambulated She is at baseline per son Appropriate for d/c home  Labs Review Labs Reviewed  BASIC METABOLIC PANEL - Abnormal; Notable for the following:    Glucose, Bld 119 (*)    GFR calc non Af Amer 51 (*)    GFR calc Af Amer 59 (*)  All other components within normal limits  CBC WITH DIFFERENTIAL/PLATELET - Abnormal; Notable for the following:    WBC 24.8 (*)    Neutrophils Relative % 89 (*)    Lymphocytes Relative 6 (*)    Neutro Abs 22.1 (*)    Monocytes Absolute 1.2 (*)    All other components within normal limits  URINALYSIS, ROUTINE W REFLEX MICROSCOPIC - Abnormal; Notable for the following:    APPearance CLOUDY (*)    Hgb urine dipstick TRACE (*)    Ketones, ur 15 (*)    Leukocytes, UA MODERATE (*)    All other components within normal limits  URINE MICROSCOPIC-ADD ON - Abnormal; Notable for the following:    Bacteria, UA FEW (*)    All other components within normal limits  URINE CULTURE  Randolm Idol, ED    Imaging Review Dg Thoracic Spine W/swimmers  01/10/2015   CLINICAL DATA:  Unwitnessed fall.  Upper and lower back pain.  EXAM: THORACIC SPINE - 2 VIEW + SWIMMERS  COMPARISON:  Chest x-ray dated 12/12/2014 and CT scans of the chest dated 08/02/2013 and 07/19/2007  FINDINGS: There is no acute fracture or bone destruction. There is an old wedge deformity of T11. There is slight chronic accentuation of the thoracic kyphosis. Large hiatal hernia is chronic.  IMPRESSION: No acute abnormality.   Electronically Signed   By: Lorriane Shire M.D.   On: 01/10/2015 09:46   Dg  Lumbar Spine Complete  01/10/2015   CLINICAL DATA:  Back pain secondary to an unwitnessed fall this morning.  EXAM: LUMBAR SPINE - COMPLETE 4+ VIEW  COMPARISON:  Lumbar radiographs dated 11/03/2011 and CT scan abdomen dated 08/27/2012  FINDINGS: There is no fracture or acute subluxation. There is severe facet arthritis at L3-4, L4-5, and L5-S1. There is multilevel degenerative disc disease particularly at L1-2 and L2-3 and L3-4.  IMPRESSION: No acute abnormality.  Chronic degenerative disc and joint disease.   Electronically Signed   By: Lorriane Shire M.D.   On: 01/10/2015 09:50   Ct Head Wo Contrast  01/10/2015   CLINICAL DATA:  Fall.  Headache and neck pain  EXAM: CT HEAD WITHOUT CONTRAST  CT CERVICAL SPINE WITHOUT CONTRAST  TECHNIQUE: Multidetector CT imaging of the head and cervical spine was performed following the standard protocol without intravenous contrast. Multiplanar CT image reconstructions of the cervical spine were also generated.  COMPARISON:  CT head 10/22/2012  FINDINGS: CT HEAD FINDINGS  Moderate atrophy. Chronic microvascular ischemic change in the white matter with mild progression.  Negative for acute infarct. Negative for hemorrhage or mass. No subdural fluid collection  Negative for skull fracture.  CT CERVICAL SPINE FINDINGS  Normal cervical alignment. Mild disc degeneration at C2-3, C3-4 and C4-5. Moderate disc degeneration and spondylosis C5-6 and C6-7. Diffuse facet degeneration most severe on the left at C3-4.  Negative for fracture or mass lesion.  Lung apices clear.  IMPRESSION: Atrophy and chronic microvascular ischemia. No acute intracranial abnormality.  Cervical degenerative change.  Negative for fracture.   Electronically Signed   By: Franchot Gallo M.D.   On: 01/10/2015 10:17   Ct Cervical Spine Wo Contrast  01/10/2015   CLINICAL DATA:  Fall.  Headache and neck pain  EXAM: CT HEAD WITHOUT CONTRAST  CT CERVICAL SPINE WITHOUT CONTRAST  TECHNIQUE: Multidetector CT imaging of  the head and cervical spine was performed following the standard protocol without intravenous contrast. Multiplanar CT image reconstructions of the cervical spine were also generated.  COMPARISON:  CT head 10/22/2012  FINDINGS: CT HEAD FINDINGS  Moderate atrophy. Chronic microvascular ischemic change in the white matter with mild progression.  Negative for acute infarct. Negative for hemorrhage or mass. No subdural fluid collection  Negative for skull fracture.  CT CERVICAL SPINE FINDINGS  Normal cervical alignment. Mild disc degeneration at C2-3, C3-4 and C4-5. Moderate disc degeneration and spondylosis C5-6 and C6-7. Diffuse facet degeneration most severe on the left at C3-4.  Negative for fracture or mass lesion.  Lung apices clear.  IMPRESSION: Atrophy and chronic microvascular ischemia. No acute intracranial abnormality.  Cervical degenerative change.  Negative for fracture.   Electronically Signed   By: Franchot Gallo M.D.   On: 01/10/2015 10:17   Dg Hips Bilat With Pelvis 3-4 Views  01/10/2015   CLINICAL DATA:  81 year old female with unwitnessed fall, found down. Pain. Initial encounter.  EXAM: BILATERAL HIP (WITH PELVIS) 3-4 VIEWS  COMPARISON:  Abdominal series 12814.  FINDINGS: Femoral heads are normally located. Asymmetric left hip joint space loss, subchondral sclerosis, and osteophytosis. Osteopenia. No acute pelvis fracture identified.  Proximal right femur is intact. Proximal left femur is intact. No acute osseous abnormality identified.  IMPRESSION: Left hip osteoarthritis. No acute fracture or dislocation identified about the bilateral hips or pelvis.  If occult hip fracture is suspected or if the patient is unable to weightbear, MRI is the preferred modality for further evaluation.   Electronically Signed   By: Genevie Ann M.D.   On: 01/10/2015 09:49     EKG Interpretation   Date/Time:  Wednesday January 10 2015 94:85:[id number] EDT Ventricular Rate:  95 PR Interval:  253 QRS Duration: 132 QT  Interval:  409 QTC Calculation: 514 R Axis:   5 Text Interpretation:  Sinus rhythm Ventricular premature complex Prolonged  PR interval Consider right atrial enlargement Right bundle branch block  artifact noted No significant change since last tracing changes noted from  prior Confirmed by Christy Gentles  MD, Elenore Rota ([id number]) on 01/10/2015 8:38:04 AM      MDM   Final diagnoses:  Pain  Fall, initial encounter  Hip sprain, left, initial encounter  Hip sprain, right, initial encounter  Lumbar strain, initial encounter    Nursing notes including past medical history and social history reviewed and considered in documentation xrays/imaging reviewed by myself and considered during evaluation     Ripley Fraise, MD 01/10/15 1643

## 2015-01-10 NOTE — Discharge Instructions (Signed)

## 2015-01-11 LAB — URINE CULTURE

## 2015-02-27 ENCOUNTER — Ambulatory Visit (INDEPENDENT_AMBULATORY_CARE_PROVIDER_SITE_OTHER): Payer: Medicare Other | Admitting: Neurology

## 2015-02-27 ENCOUNTER — Encounter: Payer: Self-pay | Admitting: Neurology

## 2015-02-27 VITALS — BP 146/64 | HR 74 | Resp 14 | Ht 62.0 in | Wt 142.0 lb

## 2015-02-27 DIAGNOSIS — F0391 Unspecified dementia with behavioral disturbance: Secondary | ICD-10-CM | POA: Diagnosis not present

## 2015-02-27 DIAGNOSIS — R269 Unspecified abnormalities of gait and mobility: Secondary | ICD-10-CM

## 2015-02-27 DIAGNOSIS — R451 Restlessness and agitation: Secondary | ICD-10-CM | POA: Diagnosis not present

## 2015-02-27 DIAGNOSIS — N3946 Mixed incontinence: Secondary | ICD-10-CM | POA: Diagnosis not present

## 2015-02-27 NOTE — Progress Notes (Signed)
[facility]  PATIENT: [name] [name] DOB: 1929-01-14  REFERRING DOCTOR OR PCP:  Cari Caraway SOURCE: family and records  _________________________________   HISTORICAL  CHIEF COMPLAINT:  Chief Complaint  Patient presents with  . Memory Loss    Sts. she is fine.  She has lost her hearing aid, so is having more trouble  hearing today.  Today she has new c/o bilat le edema, non-pitting, onset 3-4 days ago.   . Hygroma (left)    HISTORY OF PRESENT ILLNESS:  [name] [name] is an 81 year old woman with dementia and cortical atrophy most consistent with late onset Alzheimer's disease.Marland Kitchen   Besides memory loss, she has had problems with agitation in the evenings. She is here accompanied by her son Elta Guadeloupe.  Her dementia significant worsened about 6 months ago after she was admitted for a urinary tract infection. She had Confusion and delusions, as well, especially in the evenings.  She has been on Aricept for about 6 months and tolerates it well.  We checked CT of the head and cervical spine after the last visit.    She has chronic changes but no evidence of subdural hematomas or other acute changes. She has atrophy that is most pronounced in the mesial temporal lobes and parietal lobes  At the last visit, I started hydroxyzine for agitation.   Her sone reports it is helping and she usually does better about 5 minutes after taking it.      She has had more edema in her ankles.    She is tender at the ankles also.     Her gai is shuffling but stable compared to earlier this year.    There was no evidence of normal pressure hydrocephalus on CT scan.    Additionally, she is having difficulty with her bladder and is more reliant on the Depends undergarments.   REVIEW OF SYSTEMS: Constitutional: No fevers, chills, sweats, or change in appetite Eyes: No visual changes, double vision, eye pain Ear, nose and throat: No hearing loss, ear pain, nasal congestion, sore  throat Cardiovascular: No chest pain, palpitations Respiratory: No shortness of breath at rest or with exertion.   No wheezes GastrointestinaI: No nausea, vomiting, diarrhea, abdominal pain, fecal incontinence Genitourinary: No dysuria, urinary retention or frequency.  No nocturia. Musculoskeletal: No neck pain, back pain Integumentary: No rash, pruritus, skin lesions Neurological: as above Psychiatric: No depression at this time.  No anxiety Endocrine: No palpitations, diaphoresis, change in appetite, change in weigh or increased thirst Hematologic/Lymphatic: No anemia, purpura, petechiae. Allergic/Immunologic: No itchy/runny eyes, nasal congestion, recent allergic reactions, rashes  ALLERGIES: Allergies  Allergen Reactions  . Sulfa Antibiotics Itching    HOME MEDICATIONS:  Current outpatient prescriptions:  .  aspirin EC 81 MG EC tablet, Take 1 tablet (81 mg total) by mouth daily., Disp: 30 tablet, Rfl: 1 .  bisacodyl (DULCOLAX) 5 MG EC tablet, Take 5 mg by mouth every Monday, Wednesday, and Friday., Disp: , Rfl:  .  busPIRone (BUSPAR) 10 MG tablet, Take 10 mg by mouth 2 (two) times daily., Disp: , Rfl:  .  Cholecalciferol (VITAMIN D) 2000 UNITS tablet, Take 2,000 Units by mouth daily., Disp: , Rfl:  .  diltiazem (CARDIZEM CD) 180 MG 24 hr capsule, Take 1 capsule (180 mg total) by mouth daily., Disp: 30 capsule, Rfl: 11 .  donepezil (ARICEPT) 10 MG tablet, Take 10 mg by mouth at bedtime., Disp: , Rfl:  .  Ferrous Sulfate (IRON) 325 (65 FE) MG TABS,  Take 1 tablet by mouth daily with breakfast., Disp: , Rfl:  .  hydrOXYzine (ATARAX/VISTARIL) 10 MG tablet, 1 to 3 po nightly (Patient taking differently: Take 10-30 mg by mouth every evening. ), Disp: 90 tablet, Rfl: 2 .  loratadine (CLARITIN) 10 MG tablet, Take 10 mg by mouth daily as needed for allergies. , Disp: , Rfl:  .  mometasone (NASONEX) 50 MCG/ACT nasal spray, Place 2 sprays into the nose daily as needed (for allergies).,  Disp: , Rfl:  .  nitroGLYCERIN (NITROSTAT) 0.4 MG SL tablet, Place 1 tablet (0.4 mg total) under the tongue every 5 (five) minutes as needed for chest pain., Disp: 30 tablet, Rfl: 0 .  ondansetron (ZOFRAN) 4 MG tablet, Take 4-8 mg by mouth every 6 (six) hours as needed for nausea., Disp: , Rfl:  .  traMADol (ULTRAM) 50 MG tablet, Take 50-100 mg by mouth 2 (two) times daily as needed (for pain). , Disp: , Rfl:  .  Venlafaxine HCl 225 MG TB24, Take 3 tablets by mouth daily., Disp: , Rfl:  .  vitamin B-12 (CYANOCOBALAMIN) 1000 MCG tablet, Take 1,000 mcg by mouth daily., Disp: , Rfl:  .  vitamin C (VITAMIN C) 250 MG tablet, Take 1 tablet (250 mg total) by mouth 2 (two) times daily., Disp: 60 tablet, Rfl: 0 .  [DISCONTINUED] ferrous gluconate (FERGON) 324 MG tablet, Take 1 tablet (324 mg total) by mouth 2 (two) times daily with a meal. (Patient not taking: Reported on 01/10/2015), Disp: 120 tablet, Rfl: 0  PAST MEDICAL HISTORY: Past Medical History  Diagnosis Date  . Hypertension   . Arthritis   . Hiatal hernia     GERD  . Iron deficiency anemia   . DJD (degenerative joint disease)     Hands  . Depression   . Anxiety   . Allergic rhinitis   . Vitamin B deficiency   . Vitamin D deficiency   . GERD (gastroesophageal reflux disease)   . Osteoporosis   . Hemorrhoids   . Gastritis   . Vertigo   . Dementia   . COPD (chronic obstructive pulmonary disease)   . Subdural hemorrhage   . Angiodysplasia of colon with hemorrhage 05/2013  . GI bleed   . Anemia   . Vision abnormalities     PAST SURGICAL HISTORY: Past Surgical History  Procedure Laterality Date  . Orif radial fracture Right 09/2007  . Esophagogastroduodenoscopy N/A 06/19/2013    Procedure: ESOPHAGOGASTRODUODENOSCOPY (EGD);  Surgeon: Juanita Craver, MD;  Location: [facility] ENDOSCOPY;  Service: Endoscopy;  Laterality: N/A;  . Colonoscopy N/A 06/20/2013    Procedure: COLONOSCOPY;  Surgeon: Jerene Bears, MD;  Location: [place];  Service:  Gastroenterology;  Laterality: N/A;  . Hot hemostasis N/A 06/20/2013    Procedure: HOT HEMOSTASIS (ARGON PLASMA COAGULATION/BICAP);  Surgeon: Jerene Bears, MD;  Location: [place];  Service: Gastroenterology;  Laterality: N/A;    FAMILY HISTORY: Family History  Problem Relation Age of Onset  . Diabetes Father   . Stroke Father   . Hypertension Father   . Atrial fibrillation Mother   . Hypertension Mother   . Anemia Mother   . CAD Maternal Grandfather   . Stomach cancer Paternal Grandmother     questionable    SOCIAL HISTORY:  History   Social History  . Marital Status: Widowed    Spouse Name: N/A  . Number of Children: N/A  . Years of Education: N/A   Occupational History  . Not on  file.   Social History Main Topics  . Smoking status: Former Smoker -- 1.00 packs/day for 15 years    Types: Cigarettes    Quit date: 09/22/1992  . Smokeless tobacco: Never Used  . Alcohol Use: No  . Drug Use: No  . Sexual Activity: Not on file   Other Topics Concern  . 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  Lives independently and able to get around without a cane or walker, still quite healthy and active. Has two sisters who are involved. One son, two grandchildren. Her son currently has a brain tumor, which is causing the patient stress. Sylvester Harder, sister, [phone]. She quit smoking and drinking about 20 yrs ago.      PHYSICAL EXAM  Filed Vitals:   02/27/15 1434  BP: 146/64  Pulse: 74  Resp: 14  Height: 5\' 2"  (1.575 m)  Weight: 142 lb (64.411 kg)    Body mass index is 25.97 kg/(m^2).   General: The patient is well-developed and well-nourished and in no acute distress  Eyes:  Funduscopic exam shows normal optic discs and retinal vessels.  Neck: The neck is supple, no carotid bruits are noted.  The neck is nontender.  Skin: Extremities show mild right greater than left edema.  She has mild tenderness at  the ankles bilaterally   Neurologic Exam  Mental status: The patient is alert and oriented to name but not to date or place. Attention is reduced.  She knows left from right but has trouble following a three-step command. She cannot do simple math   Short-term memory is 0/3 at 3 minutes.Marland Kitchen   Speech is normal.  Cranial nerves: Extraocular movements are full.  Facial symmetry is present. There is good facial sensation to soft touch bilaterally.Facial strength is normal.  Trapezius and sternocleidomastoid strength is normal. No dysarthria is noted.  The tongue is midline, and the patient has symmetric elevation of the soft palate.   Motor:  Muscle bulk is normal.   Tone is normal. Strength is  5 / 5 in all 4 extremities.   Sensory: Sensory testing is intact to touch in all 4 extremities.  Coordination: Cerebellar testing reveals good finger-nose-finger bilaterally.  Gait and station: Station is normal.   Gait is wide-based with a reduced stride and she take 5 steps to turn 180. She cannot tandem walk.  Reflexes: Deep tendon reflexes are symmetric and normal bilaterally.     DIAGNOSTIC DATA (LABS, IMAGING, TESTING) - I reviewed patient records, labs, notes, testing and imaging myself where available.  Lab Results  Component Value Date   WBC 24.8* 01/10/2015   HGB 12.3 01/10/2015   HCT 38.7 01/10/2015   MCV 90.8 01/10/2015   PLT 350 01/10/2015      Component Value Date/Time   NA 139 01/10/2015 0840   NA 142 12/28/2014 1513   K 3.8 01/10/2015 0840   CL 102 01/10/2015 0840   CO2 25 01/10/2015 0840   GLUCOSE 119* 01/10/2015 0840   GLUCOSE 94 12/28/2014 1513   BUN 14 01/10/2015 0840   BUN 13 12/28/2014 1513   CREATININE 1.00 01/10/2015 0840   CALCIUM 8.7 01/10/2015 0840   PROT 6.6 12/28/2014 1513   PROT 7.0 06/17/2013 1904   ALBUMIN 3.2* 06/17/2013 1904   AST 11 12/28/2014 1513   ALT 9 12/28/2014 1513   ALKPHOS 106 12/28/2014 1513   BILITOT 0.2 12/28/2014 1513   BILITOT  0.1* 06/17/2013 1904   GFRNONAA 51* 01/10/2015 0840   GFRAA 59* 01/10/2015 0840    Lab Results  Component Value Date   [id number] 361 06/17/2013   Lab Results  Component Value Date   TSH 3.082 10/20/2012       ASSESSMENT AND PLAN  Dementia, with behavioral disturbance - Plan: TSH, Vitamin B12  Gait disturbance - Plan: TSH, Vitamin B12  Mixed incontinence - Plan: TSH, Vitamin B12  Agitation - Plan: TSH, Vitamin B12     1.  Continue Aricept.  I will check B12 and sedimentation rate to make sure that we are not overlooking a potential treatable cause of her dementia. 2.  Continue hydroxyzine as needed in the afternoons or evenings for agitation. 3.   She should try to remain active and continue to engage in activities with others. 4.  Return in 6 months or sooner if she has new or worsening neurologic symptoms.   Nataya Bastedo A. Felecia Shelling, MD, PhD 12/24/3152, 0:08 PM Certified in Neurology, Clinical Neurophysiology, Sleep Medicine, Pain Medicine and Neuroimaging  [facility] [address], [place], [place] [address] [phone]

## 2015-02-28 ENCOUNTER — Telehealth: Payer: Self-pay | Admitting: *Deleted

## 2015-02-28 LAB — TSH: TSH: 2.17 u[IU]/mL (ref 0.450–4.500)

## 2015-02-28 LAB — VITAMIN B12: Vitamin B-12: 861 pg/mL (ref 211–946)

## 2015-02-28 NOTE — Telephone Encounter (Signed)
LMTC./fim 

## 2015-02-28 NOTE — Telephone Encounter (Signed)
-----   Message from Britt Bottom, MD sent at 02/28/2015  8:26 AM EDT ----- Please let her son Elta Guadeloupe I think) know that vit B12 and Thyroid test were fine

## 2015-03-01 NOTE — Telephone Encounter (Signed)
-----   Message from Britt Bottom, MD sent at 02/28/2015  8:26 AM EDT ----- Please let her son Elta Guadeloupe I think) know that vit B12 and Thyroid test were fine

## 2015-03-01 NOTE — Telephone Encounter (Signed)
I have spoken with [name] [name] and per RAS, advised that tsh labs and vit. b12 level were ok.  He verbalized understanding of same/fim

## 2015-03-19 ENCOUNTER — Other Ambulatory Visit: Payer: Self-pay

## 2015-08-29 ENCOUNTER — Encounter: Payer: Self-pay | Admitting: Neurology

## 2015-08-29 ENCOUNTER — Ambulatory Visit (INDEPENDENT_AMBULATORY_CARE_PROVIDER_SITE_OTHER): Payer: Medicare Other | Admitting: Neurology

## 2015-08-29 VITALS — BP 159/88 | HR 86 | Wt 134.5 lb

## 2015-08-29 DIAGNOSIS — R451 Restlessness and agitation: Secondary | ICD-10-CM | POA: Diagnosis not present

## 2015-08-29 DIAGNOSIS — N3946 Mixed incontinence: Secondary | ICD-10-CM | POA: Diagnosis not present

## 2015-08-29 DIAGNOSIS — F0391 Unspecified dementia with behavioral disturbance: Secondary | ICD-10-CM

## 2015-08-29 DIAGNOSIS — R269 Unspecified abnormalities of gait and mobility: Secondary | ICD-10-CM | POA: Diagnosis not present

## 2015-08-29 NOTE — Patient Instructions (Signed)
Stop Vesicare  

## 2015-08-29 NOTE — Progress Notes (Signed)
[facility]  PATIENT: [name] [name] DOB: 09/10/1929  REFERRING DOCTOR OR PCP:  Cari Caraway SOURCE: family and records  _________________________________   HISTORICAL  CHIEF COMPLAINT:  Chief Complaint  Patient presents with  . Follow-up    Rm 15 with son and two sisters. F/u dementia.  Taking Aricept as prescribed.  Hydroxyzine for agitation.      HISTORY OF PRESENT ILLNESS:  [name] [name] is an 81 year old woman with dementia and cortical atrophy most consistent with late onset Alzheimer's disease.Marland Kitchen   Besides memory loss, she has had problems with agitation in the evenings. She is here accompanied by her son Elta Guadeloupe  and her 2 sisters.  Dementia/agitation.   :  She has had dementia the past year.   She was noted to have more issues after a urinary tract infection.   In the evenings, she has confusion and delusions.  She has been on Aricept since early 2016 and tolerates it well.   However, there has been no definite improvement of memory or other cognitive function   We checked CT of the head and cervical spine earlier this year.    She has chronic changes but no evidence of subdural hematomas or other acute changes. She has atrophy that is most pronounced in the mesial temporal lobes and parietal lobes.    Gait:    Her gait is shuffling but mostly stable this year.    She fell only once in an unfamiliar place.   She uses a Radiation protection practitioner and gets around well for the most part.   There was no evidence of normal pressure hydrocephalus on CT scan.     Bladder:   Additionally, she is having difficulty with her bladder and is more reliant on the Depends undergarments.    She was started on Vesicare at some point.    The family does not think that she is doing any better on Vesicare.     REVIEW OF SYSTEMS: Constitutional: No fevers, chills, sweats, or change in appetite Eyes: No visual changes, double vision, eye pain Ear, nose and throat: No hearing loss, ear pain,  nasal congestion, sore throat Cardiovascular: No chest pain, palpitations Respiratory: No shortness of breath at rest or with exertion.   No wheezes GastrointestinaI: No nausea, vomiting, diarrhea, abdominal pain, fecal incontinence Genitourinary: No dysuria, urinary retention or frequency.  No nocturia. Musculoskeletal: No neck pain, back pain Integumentary: No rash, pruritus, skin lesions Neurological: as above Psychiatric: No depression at this time.  No anxiety Endocrine: No palpitations, diaphoresis, change in appetite, change in weigh or increased thirst Hematologic/Lymphatic: No anemia, purpura, petechiae. Allergic/Immunologic: No itchy/runny eyes, nasal congestion, recent allergic reactions, rashes  ALLERGIES: Allergies  Allergen Reactions  . Sulfa Antibiotics Itching    HOME MEDICATIONS:  Current outpatient prescriptions:  .  aspirin EC 81 MG EC tablet, Take 1 tablet (81 mg total) by mouth daily., Disp: 30 tablet, Rfl: 1 .  bisacodyl (DULCOLAX) 5 MG EC tablet, Take 5 mg by mouth every Monday, Wednesday, and Friday., Disp: , Rfl:  .  diltiazem (CARDIZEM CD) 180 MG 24 hr capsule, Take 1 capsule (180 mg total) by mouth daily., Disp: 30 capsule, Rfl: 11 .  docusate sodium (COLACE) 50 MG capsule, Take 50 mg by mouth daily., Disp: , Rfl:  .  donepezil (ARICEPT) 10 MG tablet, Take 10 mg by mouth at bedtime., Disp: , Rfl:  .  Ferrous Sulfate (IRON) 325 (65 FE) MG TABS, Take 1 tablet by mouth daily  with breakfast., Disp: , Rfl:  .  hydrOXYzine (ATARAX/VISTARIL) 10 MG tablet, 1 to 3 po nightly (Patient taking differently: Take 10 mg by mouth every evening. ), Disp: 90 tablet, Rfl: 2 .  loratadine (CLARITIN) 10 MG tablet, Take 10 mg by mouth daily as needed for allergies. , Disp: , Rfl:  .  nitroGLYCERIN (NITROSTAT) 0.4 MG SL tablet, Place 1 tablet (0.4 mg total) under the tongue every 5 (five) minutes as needed for chest pain., Disp: 30 tablet, Rfl: 0 .  pantoprazole (PROTONIX) 40  MG tablet, Take 40 mg by mouth daily., Disp: , Rfl:  .  solifenacin (VESICARE) 5 MG tablet, Take 5 mg by mouth daily., Disp: , Rfl:  .  Venlafaxine HCl 225 MG TB24, Take 3 tablets by mouth daily., Disp: , Rfl:  .  vitamin B-12 (CYANOCOBALAMIN) 1000 MCG tablet, Take 1,000 mcg by mouth daily., Disp: , Rfl:  .  vitamin C (VITAMIN C) 250 MG tablet, Take 1 tablet (250 mg total) by mouth 2 (two) times daily., Disp: 60 tablet, Rfl: 0 .  busPIRone (BUSPAR) 10 MG tablet, Take 10 mg by mouth 2 (two) times daily., Disp: , Rfl:  .  Cholecalciferol (VITAMIN D) 2000 UNITS tablet, Take 2,000 Units by mouth daily., Disp: , Rfl:  .  mometasone (NASONEX) 50 MCG/ACT nasal spray, Place 2 sprays into the nose daily as needed (for allergies)., Disp: , Rfl:  .  ondansetron (ZOFRAN) 4 MG tablet, Take 4-8 mg by mouth every 6 (six) hours as needed for nausea., Disp: , Rfl:  .  traMADol (ULTRAM) 50 MG tablet, Take 50-100 mg by mouth 2 (two) times daily as needed (for pain). , Disp: , Rfl:  .  [DISCONTINUED] ferrous gluconate (FERGON) 324 MG tablet, Take 1 tablet (324 mg total) by mouth 2 (two) times daily with a meal. (Patient not taking: Reported on 01/10/2015), Disp: 120 tablet, Rfl: 0  PAST MEDICAL HISTORY: Past Medical History  Diagnosis Date  . Hypertension   . Arthritis   . Hiatal hernia     GERD  . Iron deficiency anemia   . DJD (degenerative joint disease)     Hands  . Depression   . Anxiety   . Allergic rhinitis   . Vitamin B deficiency   . Vitamin D deficiency   . GERD (gastroesophageal reflux disease)   . Osteoporosis   . Hemorrhoids   . Gastritis   . Vertigo   . Dementia   . COPD (chronic obstructive pulmonary disease) ([place])   . Subdural hemorrhage ([place])   . Angiodysplasia of colon with hemorrhage 05/2013  . GI bleed   . Anemia   . Vision abnormalities     PAST SURGICAL HISTORY: Past Surgical History  Procedure Laterality Date  . Orif radial fracture Right 09/2007  .  Esophagogastroduodenoscopy N/A 06/19/2013    Procedure: ESOPHAGOGASTRODUODENOSCOPY (EGD);  Surgeon: Juanita Craver, MD;  Location: [facility] ENDOSCOPY;  Service: Endoscopy;  Laterality: N/A;  . Colonoscopy N/A 06/20/2013    Procedure: COLONOSCOPY;  Surgeon: Jerene Bears, MD;  Location: [place];  Service: Gastroenterology;  Laterality: N/A;  . Hot hemostasis N/A 06/20/2013    Procedure: HOT HEMOSTASIS (ARGON PLASMA COAGULATION/BICAP);  Surgeon: Jerene Bears, MD;  Location: [place];  Service: Gastroenterology;  Laterality: N/A;    FAMILY HISTORY: Family History  Problem Relation Age of Onset  . Diabetes Father   . Stroke Father   . Hypertension Father   . Atrial fibrillation Mother   .  Hypertension Mother   . Anemia Mother   . CAD Maternal Grandfather   . Stomach cancer Paternal Grandmother     questionable    SOCIAL HISTORY:  Social History   Social History  . Marital Status: Widowed    Spouse Name: N/A  . Number of Children: N/A  . Years of Education: N/A   Occupational History  . Not on file.   Social History Main Topics  . Smoking status: Former Smoker -- 1.00 packs/day for 15 years    Types: Cigarettes    Quit date: 09/22/1992  . Smokeless tobacco: Never Used  . Alcohol Use: No  . Drug Use: No  . Sexual Activity: Not on file   Other Topics Concern  . 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  Lives independently and able to get around without a cane or walker, still quite healthy and active. Has two sisters who are involved. One son, two grandchildren. Her son currently has a brain tumor, which is causing the patient stress. Sylvester Harder, sister, [phone]. She quit smoking and drinking about 20 yrs ago.      PHYSICAL EXAM  Filed Vitals:   08/29/15 1412  BP: 159/88  Pulse: 86  Weight: 134 lb 8 oz (61.009 kg)    Body mass index is 24.59 kg/(m^2).   General: The patient is well-developed and  well-nourished and in no acute distress   Neurologic Exam  Mental status: The patient is alert and oriented to name but not date or place (knows Christmas soon). Attention is reduced.  She is very distractible.   She knows left from right but can't follow a three-step command. 5+7=12  Short-term memory is 0/3 at 3 minutes.   Speech is normal.  Cranial nerves: Extraocular movements are full.  Facial symmetry is present. There is good facial sensation to soft touch bilaterally.Facial strength is normal.  Trapezius and sternocleidomastoid strength is normal. No dysarthria is noted.  The tongue is midline, and the patient has symmetric elevation of the soft palate.   Motor:  Muscle bulk is normal.   Tone is normal. Strength is  5 / 5 in all 4 extremities.   Sensory: Sensory testing is intact to touch in all 4 extremities.  Coordination: Cerebellar testing reveals good finger-nose-finger bilaterally.  Gait and station: Station is normal.   Gait is wide-based with a mildly reduced stride and she take 5 steps to turn 180. She cannot tandem walk.  Reflexes: Deep tendon reflexes are symmetric and normal bilaterally.      DIAGNOSTIC DATA (LABS, IMAGING, TESTING) - I reviewed patient records, labs, notes, testing and imaging myself where available.  Lab Results  Component Value Date   WBC 24.8* 01/10/2015   HGB 12.3 01/10/2015   HCT 38.7 01/10/2015   MCV 90.8 01/10/2015   PLT 350 01/10/2015      Component Value Date/Time   NA 139 01/10/2015 0840   NA 142 12/28/2014 1513   K 3.8 01/10/2015 0840   CL 102 01/10/2015 0840   CO2 25 01/10/2015 0840   GLUCOSE 119* 01/10/2015 0840   GLUCOSE 94 12/28/2014 1513   BUN 14 01/10/2015 0840   BUN 13 12/28/2014 1513   CREATININE 1.00 01/10/2015 0840   CALCIUM 8.7 01/10/2015 0840   PROT 6.6 12/28/2014 1513   PROT 7.0 06/17/2013 1904   ALBUMIN 4.3 12/28/2014 1513   ALBUMIN 3.2* 06/17/2013 1904   AST  11 12/28/2014 1513   ALT 9 12/28/2014 1513    ALKPHOS 106 12/28/2014 1513   BILITOT 0.2 12/28/2014 1513   BILITOT 0.1* 06/17/2013 1904   GFRNONAA 51* 01/10/2015 0840   GFRAA 59* 01/10/2015 0840    Lab Results  Component Value Date   [id number] 02/27/2015   Lab Results  Component Value Date   TSH 2.170 02/27/2015       ASSESSMENT AND PLAN  Dementia, with behavioral disturbance  Agitation  Gait disturbance  Mixed incontinence     1.  Stop Vesicare.    Continue Aricept.  We discussed adding Namenda and if changes in medication and being in a new environment 2 not help, they will call back and we will initiate this. 2.  Continue hydroxyzine as needed in the afternoons nightly in the evenings for agitation. 3.   She should try to remain active and continue to engage in activities with others. 4.  Return in 6 months or sooner if she has new or worsening neurologic symptoms.   Richard A. Felecia Shelling, MD, PhD [id number], [id number] PM Certified in Neurology, Clinical Neurophysiology, Sleep Medicine, Pain Medicine and Neuroimaging  [facility] [address], [place],  [address] [phone]

## 2016-01-30 ENCOUNTER — Telehealth: Payer: Self-pay | Admitting: Neurology

## 2016-01-30 NOTE — Telephone Encounter (Signed)
[place].  Will confirm pt's meds.  As discussed at last ov, she was to take Hydroxyzine in the evenings for agitation.  This should help with making her drowsy as well.  If she is taking this as rx'd and it is not helping, will check with RAS for other options/fim

## 2016-01-30 NOTE — Telephone Encounter (Signed)
Pt's son, Elta Guadeloupe , called stating pt is awake at all hours and needs something to sleep. Pt is a resident at [facility]. Call back [phone]

## 2016-01-30 NOTE — Telephone Encounter (Signed)
I have spoken with Elta Guadeloupe.  He sts. Hydroxyzine is not helping pt. enough with sleep.  Appt. with RAS given to discuss other options/fim

## 2016-02-06 ENCOUNTER — Ambulatory Visit (INDEPENDENT_AMBULATORY_CARE_PROVIDER_SITE_OTHER): Payer: Medicare Other | Admitting: Neurology

## 2016-02-06 ENCOUNTER — Encounter: Payer: Self-pay | Admitting: Neurology

## 2016-02-06 VITALS — BP 126/60 | HR 58 | Resp 18 | Ht 62.0 in | Wt 134.0 lb

## 2016-02-06 DIAGNOSIS — F0281 Dementia in other diseases classified elsewhere with behavioral disturbance: Secondary | ICD-10-CM

## 2016-02-06 DIAGNOSIS — R269 Unspecified abnormalities of gait and mobility: Secondary | ICD-10-CM | POA: Diagnosis not present

## 2016-02-06 DIAGNOSIS — N3946 Mixed incontinence: Secondary | ICD-10-CM

## 2016-02-06 DIAGNOSIS — F0280 Dementia in other diseases classified elsewhere without behavioral disturbance: Secondary | ICD-10-CM | POA: Insufficient documentation

## 2016-02-06 DIAGNOSIS — G301 Alzheimer's disease with late onset: Secondary | ICD-10-CM

## 2016-02-06 DIAGNOSIS — R451 Restlessness and agitation: Secondary | ICD-10-CM

## 2016-02-06 DIAGNOSIS — G309 Alzheimer's disease, unspecified: Secondary | ICD-10-CM

## 2016-02-06 DIAGNOSIS — F02818 Dementia in other diseases classified elsewhere, unspecified severity, with other behavioral disturbance: Secondary | ICD-10-CM

## 2016-02-06 MED ORDER — RISPERIDONE 0.25 MG PO TABS: ORAL_TABLET | ORAL | Status: AC

## 2016-02-06 NOTE — Patient Instructions (Signed)
Take risperidone, one pill at lunch and one pill at dinner  Stop hydroxyzine

## 2016-02-06 NOTE — Progress Notes (Signed)
[facility]  PATIENT: [name] [name] DOB: 07/22/1929  REFERRING DOCTOR OR PCP:  Cari Caraway SOURCE: family and records  _________________________________   HISTORICAL  CHIEF COMPLAINT:  Chief Complaint  Patient presents with  . Dementia    [name] [name] is living at [facility] nsg[facility] home.  Son sts. she is sleeping alot during the day, becomes more awake and confused in the evening.  Vistaril doesn't help./fim  . Insomnia    HISTORY OF PRESENT ILLNESS:  [name] [name] is an 82 year old woman with dementia and cortical atrophy most consistent with late onset Alzheimer's disease.Marland Kitchen   Besides memory loss, she has had problems with agitation in the evenings. She is here accompanied by her son [name] [name].  Dementia/agitation.     She has had dementia x 2 years.    Her son feels that the memory difficulties have been slowly progressive since the last visit. Additionally confusion and agitation are worse.  She has been on Aricept since early 2016 and tolerates it well.   However, there has been no definite improvement of memory or other cognitive function   We checked CT of the head and cervical spine earlier this year.    She has chronic changes but no evidence of subdural hematomas or other acute changes. She has atrophy that is most pronounced in the mesial temporal lobes and parietal lobes.    Confusion:   She gets up most nights and then stays awake.  In the evenings, she has more confusion and delusions.   She is having some agitation in the evening despite vistaril.    She is overly talkative and even talks in her sleep.  Gait:    Her gait is shuffling but mostly stable this year.  She has no falls.   She uses a Radiation protection practitioner and gets around well for the most part.   There was no evidence of normal pressure hydrocephalus on CT scan.     Bladder:   Additionally, she is having difficulty with her bladder and uses Depends undergarments.    She was not better on Vesicare.       REVIEW OF SYSTEMS: Constitutional: No fevers, chills, sweats, or change in appetite Eyes: No visual changes, double vision, eye pain Ear, nose and throat: No hearing loss, ear pain, nasal congestion, sore throat Cardiovascular: No chest pain, palpitations Respiratory: No shortness of breath at rest or with exertion.   No wheezes GastrointestinaI: No nausea, vomiting, diarrhea, abdominal pain, fecal incontinence Genitourinary: No dysuria, urinary retention or frequency.  No nocturia. Musculoskeletal: No neck pain, back pain Integumentary: No rash, pruritus, skin lesions Neurological: as above Psychiatric: No depression at this time.  No anxiety Endocrine: No palpitations, diaphoresis, change in appetite, change in weigh or increased thirst Hematologic/Lymphatic: No anemia, purpura, petechiae. Allergic/Immunologic: No itchy/runny eyes, nasal congestion, recent allergic reactions, rashes  ALLERGIES: Allergies  Allergen Reactions  . Sulfa Antibiotics Itching    HOME MEDICATIONS:  Current outpatient prescriptions:  .  aspirin EC 81 MG EC tablet, Take 1 tablet (81 mg total) by mouth daily., Disp: 30 tablet, Rfl: 1 .  bisacodyl (DULCOLAX) 5 MG EC tablet, Take 5 mg by mouth every Monday, Wednesday, and Friday., Disp: , Rfl:  .  busPIRone (BUSPAR) 10 MG tablet, Take 10 mg by mouth 2 (two) times daily., Disp: , Rfl:  .  Cholecalciferol (VITAMIN D) 2000 UNITS tablet, Take 2,000 Units by mouth daily., Disp: , Rfl:  .  diltiazem (CARDIZEM CD) 180 MG 24 hr  capsule, Take 1 capsule (180 mg total) by mouth daily., Disp: 30 capsule, Rfl: 11 .  docusate sodium (COLACE) 50 MG capsule, Take 50 mg by mouth daily., Disp: , Rfl:  .  donepezil (ARICEPT) 10 MG tablet, Take 10 mg by mouth at bedtime., Disp: , Rfl:  .  Ferrous Sulfate (IRON) 325 (65 FE) MG TABS, Take 1 tablet by mouth daily with breakfast., Disp: , Rfl:  .  hydrOXYzine (ATARAX/VISTARIL) 10 MG tablet, 1 to 3 po nightly (Patient taking  differently: Take 10 mg by mouth every evening. ), Disp: 90 tablet, Rfl: 2 .  loratadine (CLARITIN) 10 MG tablet, Take 10 mg by mouth daily as needed for allergies. , Disp: , Rfl:  .  mometasone (NASONEX) 50 MCG/ACT nasal spray, Place 2 sprays into the nose daily as needed (for allergies)., Disp: , Rfl:  .  nitroGLYCERIN (NITROSTAT) 0.4 MG SL tablet, Place 1 tablet (0.4 mg total) under the tongue every 5 (five) minutes as needed for chest pain., Disp: 30 tablet, Rfl: 0 .  ondansetron (ZOFRAN) 4 MG tablet, Take 4-8 mg by mouth every 6 (six) hours as needed for nausea., Disp: , Rfl:  .  pantoprazole (PROTONIX) 40 MG tablet, Take 40 mg by mouth daily., Disp: , Rfl:  .  traMADol (ULTRAM) 50 MG tablet, Take 50-100 mg by mouth 2 (two) times daily as needed (for pain). , Disp: , Rfl:  .  Venlafaxine HCl 225 MG TB24, Take 3 tablets by mouth daily., Disp: , Rfl:  .  vitamin B-12 (CYANOCOBALAMIN) 1000 MCG tablet, Take 1,000 mcg by mouth daily., Disp: , Rfl:  .  vitamin C (VITAMIN C) 250 MG tablet, Take 1 tablet (250 mg total) by mouth 2 (two) times daily., Disp: 60 tablet, Rfl: 0 .  [DISCONTINUED] ferrous gluconate (FERGON) 324 MG tablet, Take 1 tablet (324 mg total) by mouth 2 (two) times daily with a meal. (Patient not taking: Reported on 01/10/2015), Disp: 120 tablet, Rfl: 0  PAST MEDICAL HISTORY: Past Medical History  Diagnosis Date  . Hypertension   . Arthritis   . Hiatal hernia     GERD  . Iron deficiency anemia   . DJD (degenerative joint disease)     Hands  . Depression   . Anxiety   . Allergic rhinitis   . Vitamin B deficiency   . Vitamin D deficiency   . GERD (gastroesophageal reflux disease)   . Osteoporosis   . Hemorrhoids   . Gastritis   . Vertigo   . Dementia   . COPD (chronic obstructive pulmonary disease) ([place])   . Subdural hemorrhage ([place])   . Angiodysplasia of colon with hemorrhage 05/2013  . GI bleed   . Anemia   . Vision abnormalities     PAST SURGICAL HISTORY: Past  Surgical History  Procedure Laterality Date  . Orif radial fracture Right 09/2007  . Esophagogastroduodenoscopy N/A 06/19/2013    Procedure: ESOPHAGOGASTRODUODENOSCOPY (EGD);  Surgeon: Juanita Craver, MD;  Location: [facility] ENDOSCOPY;  Service: Endoscopy;  Laterality: N/A;  . Colonoscopy N/A 06/20/2013    Procedure: COLONOSCOPY;  Surgeon: Jerene Bears, MD;  Location: [place];  Service: Gastroenterology;  Laterality: N/A;  . Hot hemostasis N/A 06/20/2013    Procedure: HOT HEMOSTASIS (ARGON PLASMA COAGULATION/BICAP);  Surgeon: Jerene Bears, MD;  Location: [place];  Service: Gastroenterology;  Laterality: N/A;    FAMILY HISTORY: Family History  Problem Relation Age of Onset  . Diabetes Father   . Stroke Father   .  Hypertension Father   . Atrial fibrillation Mother   . Hypertension Mother   . Anemia Mother   . CAD Maternal Grandfather   . Stomach cancer Paternal Grandmother     questionable    SOCIAL HISTORY:  Social History   Social History  . Marital Status: Widowed    Spouse Name: N/A  . Number of Children: N/A  . Years of Education: N/A   Occupational History  . Not on file.   Social History Main Topics  . Smoking status: Former Smoker -- 1.00 packs/day for 15 years    Types: Cigarettes    Quit date: 09/22/1992  . Smokeless tobacco: Never Used  . Alcohol Use: No  . Drug Use: No  . Sexual Activity: Not on file   Other Topics Concern  . 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  Lives independently and able to get around without a cane or walker, still quite healthy and active. Has two sisters who are involved. One son, two grandchildren. Her son currently has a brain tumor, which is causing the patient stress. Sylvester Harder, sister, [phone]. She quit smoking and drinking about 20 yrs ago.      PHYSICAL EXAM  Filed Vitals:   02/06/16 1535  BP: 126/60  Pulse: 58  Resp: 18  Height: 5\' 2"  (1.575 m)    Weight: 134 lb (60.782 kg)    Body mass index is 24.5 kg/(m^2).   General: The patient is well-developed and well-nourished and in no acute distress  HEENT:   [place]/AT, reduced hearing, neck good ROM   Neurologic Exam  Mental status: The patient is alert and oriented to name but not date or place. Attention is reduced.  She is very distractible.   She knows left from right and can follow 2 step commands. 5+7=?; 20-3 = ?Marland Kitchen    Short-term memory is 0/3 at 3 minutes.   Speech is normal.  Cranial nerves: Extraocular movements are full.  Facial symmetry is present. There is good facial sensation to soft touch bilaterally.Facial strength is normal.  Trapezius and sternocleidomastoid strength is normal. No dysarthria is noted.  The tongue is midline, and the patient has symmetric elevation of the soft palate.    Reduced hearing bilaterally  Motor:  Muscle bulk is normal.   Tone is normal. Strength is  5 / 5 in all 4 extremities.   Sensory: Sensory testing is intact to touch in all 4 extremities.  Coordination: Cerebellar testing reveals good finger-nose-finger bilaterally.  Gait and station: Station is normal.   Gait is wide-based with a mildly reduced stride and she take 7 steps to turn 180. She cannot tandem walk.  Reflexes: Deep tendon reflexes are symmetric and normal bilaterally.      DIAGNOSTIC DATA (LABS, IMAGING, TESTING) - I reviewed patient records, labs, notes, testing and imaging myself where available.  Lab Results  Component Value Date   WBC 24.8* 01/10/2015   HGB 12.3 01/10/2015   HCT 38.7 01/10/2015   MCV 90.8 01/10/2015   PLT 350 01/10/2015      Component Value Date/Time   NA 139 01/10/2015 0840   NA 142 12/28/2014 1513   K 3.8 01/10/2015 0840   CL 102 01/10/2015 0840   CO2 25 01/10/2015 0840   GLUCOSE 119* 01/10/2015 0840   GLUCOSE 94 12/28/2014 1513   BUN 14 01/10/2015 0840   BUN 13 12/28/2014 1513   CREATININE  1.00 01/10/2015 0840   CALCIUM 8.7  01/10/2015 0840   PROT 6.6 12/28/2014 1513   PROT 7.0 06/17/2013 1904   ALBUMIN 4.3 12/28/2014 1513   ALBUMIN 3.2* 06/17/2013 1904   AST 11 12/28/2014 1513   ALT 9 12/28/2014 1513   ALKPHOS 106 12/28/2014 1513   BILITOT 0.2 12/28/2014 1513   BILITOT 0.1* 06/17/2013 1904   GFRNONAA 51* 01/10/2015 0840   GFRAA 59* 01/10/2015 0840    Lab Results  Component Value Date   [id number] 02/27/2015   Lab Results  Component Value Date   TSH 2.170 02/27/2015       ASSESSMENT AND PLAN  Late onset Alzheimer's disease with behavioral disturbance  Agitation  Gait disturbance  Mixed incontinence    1.  Continue Aricept.   2.  Risperdal at lunchtime and dinner time for agitation.  Stop hydroxyzine 3.  She should try to remain active and continue to engage in activities with others. 4.  Return in 6 months or sooner if she has new or worsening neurologic symptoms.   Marylou Wages A. Felecia Shelling, MD, PhD [id number], [id number] PM Certified in Neurology, Clinical Neurophysiology, Sleep Medicine, Pain Medicine and Neuroimaging  [facility] [address] [place], [place] [address] [phone]

## 2016-03-12 ENCOUNTER — Ambulatory Visit: Payer: Medicare Other | Admitting: Neurology

## 2016-05-24 ENCOUNTER — Emergency Department (HOSPITAL_COMMUNITY): Payer: Medicare Other

## 2016-05-24 ENCOUNTER — Encounter (HOSPITAL_COMMUNITY): Payer: Self-pay | Admitting: Emergency Medicine

## 2016-05-24 ENCOUNTER — Emergency Department (HOSPITAL_COMMUNITY)
Admission: EM | Admit: 2016-05-24 | Discharge: 2016-05-24 | Disposition: A | Discharge status: Home / Self Care | Payer: Medicare Other | Attending: Emergency Medicine | Admitting: Emergency Medicine

## 2016-05-24 DIAGNOSIS — Z7951 Long term (current) use of inhaled steroids: Secondary | ICD-10-CM | POA: Insufficient documentation

## 2016-05-24 DIAGNOSIS — G309 Alzheimer's disease, unspecified: Secondary | ICD-10-CM | POA: Insufficient documentation

## 2016-05-24 DIAGNOSIS — I10 Essential (primary) hypertension: Secondary | ICD-10-CM | POA: Insufficient documentation

## 2016-05-24 DIAGNOSIS — Z79899 Other long term (current) drug therapy: Secondary | ICD-10-CM | POA: Diagnosis not present

## 2016-05-24 DIAGNOSIS — W19XXXA Unspecified fall, initial encounter: Secondary | ICD-10-CM

## 2016-05-24 DIAGNOSIS — Y999 Unspecified external cause status: Secondary | ICD-10-CM | POA: Insufficient documentation

## 2016-05-24 DIAGNOSIS — J449 Chronic obstructive pulmonary disease, unspecified: Secondary | ICD-10-CM | POA: Insufficient documentation

## 2016-05-24 DIAGNOSIS — S0990XA Unspecified injury of head, initial encounter: Secondary | ICD-10-CM | POA: Diagnosis present

## 2016-05-24 DIAGNOSIS — Y9389 Activity, other specified: Secondary | ICD-10-CM | POA: Insufficient documentation

## 2016-05-24 DIAGNOSIS — Z87891 Personal history of nicotine dependence: Secondary | ICD-10-CM | POA: Diagnosis not present

## 2016-05-24 DIAGNOSIS — Z7982 Long term (current) use of aspirin: Secondary | ICD-10-CM | POA: Insufficient documentation

## 2016-05-24 DIAGNOSIS — Y92129 Unspecified place in nursing home as the place of occurrence of the external cause: Secondary | ICD-10-CM | POA: Diagnosis not present

## 2016-05-24 DIAGNOSIS — W07XXXA Fall from chair, initial encounter: Secondary | ICD-10-CM | POA: Insufficient documentation

## 2016-05-24 DIAGNOSIS — I2510 Atherosclerotic heart disease of native coronary artery without angina pectoris: Secondary | ICD-10-CM | POA: Diagnosis not present

## 2016-05-24 NOTE — ED Notes (Signed)
Bed: WA08 Expected date:  Expected time:  Means of arrival:  Comments: Fall 

## 2016-05-24 NOTE — ED Triage Notes (Signed)
Per EMS pt from [facility] with unwitnessed fall post eating lunch. Pt initial complaint of neck and back pain. With triage pt denies pain. Hx of dementia alert per normal.

## 2016-05-24 NOTE — ED Provider Notes (Signed)
[place] DEPT Provider Note   CSN: [id number] Arrival date & time: 05/24/16  1324     History   Chief Complaint Chief Complaint  Patient presents with  . Fall    HPI [name] [name] is a 82 y.o. female.  82 year old female with past medical history including dementia, hypertension, COPD who presents for evaluation after a fall. History obtained from son. He reports that the patient was at lunch at her nursing facility and fell out of a chair. Nursing staff reported that she bumped her head during the fall. She reportedly initially complained of neck and back pain but currently she denies any pain. She is at her neurologic baseline according to son. No anticoagulant use.  LEVEL 5 CAVEAT DUE TO DEMENTIA   The history is provided by a relative.  Fall     Past Medical History:  Diagnosis Date  . Allergic rhinitis   . Anemia   . Angiodysplasia of colon with hemorrhage 05/2013  . Anxiety   . Arthritis   . COPD (chronic obstructive pulmonary disease) ([place])   . Dementia   . Depression   . DJD (degenerative joint disease)    Hands  . Gastritis   . GERD (gastroesophageal reflux disease)   . GI bleed   . Hemorrhoids   . Hiatal hernia    GERD  . Hypertension   . Iron deficiency anemia   . Osteoporosis   . Subdural hemorrhage ([place])   . Vertigo   . Vision abnormalities   . Vitamin B deficiency   . Vitamin D deficiency     Patient Active Problem List   Diagnosis Date Noted  . Alzheimer's disease 02/06/2016  . Hygroma 12/28/2014  . Gait disturbance 12/28/2014  . Agitation 12/28/2014  . Urinary incontinence 12/28/2014  . Diastolic dysfunction [id number]  . CAD (coronary artery disease) 09/02/2013  . IDA (iron deficiency anemia) 08/01/2013  . SOB (shortness of breath) 06/27/2013  . Angiodysplasia of colon 06/20/2013  . Diverticulosis of colon with hemorrhage 06/20/2013  . Unspecified gastritis and gastroduodenitis without mention of hemorrhage 06/19/2013  . COPD  GOLD II  06/18/2013  . Chronic fatigue 06/18/2013  . DOE (dyspnea on exertion) 06/18/2013  . Anemia 06/17/2013  . Hypocalcemia 06/17/2013  . Arthritis   . Chest pain 12/03/2012  . Dementia 10/22/2012  . Multiple falls 10/19/2012  . Community acquired pneumonia 10/19/2012  . Displaced fracture of fifth metatarsal bone 10/19/2012  . Influenza A 09/18/2011  . Hypertension   . Depression     Past Surgical History:  Procedure Laterality Date  . COLONOSCOPY N/A 06/20/2013   Procedure: COLONOSCOPY;  Surgeon: Jerene Bears, MD;  Location: [place];  Service: Gastroenterology;  Laterality: N/A;  . ESOPHAGOGASTRODUODENOSCOPY N/A 06/19/2013   Procedure: ESOPHAGOGASTRODUODENOSCOPY (EGD);  Surgeon: Juanita Craver, MD;  Location: [facility] ENDOSCOPY;  Service: Endoscopy;  Laterality: N/A;  . HOT HEMOSTASIS N/A 06/20/2013   Procedure: HOT HEMOSTASIS (ARGON PLASMA COAGULATION/BICAP);  Surgeon: Jerene Bears, MD;  Location: [place];  Service: Gastroenterology;  Laterality: N/A;  . ORIF RADIAL FRACTURE Right 09/2007    OB History    No data available       Home Medications    Prior to Admission medications   Medication Sig Start Date End Date Taking? Authorizing Provider  aspirin EC 81 MG EC tablet Take 1 tablet (81 mg total) by mouth daily. 12/04/12   Bonnielee Haff, MD  bisacodyl (DULCOLAX) 5 MG EC tablet Take 5 mg by mouth every  Monday, Wednesday, and Friday.    Historical Provider, MD  busPIRone (BUSPAR) 10 MG tablet Take 10 mg by mouth 2 (two) times daily.    Historical Provider, MD  Cholecalciferol (VITAMIN D) 2000 UNITS tablet Take 2,000 Units by mouth daily.    Historical Provider, MD  diltiazem (CARDIZEM CD) 180 MG 24 hr capsule Take 1 capsule (180 mg total) by mouth daily. 09/02/13   Sueanne Margarita, MD  docusate sodium (COLACE) 50 MG capsule Take 50 mg by mouth daily.    Historical Provider, MD  donepezil (ARICEPT) 10 MG tablet Take 10 mg by mouth at bedtime.    Historical Provider, MD    loratadine (CLARITIN) 10 MG tablet Take 10 mg by mouth daily as needed for allergies.     Historical Provider, MD  mometasone (NASONEX) 50 MCG/ACT nasal spray Place 2 sprays into the nose daily as needed (for allergies).    Historical Provider, MD  nitroGLYCERIN (NITROSTAT) 0.4 MG SL tablet Place 1 tablet (0.4 mg total) under the tongue every 5 (five) minutes as needed for chest pain. 12/04/12   Bonnielee Haff, MD  ondansetron (ZOFRAN) 4 MG tablet Take 4-8 mg by mouth every 6 (six) hours as needed for nausea.    Historical Provider, MD  pantoprazole (PROTONIX) 40 MG tablet Take 40 mg by mouth daily.    Historical Provider, MD  risperiDONE (RISPERDAL) 0.25 MG tablet Take one po at lunch and one po at dinner 02/06/16   Britt Bottom, MD  traMADol (ULTRAM) 50 MG tablet Take 50-100 mg by mouth 2 (two) times daily as needed (for pain).     Historical Provider, MD  Venlafaxine HCl 225 MG TB24 Take 3 tablets by mouth daily.    Historical Provider, MD  vitamin B-12 (CYANOCOBALAMIN) 1000 MCG tablet Take 1,000 mcg by mouth daily.    Historical Provider, MD  vitamin C (VITAMIN C) 250 MG tablet Take 1 tablet (250 mg total) by mouth 2 (two) times daily. 06/21/13   Allie Bossier, MD    Family History Family History  Problem Relation Age of Onset  . Diabetes Father   . Stroke Father   . Hypertension Father   . Atrial fibrillation Mother   . Hypertension Mother   . Anemia Mother   . CAD Maternal Grandfather   . Stomach cancer Paternal Grandmother     questionable    Social History Social History  Substance Use Topics  . Smoking status: Former Smoker    Packs/day: 1.00    Years: 15.00    Types: Cigarettes    Quit date: 09/22/1992  . Smokeless tobacco: Never Used  . Alcohol use No     Allergies   Sulfa antibiotics   Review of Systems Review of Systems  Unable to perform ROS: Dementia     Physical Exam Updated Vital Signs BP 162/89 (BP Location: Left Arm)   Pulse 76   Temp 99.1 F  (37.3 C) (Oral)   Resp 18   SpO2 98%   Physical Exam  Constitutional: She appears well-developed and well-nourished. No distress.  Elderly woman sitting up in bed, Awake, alert  HENT:  Head: Normocephalic and atraumatic.  Mouth/Throat: Oropharynx is clear and moist.  Eyes: Conjunctivae are normal. Pupils are equal, round, and reactive to light.  Neck: Neck supple.  Cardiovascular: Normal rate, regular rhythm and normal heart sounds.   No murmur heard. Pulmonary/Chest: Effort normal and breath sounds normal. No respiratory distress. She exhibits no tenderness.  Abdominal: Soft. Bowel sounds are normal. She exhibits no distension. There is no tenderness.  Musculoskeletal: She exhibits no edema or tenderness.  Neurological: She is alert. She has normal reflexes. No cranial nerve deficit. She exhibits normal muscle tone.  Oriented to person 5/5 strength and normal sensation x all 4 extremities  Skin: Skin is warm and dry.  Psychiatric:  Calm, cooperative  Nursing note and vitals reviewed.    ED Treatments / Results  Labs (all labs ordered are listed, but only abnormal results are displayed) Labs Reviewed - No data to display  EKG  EKG Interpretation None       Radiology Ct Head Wo Contrast  Result Date: 05/24/2016 CLINICAL DATA:  Unwitnessed fall after eating lunch, neck and back pain, dementia, hypertension, COPD, former smoker EXAM: CT HEAD WITHOUT CONTRAST CT CERVICAL SPINE WITHOUT CONTRAST TECHNIQUE: Multidetector CT imaging of the head and cervical spine was performed following the standard protocol without intravenous contrast. Multiplanar CT image reconstructions of the cervical spine were also generated. COMPARISON:  01/10/2015 FINDINGS: CT HEAD FINDINGS Generalized atrophy. Slight prominent ventricular system unchanged, likely related to atrophy. No midline shift or mass effect. Small vessel chronic ischemic changes of deep cerebral white matter. No intracranial  hemorrhage, mass lesion or evidence acute infarction. No extra-axial fluid collections. Sinuses clear and bones unremarkable. Atherosclerotic calcifications at the carotid siphons. CT CERVICAL SPINE FINDINGS Osseous demineralization. Prevertebral soft tissues normal thickness. Multilevel disc space narrowing and endplate spur formation. Vertebral body heights maintained. Multilevel facet degenerative changes. No fracture, subluxation or bone destruction. Atherosclerotic calcifications at the carotid bifurcations IMPRESSION: Atrophy with small vessel chronic ischemic changes of deep cerebral white matter. No acute intracranial abnormalities. Degenerative disc and facet disease changes of the cervical spine. No acute cervical spine abnormalities. Electronically Signed   By: Lavonia Dana M.D.   On: 05/24/2016 15:20   Ct Cervical Spine Wo Contrast  Result Date: 05/24/2016 CLINICAL DATA:  Unwitnessed fall after eating lunch, neck and back pain, dementia, hypertension, COPD, former smoker EXAM: CT HEAD WITHOUT CONTRAST CT CERVICAL SPINE WITHOUT CONTRAST TECHNIQUE: Multidetector CT imaging of the head and cervical spine was performed following the standard protocol without intravenous contrast. Multiplanar CT image reconstructions of the cervical spine were also generated. COMPARISON:  01/10/2015 FINDINGS: CT HEAD FINDINGS Generalized atrophy. Slight prominent ventricular system unchanged, likely related to atrophy. No midline shift or mass effect. Small vessel chronic ischemic changes of deep cerebral white matter. No intracranial hemorrhage, mass lesion or evidence acute infarction. No extra-axial fluid collections. Sinuses clear and bones unremarkable. Atherosclerotic calcifications at the carotid siphons. CT CERVICAL SPINE FINDINGS Osseous demineralization. Prevertebral soft tissues normal thickness. Multilevel disc space narrowing and endplate spur formation. Vertebral body heights maintained. Multilevel facet  degenerative changes. No fracture, subluxation or bone destruction. Atherosclerotic calcifications at the carotid bifurcations IMPRESSION: Atrophy with small vessel chronic ischemic changes of deep cerebral white matter. No acute intracranial abnormalities. Degenerative disc and facet disease changes of the cervical spine. No acute cervical spine abnormalities. Electronically Signed   By: Lavonia Dana M.D.   On: 05/24/2016 15:20    Procedures Procedures (including critical care time)  Medications Ordered in ED Medications - No data to display   Initial Impression / Assessment and Plan / ED Course  I have reviewed the triage vital signs and the nursing notes.  Pertinent imaging results that were available during my care of the patient were reviewed by me and considered in my medical decision making (see  chart for details).  Clinical Course   Pt with Witnessed fall at nursing facility, reportedly bumped her head during fall. She was awake and comfortable on exam. Son states that she is at her neurologic baseline. No external signs of trauma on exam. Obtained CT of head and C-spine which were negative for acute injury. Patient remained well appearing after observation in the ED. Discussed return precautions including any vomiting, lethargy, or abnormal behavior. Son voiced understanding and patient was discharged in satisfactory condition.  Final Clinical Impressions(s) / ED Diagnoses   Final diagnoses:  Fall, initial encounter  Minor head injury, initial encounter    New Prescriptions New Prescriptions   No medications on file     Sharlett Iles, MD 05/24/16 [phone]

## 2016-05-24 NOTE — ED Notes (Signed)
Pt in CT.

## 2016-05-24 NOTE — Discharge Instructions (Signed)
Please return if any new symptoms including vomiting, confusion, altered behavior, or lethargy.

## 2016-05-24 NOTE — ED Notes (Signed)
Son at bedside.

## 2016-06-01 ENCOUNTER — Emergency Department (HOSPITAL_COMMUNITY)
Admission: EM | Admit: 2016-06-01 | Discharge: 2016-06-01 | Disposition: A | Discharge status: Home / Self Care | Payer: Medicare Other | Attending: Emergency Medicine | Admitting: Emergency Medicine

## 2016-06-01 ENCOUNTER — Emergency Department (HOSPITAL_COMMUNITY): Payer: Medicare Other

## 2016-06-01 ENCOUNTER — Encounter (HOSPITAL_COMMUNITY): Payer: Self-pay

## 2016-06-01 DIAGNOSIS — W19XXXA Unspecified fall, initial encounter: Secondary | ICD-10-CM

## 2016-06-01 DIAGNOSIS — I2510 Atherosclerotic heart disease of native coronary artery without angina pectoris: Secondary | ICD-10-CM | POA: Insufficient documentation

## 2016-06-01 DIAGNOSIS — Y9289 Other specified places as the place of occurrence of the external cause: Secondary | ICD-10-CM | POA: Diagnosis not present

## 2016-06-01 DIAGNOSIS — J449 Chronic obstructive pulmonary disease, unspecified: Secondary | ICD-10-CM | POA: Diagnosis not present

## 2016-06-01 DIAGNOSIS — S4991XA Unspecified injury of right shoulder and upper arm, initial encounter: Secondary | ICD-10-CM | POA: Diagnosis present

## 2016-06-01 DIAGNOSIS — S42001A Fracture of unspecified part of right clavicle, initial encounter for closed fracture: Secondary | ICD-10-CM

## 2016-06-01 DIAGNOSIS — Z7982 Long term (current) use of aspirin: Secondary | ICD-10-CM | POA: Insufficient documentation

## 2016-06-01 DIAGNOSIS — Z87891 Personal history of nicotine dependence: Secondary | ICD-10-CM | POA: Diagnosis not present

## 2016-06-01 DIAGNOSIS — W07XXXA Fall from chair, initial encounter: Secondary | ICD-10-CM | POA: Insufficient documentation

## 2016-06-01 DIAGNOSIS — Z79899 Other long term (current) drug therapy: Secondary | ICD-10-CM | POA: Insufficient documentation

## 2016-06-01 DIAGNOSIS — Y9389 Activity, other specified: Secondary | ICD-10-CM | POA: Insufficient documentation

## 2016-06-01 DIAGNOSIS — Y999 Unspecified external cause status: Secondary | ICD-10-CM | POA: Diagnosis not present

## 2016-06-01 DIAGNOSIS — G309 Alzheimer's disease, unspecified: Secondary | ICD-10-CM | POA: Insufficient documentation

## 2016-06-01 DIAGNOSIS — I10 Essential (primary) hypertension: Secondary | ICD-10-CM | POA: Diagnosis not present

## 2016-06-01 DIAGNOSIS — S42031A Displaced fracture of lateral end of right clavicle, initial encounter for closed fracture: Secondary | ICD-10-CM | POA: Insufficient documentation

## 2016-06-01 MED ORDER — ONDANSETRON HCL 4 MG/2ML IJ SOLN: 4.0000 mg | Freq: Once | INTRAMUSCULAR | Status: DC

## 2016-06-01 MED ORDER — HYDROCODONE-ACETAMINOPHEN 5-325 MG PO TABS: 1.0000 | ORAL_TABLET | Freq: Four times a day (QID) | ORAL | 0 refills | Status: AC | PRN

## 2016-06-01 MED ORDER — HYDROCODONE-ACETAMINOPHEN 5-325 MG PO TABS
1.0000 | ORAL_TABLET | Freq: Once | ORAL | Status: AC
  Administered 2016-06-01: 1 via ORAL
  Filled 2016-06-01: qty 1

## 2016-06-01 NOTE — Discharge Instructions (Signed)
Xrays today show a clavicle fracture.  We have placed you in a sling that you need to wear at all times.  You may take Norco every 6 hours as needed for pain.  Please follow up with orthopedics in 1 week for reassessment.

## 2016-06-01 NOTE — ED Triage Notes (Signed)
Pt c/o R clavicle injury after a fall x 2 days ago.  Pain score 8/10.  Pt's son reports that she fell out of a chair.  Sts she had a xray which resulted a fractured R clavicle.  Sts he spoke to [facility] and they directed her to the ED.

## 2016-06-01 NOTE — ED Notes (Signed)
Patient transported to X-ray 

## 2016-06-01 NOTE — ED Provider Notes (Signed)
[facility] DEPT Provider Note   CSN: [id number] Arrival date & time: 06/01/16  1501     History   Chief Complaint Chief Complaint  Patient presents with  . Fall  . Clavicle Injury    HPI [name] [name] is a 82 y.o. female.  HPI [name] [name] is a 82 y.o. female with PMH significant for Dementia, COPD, DJD, arthritis, HTN, OP who presents with fall 2 days ago.  Patient lives at [facility] where she was going to sit down in a chair and fell, landing on her right arm.  No head injury/LOC.  Facility gave her Tylenol and had xrays performed Saturday.  Son was called today and told she had right clavicle fracture.  He called her PCP, [facility] as well as his orthopedist, [facility], who recommended ED evaluation.  She has been in her usual state of health without complaints.  She is at her normal neurological baseline.  Past Medical History:  Diagnosis Date  . Allergic rhinitis   . Anemia   . Angiodysplasia of colon with hemorrhage 05/2013  . Anxiety   . Arthritis   . COPD (chronic obstructive pulmonary disease) ([place])   . Dementia   . Depression   . DJD (degenerative joint disease)    Hands  . Gastritis   . GERD (gastroesophageal reflux disease)   . GI bleed   . Hemorrhoids   . Hiatal hernia    GERD  . Hypertension   . Iron deficiency anemia   . Osteoporosis   . Subdural hemorrhage ([place])   . Vertigo   . Vision abnormalities   . Vitamin B deficiency   . Vitamin D deficiency     Patient Active Problem List   Diagnosis Date Noted  . Alzheimer's disease 02/06/2016  . Hygroma 12/28/2014  . Gait disturbance 12/28/2014  . Agitation 12/28/2014  . Urinary incontinence 12/28/2014  . Diastolic dysfunction 123456  . CAD (coronary artery disease) 09/02/2013  . IDA (iron deficiency anemia) 08/01/2013  . SOB (shortness of breath) 06/27/2013  . Angiodysplasia of colon 06/20/2013  . Diverticulosis of colon with hemorrhage 06/20/2013  .  Unspecified gastritis and gastroduodenitis without mention of hemorrhage 06/19/2013  . COPD GOLD II  06/18/2013  . Chronic fatigue 06/18/2013  . DOE (dyspnea on exertion) 06/18/2013  . Anemia 06/17/2013  . Hypocalcemia 06/17/2013  . Arthritis   . Chest pain 12/03/2012  . Dementia 10/22/2012  . Multiple falls 10/19/2012  . Community acquired pneumonia 10/19/2012  . Displaced fracture of fifth metatarsal bone 10/19/2012  . Influenza A 09/18/2011  . Hypertension   . Depression     Past Surgical History:  Procedure Laterality Date  . COLONOSCOPY N/A 06/20/2013   Procedure: COLONOSCOPY;  Surgeon: Jerene Bears, MD;  Location: [facility];  Service: Gastroenterology;  Laterality: N/A;  . ESOPHAGOGASTRODUODENOSCOPY N/A 06/19/2013   Procedure: ESOPHAGOGASTRODUODENOSCOPY (EGD);  Surgeon: Juanita Craver, MD;  Location: [facility] ENDOSCOPY;  Service: Endoscopy;  Laterality: N/A;  . HOT HEMOSTASIS N/A 06/20/2013   Procedure: HOT HEMOSTASIS (ARGON PLASMA COAGULATION/BICAP);  Surgeon: Jerene Bears, MD;  Location: [facility];  Service: Gastroenterology;  Laterality: N/A;  . ORIF RADIAL FRACTURE Right 09/2007    OB History    No data available       Home Medications    Prior to Admission medications   Medication Sig Start Date End Date Taking? Authorizing Provider  acetaminophen (TYLENOL) 500 MG tablet Take 500 mg by mouth every 4 (four) hours as  needed for mild pain, fever or headache.   Yes Historical Provider, MD  alum & mag hydroxide-simeth (MAALOX/MYLANTA) 200-200-20 MG/5ML suspension Take 30 mLs by mouth every 6 (six) hours as needed for indigestion or heartburn.   Yes Historical Provider, MD  aspirin EC 81 MG EC tablet Take 1 tablet (81 mg total) by mouth daily. 12/04/12  Yes Bonnielee Haff, MD  bisacodyl (DULCOLAX) 5 MG EC tablet Take 5 mg by mouth every Monday, Wednesday, and Friday.   Yes Historical Provider, MD  Cholecalciferol (VITAMIN D) 2000 UNITS tablet Take 2,000 Units by mouth daily.    Yes Historical Provider, MD  diltiazem (CARDIZEM CD) 180 MG 24 hr capsule Take 1 capsule (180 mg total) by mouth daily. 09/02/13  Yes Sueanne Margarita, MD  donepezil (ARICEPT) 10 MG tablet Take 10 mg by mouth at bedtime.   Yes Historical Provider, MD  Ferrous Gluconate 325 (36 Fe) MG TABS Take 325 mg by mouth daily.   Yes Historical Provider, MD  hydrOXYzine (ATARAX/VISTARIL) 25 MG tablet Take 25 mg by mouth 3 (three) times daily as needed for anxiety.   Yes Historical Provider, MD  loratadine (CLARITIN) 10 MG tablet Take 10 mg by mouth daily.    Yes Historical Provider, MD  magnesium hydroxide (MILK OF MAGNESIA) 400 MG/5ML suspension Take 30 mLs by mouth at bedtime as needed for mild constipation.   Yes Historical Provider, MD  neomycin-bacitracin-polymyxin (NEOSPORIN) OINT Apply 1 application topically daily as needed for wound care.   Yes Historical Provider, MD  nitroGLYCERIN (NITROSTAT) 0.4 MG SL tablet Place 1 tablet (0.4 mg total) under the tongue every 5 (five) minutes as needed for chest pain. 12/04/12  Yes Bonnielee Haff, MD  pantoprazole (PROTONIX) 40 MG tablet Take 40 mg by mouth 2 (two) times daily.    Yes Historical Provider, MD  senna-docusate (SENOKOT-S) 8.6-50 MG tablet Take 1 tablet by mouth daily.   Yes Historical Provider, MD  solifenacin (VESICARE) 5 MG tablet Take 5 mg by mouth daily.   Yes Historical Provider, MD  triamcinolone (KENALOG) 0.1 % paste Use as directed 1 application in the mouth or throat 2 (two) times daily. For 7 days to mouth sore with q-tip   Yes Historical Provider, MD  venlafaxine XR (EFFEXOR-XR) 75 MG 24 hr capsule Take 225 mg by mouth daily with breakfast.   Yes Historical Provider, MD  vitamin B-12 (CYANOCOBALAMIN) 1000 MCG tablet Take 1,000 mcg by mouth daily.   Yes Historical Provider, MD  busPIRone (BUSPAR) 10 MG tablet Take 10 mg by mouth 2 (two) times daily.    Historical Provider, MD  HYDROcodone-acetaminophen (NORCO/VICODIN) 5-325 MG tablet Take 1  tablet by mouth every 6 (six) hours as needed. 06/01/16   Gloriann Loan, PA-C  risperiDONE (RISPERDAL) 0.25 MG tablet Take one po at lunch and one po at dinner Patient not taking: Reported on 06/01/2016 02/06/16   Britt Bottom, MD  vitamin C (VITAMIN C) 250 MG tablet Take 1 tablet (250 mg total) by mouth 2 (two) times daily. Patient not taking: Reported on 06/01/2016 06/21/13   Allie Bossier, MD    Family History Family History  Problem Relation Age of Onset  . Diabetes Father   . Stroke Father   . Hypertension Father   . Atrial fibrillation Mother   . Hypertension Mother   . Anemia Mother   . CAD Maternal Grandfather   . Stomach cancer Paternal Grandmother     questionable    Social  History Social History  Substance Use Topics  . Smoking status: Former Smoker    Packs/day: 1.00    Years: 15.00    Types: Cigarettes    Quit date: 09/22/1992  . Smokeless tobacco: Never Used  . Alcohol use No     Allergies   Sulfa antibiotics   Review of Systems Review of Systems All other systems negative unless otherwise stated in HPI   Physical Exam Updated Vital Signs BP 172/78 (BP Location: Left Arm)   Pulse 64   Temp 98.6 F (37 C) (Oral)   Resp 14   SpO2 93%   Physical Exam  Constitutional: She appears well-developed and well-nourished.  Non-toxic appearance. She does not have a sickly appearance. She does not appear ill.  HENT:  Head: Normocephalic and atraumatic.  Mouth/Throat: Oropharynx is clear and moist.  Eyes: Conjunctivae are normal. Pupils are equal, round, and reactive to light.  Neck: Normal range of motion. Neck supple.  Cardiovascular: Normal rate and regular rhythm.   Pulses:      Radial pulses are 2+ on the right side, and 2+ on the left side.  Pulmonary/Chest: Effort normal and breath sounds normal. No accessory muscle usage or stridor. No respiratory distress. She has no wheezes. She has no rhonchi. She has no rales.  Abdominal: Soft. Bowel sounds are  normal. She exhibits no distension. There is no tenderness.  Musculoskeletal: Normal range of motion. She exhibits tenderness.  Right clavicle with swelling and bruising.  Exquisitely TTP.  No tenting. Moves all extremities.  Strength and sensation intact.   Lymphadenopathy:    She has no cervical adenopathy.  Neurological: She is alert.  Speech clear without dysarthria.  Skin: Skin is warm and dry. Ecchymosis noted.  No other ecchymosis or signs of trauma.   Psychiatric: She has a normal mood and affect. Her behavior is normal.     ED Treatments / Results  Labs (all labs ordered are listed, but only abnormal results are displayed) Labs Reviewed - No data to display  EKG  EKG Interpretation None       Radiology Dg Chest 2 View  Result Date: 06/01/2016 CLINICAL DATA:  Fall, clavicle pain EXAM: CHEST  2 VIEW COMPARISON:  12/12/2014 FINDINGS: Cardiomediastinal silhouette is stable. Hiatal hernia again noted. No infiltrate or pulmonary edema. There is minimal displaced fracture in distal right clavicle. IMPRESSION: No infiltrate or pulmonary edema. Minimal displaced fracture in distal right clavicle. Electronically Signed   By: Lahoma Crocker M.D.   On: 06/01/2016 16:35   Dg Clavicle Right  Result Date: 06/01/2016 CLINICAL DATA:  Fall. EXAM: RIGHT CLAVICLE - 2+ VIEWS COMPARISON:  None FINDINGS: The bones appear diffusely osteopenic. There is an acute fracture involving the distal aspect of the right clavicle. The fracture fragments are in near anatomic alignment. There is moderate degenerative changes involving the glenohumeral and acromioclavicular joints. IMPRESSION: 1. Right distal clavicle fracture. Electronically Signed   By: Kerby Moors M.D.   On: 06/01/2016 16:36    Procedures Procedures (including critical care time)  Medications Ordered in ED Medications  HYDROcodone-acetaminophen (NORCO/VICODIN) 5-325 MG per tablet 1 tablet (1 tablet Oral Given 06/01/16 1547)      Initial Impression / Assessment and Plan / ED Course  I have reviewed the triage vital signs and the nursing notes.  Pertinent labs & imaging results that were available during my care of the patient were reviewed by me and considered in my medical decision making (see chart for details).  Patient presents with mechanical fall 2 days ago. Xrays performed and son was informed she had a right clavicular fracture.  Ecchymosis and swelling to right clavicle.  No tenting.  Good perfusion of right upper extremity.  Will obtain CXR and dedicated clavicle films and give pain control.   Clinical Course  Value Comment By Time  DG Clavicle Right (Reviewed) Daleen Bo, MD 09/10 1621   Right distal clavicle fracture.  Placed in sling.  Home with Norco.  Follow up orthopedics.  Discussed with [facility], patient on memory unit, CNAs available.  Patient normally ambulates without walker.  Discussed fall precautions.  Gloriann Loan, PA-C 09/10 1639   Evaluation does not show pathology requiring ongoing emergent intervention or admission. Pt is hemodynamically stable and mentating appropriately. Discussed findings/results and plan with patient/guardian, who agrees with plan. All questions answered. Return precautions discussed and outpatient follow up given.   Case has been discussed with and seen by Dr. Garfield Cornea who agrees with the above plan for discharge.    Final Clinical Impressions(s) / ED Diagnoses   Final diagnoses:  Clavicle fracture, right, closed, initial encounter  Fall, initial encounter    New Prescriptions New Prescriptions   HYDROCODONE-ACETAMINOPHEN (NORCO/VICODIN) 5-325 MG TABLET    Take 1 tablet by mouth every 6 (six) hours as needed.     Gloriann Loan, PA-C 06/01/16 [place], MD 06/02/16 [phone]

## 2016-06-01 NOTE — ED Provider Notes (Signed)
  Face-to-face evaluation   History: Patient fell while attempting to sit down. This is apparently not unusual for her. She uses a rolling walker to ambulate. Her son is with her and states that she has "dementia".  Physical exam: Elderly patient alert and cooperative. Tender right clavicle distally, with overlying ecchymosis. Neurovascularly intact distally in the right hand. No deformity or tenderness. The right elbow or right wrist. I am able to range both legs without significant pain.  Medical screening examination/treatment/procedure(s) were conducted as a shared visit with non-physician practitioner(s) and myself.  I personally evaluated the patient during the encounter   Daleen Bo, MD 06/02/16 [phone]

## 2016-06-09 ENCOUNTER — Emergency Department (HOSPITAL_COMMUNITY)
Admission: EM | Admit: 2016-06-09 | Discharge: 2016-06-09 | Disposition: A | Discharge status: Home / Self Care | Payer: Medicare Other | Attending: Emergency Medicine | Admitting: Emergency Medicine

## 2016-06-09 ENCOUNTER — Encounter (HOSPITAL_COMMUNITY): Payer: Self-pay

## 2016-06-09 ENCOUNTER — Emergency Department (HOSPITAL_COMMUNITY): Payer: Medicare Other

## 2016-06-09 DIAGNOSIS — W06XXXA Fall from bed, initial encounter: Secondary | ICD-10-CM | POA: Insufficient documentation

## 2016-06-09 DIAGNOSIS — G309 Alzheimer's disease, unspecified: Secondary | ICD-10-CM | POA: Insufficient documentation

## 2016-06-09 DIAGNOSIS — Y92129 Unspecified place in nursing home as the place of occurrence of the external cause: Secondary | ICD-10-CM | POA: Diagnosis not present

## 2016-06-09 DIAGNOSIS — Z79899 Other long term (current) drug therapy: Secondary | ICD-10-CM | POA: Insufficient documentation

## 2016-06-09 DIAGNOSIS — S0093XA Contusion of unspecified part of head, initial encounter: Secondary | ICD-10-CM

## 2016-06-09 DIAGNOSIS — Z7982 Long term (current) use of aspirin: Secondary | ICD-10-CM | POA: Diagnosis not present

## 2016-06-09 DIAGNOSIS — J449 Chronic obstructive pulmonary disease, unspecified: Secondary | ICD-10-CM | POA: Diagnosis not present

## 2016-06-09 DIAGNOSIS — R000 Tachycardia, unspecified: Secondary | ICD-10-CM | POA: Insufficient documentation

## 2016-06-09 DIAGNOSIS — Y939 Activity, unspecified: Secondary | ICD-10-CM | POA: Insufficient documentation

## 2016-06-09 DIAGNOSIS — Y999 Unspecified external cause status: Secondary | ICD-10-CM | POA: Diagnosis not present

## 2016-06-09 DIAGNOSIS — I10 Essential (primary) hypertension: Secondary | ICD-10-CM | POA: Insufficient documentation

## 2016-06-09 DIAGNOSIS — Z87891 Personal history of nicotine dependence: Secondary | ICD-10-CM | POA: Diagnosis not present

## 2016-06-09 DIAGNOSIS — S0990XA Unspecified injury of head, initial encounter: Secondary | ICD-10-CM | POA: Diagnosis present

## 2016-06-09 DIAGNOSIS — W19XXXA Unspecified fall, initial encounter: Secondary | ICD-10-CM

## 2016-06-09 DIAGNOSIS — I2510 Atherosclerotic heart disease of native coronary artery without angina pectoris: Secondary | ICD-10-CM | POA: Insufficient documentation

## 2016-06-09 MED ORDER — ACETAMINOPHEN 325 MG PO TABS
650.0000 mg | ORAL_TABLET | Freq: Once | ORAL | Status: AC
  Administered 2016-06-09: 650 mg via ORAL
  Filled 2016-06-09: qty 2

## 2016-06-09 NOTE — Discharge Instructions (Signed)
Her CT scan of her head and cervical spine do not show any acute changes. She can have acetaminophen for pain as needed. Ice packs will help with pain and swelling if she will allow it. Return to the ED for any problems listed on the head injury sheet.

## 2016-06-09 NOTE — ED Notes (Signed)
Cleaned up pt due to bowel movement and placed pt on bedpan call light in reach visitor at bedside.

## 2016-06-09 NOTE — ED Notes (Signed)
Bed: [id number] Expected date:  Expected time:  Means of arrival:  Comments: EMS 82yo F / back pain / knot to head

## 2016-06-09 NOTE — ED Notes (Signed)
Found pt walking around in room now visitor in room with pt, no respiratory or acute distress noted alert and talking confused at times trying to take off neck brace.

## 2016-06-09 NOTE — ED Provider Notes (Signed)
[facility] DEPT Provider Note   CSN: [id number] Arrival date & time: 06/09/16  0248  By signing my name below, I, Dolores Hoose, attest that this documentation has been prepared under the direction and in the presence of Rolland Porter, MD . Electronically Signed: Dolores Hoose, Scribe. 06/09/2016. 3:30 AM.  Time seen 03:29 AM  History   Chief Complaint Chief Complaint  Patient presents with  . Fall   The history is provided by a relative. No language interpreter was used.    LEVEL 5 CAVEAT DUE TO DEMENTIA  HPI Comments:  [name] [name] is a 82 y.o. female with PMHx of dementia brought in by ambulance who presents to the Emergency Department complaining of constant unchanged left sided head pain s/p fall earlier today. She lives in assisted living and was found by employees after her fall with a large knot on her left forehead. Pt's son notes that the pt has dementia and only remembers her sisters and him. Pt endorses chest pain that her son notes is baseline due to the way she sleeps. Pt's son also reports that the patient has an appointment today at [facility] to be seen for collarbone injuries sustained during another recent fall. Pt's son notes that the pt has been falling more since changing nursing homes 3 weeks ago, despite wearing a monitor to let staff know when she tries to get out of bed.     PCP Dr. Theadore Nan   Past Medical History:  Diagnosis Date  . Allergic rhinitis   . Anemia   . Angiodysplasia of colon with hemorrhage 05/2013  . Anxiety   . Arthritis   . COPD (chronic obstructive pulmonary disease) ([place])   . Dementia   . Depression   . DJD (degenerative joint disease)    Hands  . Gastritis   . GERD (gastroesophageal reflux disease)   . GI bleed   . Hemorrhoids   . Hiatal hernia    GERD  . Hypertension   . Iron deficiency anemia   . Osteoporosis   . Subdural hemorrhage ([place])   . Vertigo   . Vision abnormalities   . Vitamin B deficiency     . Vitamin D deficiency     Patient Active Problem List   Diagnosis Date Noted  . Alzheimer's disease 02/06/2016  . Hygroma 12/28/2014  . Gait disturbance 12/28/2014  . Agitation 12/28/2014  . Urinary incontinence 12/28/2014  . Diastolic dysfunction [id number]  . CAD (coronary artery disease) 09/02/2013  . IDA (iron deficiency anemia) 08/01/2013  . SOB (shortness of breath) 06/27/2013  . Angiodysplasia of colon 06/20/2013  . Diverticulosis of colon with hemorrhage 06/20/2013  . Unspecified gastritis and gastroduodenitis without mention of hemorrhage 06/19/2013  . COPD GOLD II  06/18/2013  . Chronic fatigue 06/18/2013  . DOE (dyspnea on exertion) 06/18/2013  . Anemia 06/17/2013  . Hypocalcemia 06/17/2013  . Arthritis   . Chest pain 12/03/2012  . Dementia 10/22/2012  . Multiple falls 10/19/2012  . Community acquired pneumonia 10/19/2012  . Displaced fracture of fifth metatarsal bone 10/19/2012  . Influenza A 09/18/2011  . Hypertension   . Depression     Past Surgical History:  Procedure Laterality Date  . COLONOSCOPY N/A 06/20/2013   Procedure: COLONOSCOPY;  Surgeon: Jerene Bears, MD;  Location: [facility];  Service: Gastroenterology;  Laterality: N/A;  . ESOPHAGOGASTRODUODENOSCOPY N/A 06/19/2013   Procedure: ESOPHAGOGASTRODUODENOSCOPY (EGD);  Surgeon: Juanita Craver, MD;  Location[facility] ENDOSCOPY;  Service: Endoscopy;  Laterality: N/A;  .  HOT HEMOSTASIS N/A 06/20/2013   Procedure: HOT HEMOSTASIS (ARGON PLASMA COAGULATION/BICAP);  Surgeon: Jerene Bears, MD;  Location: [facility];  Service: Gastroenterology;  Laterality: N/A;  . ORIF RADIAL FRACTURE Right 09/2007    OB History    No data available       Home Medications    Prior to Admission medications   Medication Sig Start Date End Date Taking? Authorizing Provider  acetaminophen (TYLENOL) 500 MG tablet Take 500 mg by mouth every 4 (four) hours as needed for mild pain, fever or headache.   Yes Historical Provider, MD   alum & mag hydroxide-simeth (MAALOX/MYLANTA) 200-200-20 MG/5ML suspension Take 30 mLs by mouth every 6 (six) hours as needed for indigestion or heartburn.   Yes Historical Provider, MD  aspirin 81 MG chewable tablet Chew 81 mg by mouth daily.   Yes Historical Provider, MD  bisacodyl (DULCOLAX) 5 MG EC tablet Take 5 mg by mouth every Monday, Wednesday, and Friday.   Yes Historical Provider, MD  Cholecalciferol (VITAMIN D) 2000 UNITS tablet Take 2,000 Units by mouth daily.   Yes Historical Provider, MD  diltiazem (CARDIZEM CD) 180 MG 24 hr capsule Take 1 capsule (180 mg total) by mouth daily. 09/02/13  Yes Sueanne Margarita, MD  donepezil (ARICEPT) 10 MG tablet Take 10 mg by mouth at bedtime.   Yes Historical Provider, MD  Ferrous Gluconate 325 (36 Fe) MG TABS Take 325 mg by mouth daily.   Yes Historical Provider, MD  guaiFENesin (ROBITUSSIN) 100 MG/5ML SOLN Take 200 mg by mouth every 6 (six) hours as needed for cough or to loosen phlegm.   Yes Historical Provider, MD  hydrOXYzine (ATARAX/VISTARIL) 25 MG tablet Take 25 mg by mouth 3 (three) times daily as needed for anxiety.   Yes Historical Provider, MD  Lidocaine (ASPERCREME LIDOCAINE) 4 % PTCH Place 1 patch onto the skin as directed. 1 patch applied to right clavicle every morning and remove in the evening.   Yes Historical Provider, MD  loperamide (IMODIUM) 2 MG capsule Take 2 mg by mouth 4 (four) times daily as needed for diarrhea or loose stools.   Yes Historical Provider, MD  loratadine (CLARITIN) 10 MG tablet Take 10 mg by mouth daily.    Yes Historical Provider, MD  magnesium hydroxide (MILK OF MAGNESIA) 400 MG/5ML suspension Take 30 mLs by mouth at bedtime as needed for mild constipation.   Yes Historical Provider, MD  neomycin-bacitracin-polymyxin (NEOSPORIN) OINT Apply 1 application topically daily as needed for wound care.   Yes Historical Provider, MD  nitroGLYCERIN (NITROSTAT) 0.4 MG SL tablet Place 1 tablet (0.4 mg total) under the tongue  every 5 (five) minutes as needed for chest pain. 12/04/12  Yes Bonnielee Haff, MD  pantoprazole (PROTONIX) 40 MG tablet Take 40 mg by mouth 2 (two) times daily.    Yes Historical Provider, MD  senna (SENOKOT) 8.6 MG TABS tablet Take 1 tablet by mouth daily.   Yes Historical Provider, MD  solifenacin (VESICARE) 5 MG tablet Take 5 mg by mouth daily.   Yes Historical Provider, MD  venlafaxine XR (EFFEXOR-XR) 75 MG 24 hr capsule Take 225 mg by mouth daily with breakfast.   Yes Historical Provider, MD  vitamin B-12 (CYANOCOBALAMIN) 1000 MCG tablet Take 1,000 mcg by mouth daily.   Yes Historical Provider, MD  HYDROcodone-acetaminophen (NORCO/VICODIN) 5-325 MG tablet Take 1 tablet by mouth every 6 (six) hours as needed. Patient not taking: Reported on 06/09/2016 06/01/16   Gloriann Loan, PA-C  risperiDONE (RISPERDAL) 0.25 MG tablet Take one po at lunch and one po at dinner Patient not taking: Reported on 06/01/2016 02/06/16   Britt Bottom, MD    Family History Family History  Problem Relation Age of Onset  . Diabetes Father   . Stroke Father   . Hypertension Father   . Atrial fibrillation Mother   . Hypertension Mother   . Anemia Mother   . CAD Maternal Grandfather   . Stomach cancer Paternal Grandmother     questionable    Social History Social History  Substance Use Topics  . Smoking status: Former Smoker    Packs/day: 1.00    Years: 15.00    Types: Cigarettes    Quit date: 09/22/1992  . Smokeless tobacco: Never Used  . Alcohol use No  lives in NH   Allergies   Sulfa antibiotics   Review of Systems Review of Systems  Unable to perform ROS: Dementia  Skin: Positive for wound.     Physical Exam Updated Vital Signs BP 189/99 (BP Location: Left Arm)   Pulse 85   Temp 98 F (36.7 C) (Oral)   Resp 18   Ht 5\' 4"  (1.626 m)   Wt 134 lb (60.8 kg)   SpO2 93%   BMI 23.00 kg/m   Vital signs normal except for hypertension  Physical Exam  Constitutional:  Non-toxic appearance.  She does not appear ill. No distress.  Frail elderly female,resting quietly  HENT:  Head: Normocephalic.  Right Ear: External ear normal.  Left Ear: External ear normal.  Nose: Nose normal. No mucosal edema or rhinorrhea.  Mouth/Throat: Mucous membranes are normal. No dental abscesses or uvula swelling.  Large contusion to left forehead that is tender to palpation.   Eyes: Conjunctivae and EOM are normal. Pupils are equal, round, and reactive to light.  Neck: Full passive range of motion without pain.  C collar in place  Cardiovascular: Regular rhythm and normal heart sounds.  Tachycardia present.  Exam reveals no gallop and no friction rub.   No murmur heard. Pulmonary/Chest: Effort normal and breath sounds normal. No respiratory distress. She has no wheezes. She has no rhonchi. She has no rales. She exhibits no tenderness and no crepitus.  Abdominal: Soft. Normal appearance and bowel sounds are normal. She exhibits no distension. There is no tenderness. There is no rebound and no guarding.  Musculoskeletal: Normal range of motion. She exhibits no edema or tenderness.  Neurological: She is alert. She has normal strength. No cranial nerve deficit.  Pt is sleeping, but awakens and follows simple commands.   Skin: Skin is warm, dry and intact. No rash noted. No erythema. No pallor.  No abrasions to extremities.  Psychiatric: She has a normal mood and affect. Her speech is normal and behavior is normal. Her mood appears not anxious.  Nursing note and vitals reviewed.      ED Treatments / Results    Radiology Ct Head Wo Contrast   Ct Cervical Spine Wo Contrast  Result Date: 06/09/2016 CLINICAL DATA:  82 y/o F; fell out of bed with contusion to the left forehead. History of dementia. EXAM: CT HEAD WITHOUT CONTRAST CT CERVICAL SPINE WITHOUT CONTRAST TECHNIQUE: Multidetector CT imaging of the head and cervical spine was performed following the standard protocol without intravenous  contrast. Multiplanar CT image reconstructions of the cervical spine were also generated. COMPARISON:  CT head and cervical spine 05/24/2016. FINDINGS: CT HEAD FINDINGS Brain: No evidence of acute infarction, hemorrhage,  hydrocephalus, extra-axial collection or mass lesion/mass effect. Stable chronic microvascular ischemic changes and parenchymal volume loss. Stable lucencies in bilateral lentiform nuclei and cerebellum probably representing chronic lacunar infarcts. Vascular: No hyperdense vessel. Moderate calcific atherosclerosis of cavernous internal carotid arteries per Skull: Left frontal scalp prominent soft tissue thickening compatible with contusion and hematoma. Negative for fracture or focal lesion. CT Sinuses/Orbits: No acute finding. Other: None. CT CERVICAL SPINE FINDINGS Alignment: Straightening of cervical lordosis. Stable C7-T1 grade 1 anterolisthesis. Skull base and vertebrae: No acute fracture. No primary bone lesion or focal pathologic process. No loss of vertebral body height. Soft tissues and spinal canal: No prevertebral fluid or swelling. No visible canal hematoma. Disc levels: Extensive cervical spondylosis greatest from C5 through C7 where there are severe disc space narrowing and marginal osteophytes. Multilevel moderate to severe facet arthrosis. No high-grade bony canal stenosis or foraminal narrowing. Upper chest: Several stable small nodules within the lung apices the largest in the right apex measuring 5 mm (series 7, image 62) of unlikely significance. Mild emphysema. Other: Aortic atherosclerosis. Normal thyroid gland. Tortuous common carotid artery is. Extensive calcific atherosclerosis of the carotid bifurcations bilaterally. No lymphadenopathy or discrete cervical mass is identified on this noncontrast CT. Patent aerodigestive tract. IMPRESSION: 1. Large left frontal scalp contusion.  No skull fracture. 2. No acute intracranial abnormality. 3. Stable chronic microvascular ischemic  changes and parenchymal volume loss of the brain. 4. No acute fracture or dislocation of the cervical spine. 5. Stable extensive degenerative changes of the cervical spine with prominent facet arthropathy. 6. Aortic atherosclerosis and mild emphysema of the lungs. Electronically Signed   By: Kristine Garbe M.D.   On: 06/09/2016 04:34    Procedures Procedures (including critical care time)  Medications Ordered in ED Medications  acetaminophen (TYLENOL) tablet 650 mg (not administered)     Initial Impression / Assessment and Plan / ED Course  I have reviewed the triage vital signs and the nursing notes.  Pertinent labs & imaging results that were available during my care of the patient were reviewed by me and considered in my medical decision making (see chart for details).  Clinical Course   COORDINATION OF CARE:  3:30 AM Discussed treatment plan with pt's son at bedside which included imaging of her head and cervical spine and pt agreed to plan. He states she is at her baseline so no other testing was done.   Son was given her results of her CT scan.   Final Clinical Impressions(s) / ED Diagnoses   Final diagnoses:  Fall at nursing home, initial encounter  Contusion of head, initial encounter    Plan discharge  Rolland Porter, MD, FACEP   I personally performed the services described in this documentation, which was scribed in my presence. The recorded information has been reviewed and considered.  Rolland Porter, MD, FACEPRolland Porter, MD 06/09/16 [phone]

## 2016-06-09 NOTE — ED Triage Notes (Signed)
Fall from bed with knot noted on forehead and c/o lower back pain with hx of dementia alert and looking around know who she is.

## 2016-06-09 NOTE — ED Notes (Signed)
Called report to [place] at [facility].

## 2016-08-07 ENCOUNTER — Ambulatory Visit: Payer: Medicare Other | Admitting: Neurology

## 2016-08-26 ENCOUNTER — Encounter: Payer: Self-pay | Admitting: Neurology

## 2016-08-26 ENCOUNTER — Ambulatory Visit (INDEPENDENT_AMBULATORY_CARE_PROVIDER_SITE_OTHER): Payer: Medicare Other | Admitting: Neurology

## 2016-08-26 VITALS — BP 122/75 | HR 72 | Ht 64.0 in | Wt 132.0 lb

## 2016-08-26 DIAGNOSIS — G301 Alzheimer's disease with late onset: Secondary | ICD-10-CM | POA: Diagnosis not present

## 2016-08-26 DIAGNOSIS — R269 Unspecified abnormalities of gait and mobility: Secondary | ICD-10-CM | POA: Diagnosis not present

## 2016-08-26 DIAGNOSIS — N3946 Mixed incontinence: Secondary | ICD-10-CM | POA: Diagnosis not present

## 2016-08-26 DIAGNOSIS — F0281 Dementia in other diseases classified elsewhere with behavioral disturbance: Secondary | ICD-10-CM

## 2016-08-26 DIAGNOSIS — R451 Restlessness and agitation: Secondary | ICD-10-CM | POA: Diagnosis not present

## 2016-08-26 DIAGNOSIS — F02818 Dementia in other diseases classified elsewhere, unspecified severity, with other behavioral disturbance: Secondary | ICD-10-CM

## 2016-08-26 NOTE — Progress Notes (Signed)
[facility]  PATIENT: [name] [name] DOB: 05-15-1929  REFERRING DOCTOR OR PCP:  Cari Caraway SOURCE: family and records  _________________________________   HISTORICAL  CHIEF COMPLAINT:  Chief Complaint  Patient presents with  . Alzheimer's Disease    She is here with her son, [name] [name], who feels her overall health has continued to decline.     HISTORY OF PRESENT ILLNESS:  [name] [name] is an 82 year old woman with dementia most consistent with late onset Alzheimer's disease.   She has cortical atrophy c/w Alzheimere's disease.    She is here accompanied by her son [name] [name].  She had 3 falls, breaking her collarbone (now ok) 6 months ago.   She has no falls the past 4-5 months.    Dementia/agitation.     She has had dementia x 3 years.    Her son feels that the memory difficulties have been slowly progressive since the last visit. If in an unfamiliar environment, she gets agitation.  She has been on Aricept since early 2016 and tolerates it well.   However, there has been no definite improvement of memory or other cognitive function   We checked CT of the head and cervical spine earlier this year.    She has chronic changes but no evidence of subdural hematomas or other acute changes. She has atrophy that is most pronounced in the mesial temporal lobes and parietal lobes.    Confusion:   She sleeps a lot during the day and sometimes has trouble sleeping at night.   She gets up most nights and then stays awake.  In the evenings, she has more confusion and delusions.   She is having some agitation in the evening despite vistaril.    She is overly talkative and even talks in her sleep.  Gait:    Her gait is less shuffling than a few months ago.     She uses a Rollator and gets around well for the most part (or holds son's arm).   There was no evidence of normal pressure hydrocephalus on CT scan.     Bladder:   Additionally, she is having difficulty with her bladder and  uses Depends undergarments.   Her son is not sure she gets any benefit from [place].  We discussed that the bladder medications may worsen cognition.      REVIEW OF SYSTEMS: Constitutional: No fevers, chills, sweats, or change in appetite Eyes: No visual changes, double vision, eye pain Ear, nose and throat: No hearing loss, ear pain, nasal congestion, sore throat Cardiovascular: No chest pain, palpitations Respiratory: No shortness of breath at rest or with exertion.   No wheezes GastrointestinaI: No nausea, vomiting, diarrhea, abdominal pain, fecal incontinence Genitourinary: No dysuria, urinary retention or frequency.  No nocturia. Musculoskeletal: No neck pain, back pain Integumentary: No rash, pruritus, skin lesions Neurological: as above Psychiatric: No depression at this time.  No anxiety Endocrine: No palpitations, diaphoresis, change in appetite, change in weigh or increased thirst Hematologic/Lymphatic: No anemia, purpura, petechiae. Allergic/Immunologic: No itchy/runny eyes, nasal congestion, recent allergic reactions, rashes  ALLERGIES: Allergies  Allergen Reactions  . Sulfa Antibiotics Itching    No confirmation from patients family or MAR from facility    HOME MEDICATIONS:  Current Outpatient Prescriptions:  .  acetaminophen (TYLENOL) 500 MG tablet, Take 500 mg by mouth every 4 (four) hours as needed for mild pain, fever or headache., Disp: , Rfl:  .  alum & mag hydroxide-simeth (MAALOX/MYLANTA) [id number] MG/5ML suspension, Take 30  mLs by mouth every 6 (six) hours as needed for indigestion or heartburn., Disp: , Rfl:  .  aspirin 81 MG chewable tablet, Chew 81 mg by mouth daily., Disp: , Rfl:  .  bisacodyl (DULCOLAX) 5 MG EC tablet, Take 5 mg by mouth every Monday, Wednesday, and Friday., Disp: , Rfl:  .  Cholecalciferol (VITAMIN D) 2000 UNITS tablet, Take 2,000 Units by mouth daily., Disp: , Rfl:  .  diltiazem (CARDIZEM CD) 180 MG 24 hr capsule, Take 1 capsule  (180 mg total) by mouth daily., Disp: 30 capsule, Rfl: 11 .  donepezil (ARICEPT) 10 MG tablet, Take 10 mg by mouth at bedtime., Disp: , Rfl:  .  Ferrous Gluconate 325 (36 Fe) MG TABS, Take 325 mg by mouth daily., Disp: , Rfl:  .  guaiFENesin (ROBITUSSIN) 100 MG/5ML SOLN, Take 200 mg by mouth every 6 (six) hours as needed for cough or to loosen phlegm., Disp: , Rfl:  .  HYDROcodone-acetaminophen (NORCO/VICODIN) 5-325 MG tablet, Take 1 tablet by mouth every 6 (six) hours as needed., Disp: 12 tablet, Rfl: 0 .  hydrOXYzine (ATARAX/VISTARIL) 25 MG tablet, Take 25 mg by mouth 3 (three) times daily as needed for anxiety., Disp: , Rfl:  .  Lidocaine (ASPERCREME LIDOCAINE) 4 % PTCH, Place 1 patch onto the skin as directed. 1 patch applied to right clavicle every morning and remove in the evening., Disp: , Rfl:  .  loperamide (IMODIUM) 2 MG capsule, Take 2 mg by mouth 4 (four) times daily as needed for diarrhea or loose stools., Disp: , Rfl:  .  loratadine (CLARITIN) 10 MG tablet, Take 10 mg by mouth daily. , Disp: , Rfl:  .  magnesium hydroxide (MILK OF MAGNESIA) 400 MG/5ML suspension, Take 30 mLs by mouth at bedtime as needed for mild constipation., Disp: , Rfl:  .  neomycin-bacitracin-polymyxin (NEOSPORIN) OINT, Apply 1 application topically daily as needed for wound care., Disp: , Rfl:  .  nitroGLYCERIN (NITROSTAT) 0.4 MG SL tablet, Place 1 tablet (0.4 mg total) under the tongue every 5 (five) minutes as needed for chest pain., Disp: 30 tablet, Rfl: 0 .  pantoprazole (PROTONIX) 40 MG tablet, Take 40 mg by mouth 2 (two) times daily. , Disp: , Rfl:  .  risperiDONE (RISPERDAL) 0.25 MG tablet, Take one po at lunch and one po at dinner, Disp: 60 tablet, Rfl: 5 .  senna (SENOKOT) 8.6 MG TABS tablet, Take 1 tablet by mouth daily., Disp: , Rfl:  .  solifenacin (VESICARE) 5 MG tablet, Take 5 mg by mouth daily., Disp: , Rfl:  .  venlafaxine XR (EFFEXOR-XR) 75 MG 24 hr capsule, Take 225 mg by mouth daily with  breakfast., Disp: , Rfl:  .  vitamin B-12 (CYANOCOBALAMIN) 1000 MCG tablet, Take 1,000 mcg by mouth daily., Disp: , Rfl:   PAST MEDICAL HISTORY: Past Medical History:  Diagnosis Date  . Allergic rhinitis   . Anemia   . Angiodysplasia of colon with hemorrhage 05/2013  . Anxiety   . Arthritis   . COPD (chronic obstructive pulmonary disease) ([place])   . Dementia   . Depression   . DJD (degenerative joint disease)    Hands  . Gastritis   . GERD (gastroesophageal reflux disease)   . GI bleed   . Hemorrhoids   . Hiatal hernia    GERD  . Hypertension   . Iron deficiency anemia   . Osteoporosis   . Subdural hemorrhage ([place])   . Vertigo   .  Vision abnormalities   . Vitamin B deficiency   . Vitamin D deficiency     PAST SURGICAL HISTORY: Past Surgical History:  Procedure Laterality Date  . COLONOSCOPY N/A 06/20/2013   Procedure: COLONOSCOPY;  Surgeon: Jerene Bears, MD;  Location: [facility];  Service: Gastroenterology;  Laterality: N/A;  . ESOPHAGOGASTRODUODENOSCOPY N/A 06/19/2013   Procedure: ESOPHAGOGASTRODUODENOSCOPY (EGD);  Surgeon: Juanita Craver, MD;  [facility] END[facility]COPY;  Service: Endoscopy;  Laterality: N/A;  . HOT HEMOSTASIS N/A 06/20/2013   Procedure: HOT HEMOSTASIS (ARGON PLASMA COAGULATION/BICAP);  Surgeon: Jerene Bears, MD;  Location: [facility];  Service: Gastroenterology;  Laterality: N/A;  . ORIF RADIAL FRACTURE Right 09/2007    FAMILY HISTORY: Family History  Problem Relation Age of Onset  . Diabetes Father   . Stroke Father   . Hypertension Father   . Atrial fibrillation Mother   . Hypertension Mother   . Anemia Mother   . CAD Maternal Grandfather   . Stomach cancer Paternal Grandmother     questionable    SOCIAL HISTORY:  Social History   Social History  . Marital status: Widowed    Spouse name: N/A  . Number of children: N/A  . Years of education: N/A   Occupational History  . Not on file.   Social History Main Topics  . Smoking status:  Former Smoker    Packs/day: 1.00    Years: 15.00    Types: Cigarettes    Quit date: 09/22/1992  . Smokeless tobacco: Never Used  . Alcohol use No  . Drug use: No  . Sexual activity: Not on file   Other Topics Concern  . 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  Lives independently and able to get around without a cane or walker, still quite healthy and active. Has two sisters who are involved. One son, two grandchildren. Her son currently has a brain tumor, which is causing the patient stress. Sylvester Harder, sister, [phone]. She quit smoking and drinking about 20 yrs ago.      PHYSICAL EXAM  Vitals:   08/26/16 1433  BP: 122/75  Pulse: 72  Weight: 132 lb (59.9 kg)  Height: 5\' 4"  (1.626 m)    Body mass index is 22.66 kg/m.   General: The patient is well-developed and well-nourished and in no acute distress  HEENT:   [place]/AT, reduced hearing, neck good ROM   Neurologic Exam  Mental status: The patient is alert and oriented to name but not date or place. Attention is reduced.  She is very distractible.   She knows left from right and can follow most 1 step and some 2 step commands but not 3 steps.Cannot do math and STM is 0/3 at 3 minutes.   Speech is normal.  Cranial nerves: Extraocular movements are full.  Facial symmetry is present. There is good facial sensation to soft touch bilaterally.Facial strength is normal.  Trapezius and sternocleidomastoid strength is normal. No dysarthria is noted.  The tongue is midline, and the patient has symmetric elevation of the soft palate.    Reduced hearing bilaterally  Motor:  She has slight tremor with intention not resting.  Muscle bulk is normal.   Tone is increased (cogwheel). Strength is  5 / 5 in all 4 extremities.   Sensory: Sensory testing is intact to touch in all 4 extremities.  Coordination: Cerebellar testing reveals good finger-nose-finger bilaterally.  Gait and  station: Station  is normal.   Gait is wide-based with a mildly reduced stride and she take 7 steps to turn 180. She cannot tandem walk.  Reflexes: Deep tendon reflexes are symmetric and normal bilaterally.      DIAGNOSTIC DATA (LABS, IMAGING, TESTING) - I reviewed patient records, labs, notes, testing and imaging myself where available.  Lab Results  Component Value Date   WBC 24.8 (H) 01/10/2015   HGB 12.3 01/10/2015   HCT 38.7 01/10/2015   MCV 90.8 01/10/2015   PLT 350 01/10/2015      Component Value Date/Time   NA 139 01/10/2015 0840   NA 142 12/28/2014 1513   K 3.8 01/10/2015 0840   CL 102 01/10/2015 0840   CO2 25 01/10/2015 0840   GLUCOSE 119 (H) 01/10/2015 0840   BUN 14 01/10/2015 0840   BUN 13 12/28/2014 1513   CREATININE 1.00 01/10/2015 0840   CALCIUM 8.7 01/10/2015 0840   PROT 6.6 12/28/2014 1513   ALBUMIN 4.3 12/28/2014 1513   AST 11 12/28/2014 1513   ALT 9 12/28/2014 1513   ALKPHOS 106 12/28/2014 1513   BILITOT 0.2 12/28/2014 1513   GFRNONAA 51 (L) 01/10/2015 0840   GFRAA 59 (L) 01/10/2015 0840    Lab Results  Component Value Date   VITAMINB12 861 02/27/2015   Lab Results  Component Value Date   TSH 2.170 02/27/2015       ASSESSMENT AND PLAN  Late onset Alzheimer's disease with behavioral disturbance  Gait disturbance  Agitation  Mixed stress and urge urinary incontinence   1.  Continue Aricept.   2.  Stop Vesicare and loratadine to see if confusion improves.  For now continues hydroxyzine as it seems to have helped agitation 3.  She should try to remain active and continue to engage in activities with others. 4.   She does have some cogwheel increased muscle tone but does not have bradykinesia and her gait is no worse than the last visit. I can't rule out that she has Lewy body disease rather than Alzheimer's.    She also could have an early Parkinson's that might declare itself more over the next few months. The son is advised to call me  if gait worsens. Return in 6 months or sooner if she has new or worsening neurologic symptoms.   Richard A. Felecia Shelling, MD, PhD [id number], [id number] PM Certified in Neurology, Clinical Neurophysiology, Sleep Medicine, Pain Medicine and Neuroimaging  [facility] [address], [place], [place] [address] [phone]   \

## 2016-08-26 NOTE — Patient Instructions (Addendum)
1.   Stop Vesicare 2.   Strop Loratadine   Return in 6 months

## 2016-12-11 ENCOUNTER — Telehealth: Payer: Self-pay | Admitting: Neurology

## 2016-12-11 NOTE — Telephone Encounter (Signed)
I have spoken with [name] [name] this morning to express Dr. Garth Bigness and my condolences on the loss of his mother./fim

## 2016-12-11 NOTE — Telephone Encounter (Signed)
Patient's son Elta Gu[place] called to advise the patient passed away on 28-Dec-2016.

## 2016-12-21 DEATH — deceased

## 2017-02-24 ENCOUNTER — Ambulatory Visit: Payer: Medicare Other | Admitting: Neurology
